# Patient Record
Sex: Female | Born: 1945 | Race: White | Hispanic: No
Health system: Southern US, Community
[De-identification: ages and names within clinical notes are randomized; demographics above are authoritative.]

## PROBLEM LIST (undated history)

## (undated) DIAGNOSIS — I2510 Atherosclerotic heart disease of native coronary artery without angina pectoris: Secondary | ICD-10-CM

## (undated) DIAGNOSIS — K219 Gastro-esophageal reflux disease without esophagitis: Secondary | ICD-10-CM

## (undated) DIAGNOSIS — D649 Anemia, unspecified: Secondary | ICD-10-CM

## (undated) DIAGNOSIS — C569 Malignant neoplasm of unspecified ovary: Secondary | ICD-10-CM

## (undated) DIAGNOSIS — I10 Essential (primary) hypertension: Secondary | ICD-10-CM

## (undated) DIAGNOSIS — E119 Type 2 diabetes mellitus without complications: Secondary | ICD-10-CM

## (undated) HISTORY — DX: Type 2 diabetes mellitus without complications: E11.9

## (undated) HISTORY — DX: Anemia, unspecified: D64.9

## (undated) HISTORY — DX: Atherosclerotic heart disease of native coronary artery without angina pectoris: I25.10

## (undated) HISTORY — DX: Essential (primary) hypertension: I10

## (undated) HISTORY — DX: Gastro-esophageal reflux disease without esophagitis: K21.9

## (undated) HISTORY — DX: Malignant neoplasm of unspecified ovary: C56.9

---

## 2015-04-24 ENCOUNTER — Ambulatory Visit: Payer: Self-pay | Admitting: Allergy and Immunology

## 2015-05-22 ENCOUNTER — Ambulatory Visit: Payer: Medicare Other | Admitting: Allergy and Immunology

## 2020-12-17 DIAGNOSIS — R1900 Intra-abdominal and pelvic swelling, mass and lump, unspecified site: Secondary | ICD-10-CM | POA: Insufficient documentation

## 2020-12-17 DIAGNOSIS — R971 Elevated cancer antigen 125 [CA 125]: Secondary | ICD-10-CM | POA: Insufficient documentation

## 2020-12-17 DIAGNOSIS — I10 Essential (primary) hypertension: Secondary | ICD-10-CM | POA: Insufficient documentation

## 2020-12-17 DIAGNOSIS — K219 Gastro-esophageal reflux disease without esophagitis: Secondary | ICD-10-CM | POA: Insufficient documentation

## 2020-12-17 DIAGNOSIS — E119 Type 2 diabetes mellitus without complications: Secondary | ICD-10-CM | POA: Insufficient documentation

## 2020-12-17 DIAGNOSIS — I779 Disorder of arteries and arterioles, unspecified: Secondary | ICD-10-CM | POA: Insufficient documentation

## 2020-12-20 DIAGNOSIS — D62 Acute posthemorrhagic anemia: Secondary | ICD-10-CM | POA: Insufficient documentation

## 2020-12-21 DIAGNOSIS — R531 Weakness: Secondary | ICD-10-CM | POA: Insufficient documentation

## 2020-12-21 DIAGNOSIS — R6889 Other general symptoms and signs: Secondary | ICD-10-CM | POA: Insufficient documentation

## 2021-01-07 DIAGNOSIS — C562 Malignant neoplasm of left ovary: Secondary | ICD-10-CM | POA: Insufficient documentation

## 2021-01-29 DIAGNOSIS — Z1379 Encounter for other screening for genetic and chromosomal anomalies: Secondary | ICD-10-CM | POA: Insufficient documentation

## 2021-01-29 NOTE — Progress Notes (Signed)
[address], [place] [address]   CLINIC:  Medical Oncology/Hematology  CONSULT NOTE  Patient Care Team: Derek Jack, MD as Medical Oncologist (Medical Oncology) Brien Mates, RN as Oncology Nurse Navigator (Oncology) [place], Tawni Pummel, NP as Nurse Practitioner (Cardiothoracic Surgery)  CHIEF COMPLAINTS/PURPOSE OF CONSULTATION:  Evaluation of ovarian cancer  HISTORY OF PRESENTING ILLNESS:  [name] 75 y.o. female is here because of evaluation of ovarian cancer, at the request of Dr. Denman George.  Today she reports feeling well, and she is accompanied by her daughter-in-law. She reports back pain over the past 3-4 days which is worsened with movement, but it is unchanged upon inspiration. She underwent exploratory laparotomy, BSO, and infra-gastric omentectomy on 12/19/2020 with Dr. Denman George. She currently undergoes physical therapy twice weekly, and she is expected to complete physical therapy in 2 weeks. She is able to walk with the assistance of a cane. She reports abdominal pain and swelling at the site of her abdominal surgical scar. She denies previous personal history of cancer. She reports occasional numbness in her hands. She denies history of MI and CVA. She reports swellings in her ankles.   She currently lives at home alone, and she is able to do all of her typical home activities unassisted. Prior to retirement she worked at [employer] in [place]. She denies extensive smoking history. Her brother passed from prostate cancer, another brother has throat cancer, her grandson had bladder cancer at 58 years old, her paternal uncle was a smoker and had neck cancer.   MEDICAL HISTORY:  Past Medical History:  Diagnosis Date   Anemia    CAD (coronary artery disease)    DM2 (diabetes mellitus, type 2) (HCC)    GERD (gastroesophageal reflux disease)    HTN (hypertension)    Ovarian ca ([place])     SURGICAL HISTORY: History  reviewed. No pertinent surgical history.  SOCIAL HISTORY: Social History   Socioeconomic History   Marital status: Single    Spouse name: Not on file   Number of children: Not on file   Years of education: Not on file   Highest education level: Not on file  Occupational History   Not on file  Tobacco Use   Smoking status: Not on file   Smokeless tobacco: Not on file  Substance and Sexual Activity   Alcohol use: Not on file   Drug use: Not on file   Sexual activity: Not on file  Other Topics Concern   Not on file  Social History Narrative   Not on file   Social Determinants of Health   Financial Resource Strain: Not on file  Food Insecurity: Not on file  Transportation Needs: Not on file  Physical Activity: Not on file  Stress: Not on file  Social Connections: Not on file  Intimate Partner Violence: Not on file    FAMILY HISTORY: History reviewed. No pertinent family history.  ALLERGIES:  is allergic to aspirin, iodinated diagnostic agents, naproxen sodium, penicillin g, codeine, and ibuprofen.  MEDICATIONS:  Current Outpatient Medications  Medication Sig Dispense Refill   acetaminophen (TYLENOL) 325 MG tablet Take 650 mg by mouth every 6 (six) hours as needed.     atorvastatin (LIPITOR) 40 MG tablet Take 40 mg by mouth daily.     azelastine (ASTELIN) 0.1 % nasal spray Place 1 spray into the nose in the morning and at bedtime.     baclofen (LIORESAL) 10 MG tablet Take 5 mg  by mouth in the morning, at noon, and at bedtime.     clopidogrel (PLAVIX) 75 MG tablet Take 75 mg by mouth daily.     furosemide (LASIX) 20 MG tablet Take 20 mg by mouth daily as needed.     insulin degludec (TRESIBA FLEXTOUCH) 100 UNIT/ML FlexTouch Pen Inject 19 Units into the skin at bedtime.     predniSONE (DELTASONE) 50 MG tablet Take 13hrs, 7hrs and 1hr before port placement 3 tablet 0   BANOPHEN 25 MG capsule Take by mouth.     FARXIGA 10 MG TABS tablet Take 10 mg by mouth daily.      ferrous sulfate 325 (65 FE) MG tablet Take 325 mg by mouth daily.     JANUVIA 100 MG tablet Take 100 mg by mouth daily.     losartan (COZAAR) 50 MG tablet Take 50 mg by mouth daily.     metFORMIN (GLUCOPHAGE) 1000 MG tablet Take 1,000 mg by mouth daily.     mometasone (ELOCON) 0.1 % cream SMARTSIG:Sparingly Topical Daily     MYRBETRIQ 50 MG TB24 tablet Take 50 mg by mouth daily.     oxyCODONE (OXY IR/ROXICODONE) 5 MG immediate release tablet Take 5 mg by mouth 4 (four) times daily as needed.     pantoprazole (PROTONIX) 20 MG tablet Take 20 mg by mouth daily.     traMADol (ULTRAM) 50 MG tablet Take by mouth.     triamcinolone cream (KENALOG) 0.1 % APPLY TO AFFECTED AREA TWICE A DAY FOR 7 DAYS     No current facility-administered medications for this visit.    REVIEW OF SYSTEMS:   Review of Systems  Cardiovascular:  Positive for leg swelling.  Gastrointestinal:  Positive for abdominal pain.  Musculoskeletal:  Positive for back pain.  Neurological:  Positive for numbness (occasional in hands).  All other systems reviewed and are negative.   PHYSICAL EXAMINATION: ECOG PERFORMANCE STATUS: 1 - Symptomatic but completely ambulatory  Vitals:   01/30/21 1410  BP: (!) 157/75  Pulse: 83  Resp: 17  Temp: (!) 96.8 F (36 C)  SpO2: 100%   Filed Weights   01/30/21 1410  Weight: 180 lb 12.8 oz (82 kg)   Physical Exam Vitals reviewed.  Constitutional:      Appearance: Normal appearance. She is obese.  Cardiovascular:     Rate and Rhythm: Normal rate and regular rhythm.     Pulses: Normal pulses.     Heart sounds: Normal heart sounds.  Pulmonary:     Effort: Pulmonary effort is normal.     Breath sounds: Normal breath sounds.  Abdominal:     General: A surgical scar is present.     Palpations: There is no mass.     Comments: Surgical scar extending below umbilicus to suprapubic region  Infraumbilical hematoma extending 6 cm in midline and 3 cm width   Musculoskeletal:     Right  lower leg: Edema (trace) present.     Left lower leg: Edema (trace) present.     Comments: Site of pain: upper left posterior ribs medial to left scapula  Neurological:     General: No focal deficit present.     Mental Status: She is alert and oriented to person, place, and time.  Psychiatric:        Mood and Affect: Mood normal.        Behavior: Behavior normal.     LABORATORY DATA:  I have reviewed the data as listed No  flowsheet data found. No flowsheet data found.  RADIOGRAPHIC STUDIES: I have personally reviewed the radiological images as listed and agreed with the findings in the report. No results found.  ASSESSMENT:  Stage IIIb high-grade serous ovarian carcinoma: - Presentation with abdominal distention and bloating sensation. - CT abdomen at outside hospital on 12/05/2020 with findings concerning for ovarian neoplasm. - CT chest without contrast on 12/18/2020 with scattered solid pulmonary nodules measuring up to 3 mm, indeterminate. - CA125 on 12/05/2020-10000 - 12/18/2020 exploratory laparotomy, bilateral salpingo-oophorectomy, infra gastric omentectomy, R0 primary tumor debulking by Dr. Denman George at [facility]. - Pathology-left ovary high-grade serous carcinoma, greatest dimension 26.5 cm, left ovarian capsule intact.  Fallopian tube surface involvement present, omentum involvement present, largest extrapelvic peritoneal focus-macroscopic, peritoneal/ascitic fluid involvement negative.  PT3BPNX.  FIGO stage IIIb. - She was evaluated for FLORA 5 clinical trial, found not to be a candidate. - Germline mutation testing was negative.   Social/family history: - She is seen with her daughter-in-law today.  She lives by herself.  She ambulates with the help of cane.  She retired after working at [employer].  She is non-smoker. - Brother had prostate cancer.  Another brother had throat cancer.  Sister had kidney cancer.  Grandson had bladder cancer at age 65.  Paternal uncle had  head and neck cancer.   PLAN:  Stage IIIb high-grade serous ovarian carcinoma: - She is recovering well after debulking surgery. - She reported some soreness at the incision site, mostly towards the right side and felt that hematoma has gotten slightly bigger. - She is having a CT scan done in [place] tomorrow to further evaluate this. - She also reported upper back pain for the last 1 week.  She has a tenderness medial to the left scapula which hurts on certain movements.  No pain on deep inspiration.  This is likely musculoskeletal pain. - She reports doing physical therapy twice weekly and has 2 more weeks to complete. - We discussed adjuvant chemotherapy to decrease chance of recurrence given high-grade serous histology.  Unfortunately she was not eligible for clinical trial.  Hence we will proceed with carboplatin and paclitaxel every 3 weeks for 6 cycles with or without maintenance therapy. - She was already tested negative for germline mutation testing.  We will consider testing for somatic mutations. - We will recommend port placement as soon as possible and initiate chemotherapy immediately after port placement. - She does not report any baseline neuropathy.  She has diabetes for 7 years.  2.  Carotid artery disease: - He underwent carotid endarterectomy in 2021.  She is on Plavix 75 mg daily.  3.  Type 2 diabetes: - Currently on metformin, Januvia and Tresiba.   All questions were answered. The patient knows to call the clinic with any problems, questions or concerns.   Derek Jack, MD, 01/30/21 4:59 PM  [place] [phone]   I, Thana Ates, am acting as a scribe for Dr. Derek Jack.  I, Derek Jack MD, have reviewed the above documentation for accuracy and completeness, and I agree with the above.

## 2021-01-30 ENCOUNTER — Encounter (HOSPITAL_COMMUNITY): Payer: Self-pay | Admitting: Hematology

## 2021-01-30 ENCOUNTER — Inpatient Hospital Stay (HOSPITAL_COMMUNITY): Payer: Medicare Other | Attending: Hematology | Admitting: Hematology

## 2021-01-30 ENCOUNTER — Other Ambulatory Visit: Payer: Self-pay

## 2021-01-30 VITALS — BP 157/75 | HR 83 | Temp 96.8°F | Resp 17 | Ht 61.0 in | Wt 180.8 lb

## 2021-01-30 DIAGNOSIS — Z79899 Other long term (current) drug therapy: Secondary | ICD-10-CM | POA: Diagnosis not present

## 2021-01-30 DIAGNOSIS — I2510 Atherosclerotic heart disease of native coronary artery without angina pectoris: Secondary | ICD-10-CM | POA: Diagnosis not present

## 2021-01-30 DIAGNOSIS — Z7902 Long term (current) use of antithrombotics/antiplatelets: Secondary | ICD-10-CM | POA: Diagnosis not present

## 2021-01-30 DIAGNOSIS — E119 Type 2 diabetes mellitus without complications: Secondary | ICD-10-CM | POA: Insufficient documentation

## 2021-01-30 DIAGNOSIS — I779 Disorder of arteries and arterioles, unspecified: Secondary | ICD-10-CM | POA: Diagnosis not present

## 2021-01-30 DIAGNOSIS — C562 Malignant neoplasm of left ovary: Secondary | ICD-10-CM

## 2021-01-30 MED ORDER — PREDNISONE 50 MG PO TABS: ORAL_TABLET | ORAL | 0 refills | Status: DC

## 2021-01-30 NOTE — Patient Instructions (Signed)
[place] at [facility] Discharge Instructions  You were seen and examined today by Dr. Delton Coombes. Dr. Delton Coombes is a medical oncologist, meaning that he specializes in the management of cancer diagnoses with medications. Dr. Delton Coombes discussed your past medical history, family history of cancer, and the events that led to you being here today.  You were referred to Dr. Delton Coombes by Dr. Denman George due to your new diagnosis of Ovarian Cancer. It is considered a Stage Cancer, and when examined under a microscope it was identified as a high-grade serous carcinoma. It is recommended that you have 6 cycles of chemotherapy.  Chemotherapy would be given here in the clinic once every 3 weeks.   Thank you for choosing [place] at [facility] to provide your oncology and hematology care.  To afford each patient quality time with our provider, please arrive at least 15 minutes before your scheduled appointment time.   If you have a lab appointment with the [facility] please come in thru the Main Entrance and check in at the main information desk.  You need to re-schedule your appointment should you arrive 10 or more minutes late.  We strive to give you quality time with our providers, and arriving late affects you and other patients whose appointments are after yours.  Also, if you no show three or more times for appointments you may be dismissed from the clinic at the providers discretion.     Again, thank you for choosing [facility].  Our hope is that these requests will decrease the amount of time that you wait before being seen by our physicians.       _____________________________________________________________  Should you have questions after your visit to [facility], please contact our office at [phone] and follow the prompts.  Our office hours are 8:00 a.m. and 4:30 p.m. Monday - Friday.  Please note that  voicemails left after 4:00 p.m. may not be returned until the following business day.  We are closed weekends and major holidays.  You do have access to a nurse 24-7, just call the main number to the clinic [phone] and do not press any options, hold on the line and a nurse will answer the phone.    For prescription refill requests, have your pharmacy contact our office and allow 72 hours.    Due to Covid, you will need to wear a mask upon entering the hospital. If you do not have a mask, a mask will be given to you at the Main Entrance upon arrival. For doctor visits, patients may have 1 support person age 22 or older with them. For treatment visits, patients can not have anyone with them due to social distancing guidelines and our immunocompromised population.

## 2021-01-30 NOTE — Progress Notes (Signed)
START ON PATHWAY REGIMEN - Ovarian     A cycle is every 21 days:     Paclitaxel      Carboplatin   **Always confirm dose/schedule in your pharmacy ordering system**  Patient Characteristics: Postoperative without Neoadjuvant Therapy (Pathologic Staging), Newly Diagnosed, Adjuvant Therapy, Stage IIB and Stage IIIA/B/C Optimal Cytoreduction BRCA Mutation Status: Awaiting Test Results Therapeutic Status: Postoperative without Neoadjuvant Therapy (Pathologic Staging) AJCC 8 Stage Grouping: IIIB AJCC M Category: cM0 AJCC T Category: pT3b AJCC N Category: pNX Intent of Therapy: Curative Intent, Discussed with Patient

## 2021-02-02 ENCOUNTER — Encounter (HOSPITAL_COMMUNITY): Payer: Self-pay

## 2021-02-02 ENCOUNTER — Encounter (HOSPITAL_COMMUNITY): Payer: Self-pay | Admitting: Hematology

## 2021-02-02 DIAGNOSIS — Z95828 Presence of other vascular implants and grafts: Secondary | ICD-10-CM

## 2021-02-02 HISTORY — DX: Presence of other vascular implants and grafts: Z95.828

## 2021-02-02 MED ORDER — LIDOCAINE-PRILOCAINE 2.5-2.5 % EX CREA: TOPICAL_CREAM | CUTANEOUS | 3 refills | Status: DC

## 2021-02-02 MED ORDER — PROCHLORPERAZINE MALEATE 10 MG PO TABS: 10.0000 mg | ORAL_TABLET | Freq: Four times a day (QID) | ORAL | 1 refills | Status: DC | PRN

## 2021-02-02 NOTE — Patient Instructions (Signed)
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  You will be treated in the clinic every 3 weeks with a combination of chemotherapy drugs.  Those drugs are paclitaxel (Taxol) and carboplatin.  The plan is for you to complete 6 cycles (treatment every 3 weeks x 6).  The intent of treatment is cure.  You will see the doctor regularly throughout treatment.  We will obtain blood work from you prior to every treatment and monitor your results to make sure it is safe to give your treatment. The doctor monitors your response to treatment by the way you are feeling, your blood work, and by obtaining scans periodically.  There will be wait times while you are here for treatment.  It will take about 30 minutes to 1 hour for your lab work to result.  Then there will be wait times while pharmacy mixes your medications.    Medications you will receive in the clinic prior to your chemotherapy medications:   Aloxi:  ALOXI is used in adults to help prevent the nausea and vomiting that happens with certain chemotherapy drugs.  Aloxi is a long acting medication, and will remain in your system for about 2 days.    Dexamethasone:  This is a steroid given prior to chemotherapy to help prevent allergic reactions; it may also help prevent and control nausea and diarrhea.    Pepcid:  This medication is a histamine blocker that helps prevent and allergic reaction to your chemotherapy.    Benadryl:  This is a histamine blocker (different from the Pepcid) that helps prevent allergic/infusion reactions to your chemotherapy. This medication may cause dizziness/drowsiness.      Paclitaxel (Taxol)  About This Drug Paclitaxel is a drug used to treat cancer. It is given in the vein (IV).  This will take 3 hours to infuse.  This first infusion will take longer to infuse because it is increased slowly to monitor for reactions.  The nurse will be in  the room with you for the first 15 minutes of the first infusion.  Possible Side Effects   Hair loss. Hair loss is often temporary, although with certain medicine, hair loss can sometimes be permanent. Hair loss may happen suddenly or gradually. If you lose hair, you may lose it from your head, face, armpits, pubic area, chest, and/or legs. You may also notice your hair getting thin.   Swelling of your legs, ankles and/or feet (edema)   Flushing   Nausea and throwing up (vomiting)   Loose bowel movements (diarrhea)   Bone marrow depression. This is a decrease in the number of white blood cells, red blood cells, and platelets. This may raise your risk of infection, make you tired and weak (fatigue), and raise your risk of bleeding.   Effects on the nerves are called peripheral neuropathy. You may feel numbness, tingling, or pain in your hands and feet. It may be hard for you to button your clothes, open jars, or walk as usual. The effect on the nerves may get worse with more doses of the drug. These effects get better in some people after the drug is stopped but it does not get better in all people.   Changes in your liver function   Bone, joint and muscle pain   Abnormal EKG   Allergic reaction: Allergic reactions, including anaphylaxis are rare but may happen in some patients. Signs of allergic reaction to this drug  may be swelling of the face, feeling like your tongue or throat are swelling, trouble breathing, rash, itching, fever, chills, feeling dizzy, and/or feeling that your heart is beating in a fast or not normal way. If this happens, do not take another dose of this drug. You should get urgent medical treatment.   Infection   Changes in your kidney function.  Note: Each of the side effects above was reported in 20% or greater of patients treated with paclitaxel. Not all possible side effects are included above.  Warnings and Precautions   Severe allergic reactions    Severe bone marrow depression  Treating Side Effects   To help with hair loss, wash with a mild shampoo and avoid washing your hair every day.   Avoid rubbing your scalp, instead, pat your hair or scalp dry   Avoid coloring your hair   Limit your use of hair spray, electric curlers, blow dryers, and curling irons.   If you are interested in getting a wig, talk to your nurse. You can also call the [facility] at 800-ACS-2345 to find out information about the "Look Good, Feel Better" program close to where you live. It is a free program where women getting chemotherapy can learn about wigs, turbans and scarves as well as makeup techniques and skin and nail care.   Ask your doctor or nurse about medicines that are available to help stop or lessen diarrhea and/or nausea.   To help with nausea and vomiting, eat small, frequent meals instead of three large meals a day. Choose foods and drinks that are at room temperature. Ask your nurse or doctor about other helpful tips and medicine that is available to help or stop lessen these symptoms.   If you get diarrhea, eat low-fiber foods that are high in protein and calories and avoid foods that can irritate your digestive tracts or lead to cramping. Ask your nurse or doctor about medicine that can lessen or stop your diarrhea.   Mouth care is very important. Your mouth care should consist of routine, gentle cleaning of your teeth or dentures and rinsing your mouth with a mixture of 1/2 teaspoon of salt in 8 ounces of water or  teaspoon of baking soda in 8 ounces of water. This should be done at least after each meal and at bedtime.   If you have mouth sores, avoid mouthwash that has alcohol. Also avoid alcohol and smoking because they can bother your mouth and throat.   Drink plenty of fluids (a minimum of eight glasses per day is recommended).   Take your temperature as your doctor or nurse tells you, and whenever you feel like you may  have a fever.   Talk to your doctor or nurse about precautions you can take to avoid infections and bleeding.   Be careful when cooking, walking, and handling sharp objects and hot liquids.  Food and Drug Interactions   There are no known interactions of paclitaxel with food.   This drug may interact with other medicines. Tell your doctor and pharmacist about all the medicines and dietary supplements (vitamins, minerals, herbs and others) that you are taking at this time.   The safety and use of dietary supplements and alternative diets are often not known. Using these might affect your cancer or interfere with your treatment. Until more is known, you should not use dietary supplements or alternative diets without your cancer doctor's help.  When to Call the Doctor  Call your  doctor or nurse if you have any of the following symptoms and/or any new or unusual symptoms:   Fever of 100.4 F (38 C) or above   Chills   Redness, pain, warmth, or swelling at the IV site during the infusion   Signs of allergic reaction: swelling of the face, feeling like your tongue or throat are swelling, trouble breathing, rash, itching, fever, chills, feeling dizzy, and/or feeling that your heart is beating in a fast or not normal way   Feeling that your heart is beating in a fast or not normal way (palpitations)   Weight gain of 5 pounds in one week (fluid retention)   Decreased urine or very dark urine   Signs of liver problems: dark urine, pale bowel movements, bad stomach pain, feeling very tired and weak, unusual  itching, or yellowing of the eyes or skin   Heavy menstrual period that lasts longer than normal   Easy bruising or bleeding   Nausea that stops you from eating or drinking, and/or that is not relieved by prescribed medicines.   Loose bowel movements (diarrhea) more than 4 times a day or diarrhea with weakness or lightheadedness   Pain in your mouth or throat that makes it hard to  eat or drink   Lasting loss of appetite or rapid weight loss of five pounds in a week   Signs of peripheral neuropathy: numbness, tingling, or decreased feeling in fingers or toes; trouble walking or changes in the way you walk; or feeling clumsy when buttoning clothes, opening jars, or other routine activities   Joint and muscle pain that is not relieved by prescribed medicines   Extreme fatigue that interferes with normal activities   While you are getting this drug, please tell your nurse right away if you have any pain, redness, or swelling at the site of the IV infusion.   If you think you are pregnant.  Reproduction Warnings   Pregnancy warning: This drug may have harmful effects on the unborn child, it is recommended that effective methods of birth control should be used during your cancer treatment. Let your doctor know right away if you think you may be pregnant.   Breast feeding warning: Women should not breast feed during treatment because this drug could enter the breastmilk and cause harm to a breast feeding baby.   Carboplatin (Paraplatin, CBDCA)  About This Drug  Carboplatin is used to treat cancer. It is given in the vein (IV).  It will take 30 minutes to infuse.   Possible Side Effects   Bone marrow suppression. This is a decrease in the number of white blood cells, red blood cells, and platelets. This may raise your risk of infection, make you tired and weak (fatigue), and raise your risk of bleeding.   Nausea and vomiting (throwing up)   Weakness   Changes in your liver function   Changes in your kidney function   Electrolyte changes   Pain  Note: Each of the side effects above was reported in 20% or greater of patients treated with carboplatin. Not all possible side effects are included above.   Warnings and Precautions   Severe bone marrow suppression   Allergic reactions, including anaphylaxis are rare but may happen in some patients. Signs of  allergic reaction to this drug may be swelling of the face, feeling like your tongue or throat are swelling, trouble breathing, rash, itching, fever, chills, feeling dizzy, and/or feeling that your heart is beating in a  fast or not normal way. If this happens, do not take another dose of this drug. You should get urgent medical treatment.   Severe nausea and vomiting   Effects on the nerves are called peripheral neuropathy. This risk is increased if you are over the age of 54 or if you have received other medicine with risk of peripheral neuropathy. You may feel numbness, tingling, or pain in your hands and feet. It may be hard for you to button your clothes, open jars, or walk as usual. The effect on the nerves may get worse with more doses of the drug. These effects get better in some people after the drug is stopped but it does not get better in all people.   Blurred vision, loss of vision or other changes in eyesight   Decreased hearing   - Skin and tissue irritation including redness, pain, warmth, or swelling at the IV site if the drug leaks out of the vein and into nearby tissue.   Severe changes in your kidney function, which can cause kidney failure   Severe changes in your liver function, which can cause liver failure  Note: Some of the side effects above are very rare. If you have concerns and/or questions, please discuss them with your medical team.   Important Information   This drug may be present in the saliva, tears, sweat, urine, stool, vomit, semen, and vaginal secretions. Talk to your doctor and/or your nurse about the necessary precautions to take during this time.   Treating Side Effects   Manage tiredness by pacing your activities for the day.   Be sure to include periods of rest between energy-draining activities.   To decrease the risk of infection, wash your hands regularly.   Avoid close contact with people who have a cold, the flu, or other infections.    Take your temperature as your doctor or nurse tells you, and whenever you feel like you may have a fever.   To help decrease the risk of bleeding, use a soft toothbrush. Check with your nurse before using dental floss.   Be very careful when using knives or tools.   Use an electric shaver instead of a razor.   Drink plenty of fluids (a minimum of eight glasses per day is recommended).   If you throw up or have loose bowel movements, you should drink more fluids so that you do not become dehydrated (lack of water in the body from losing too much fluid).   To help with nausea and vomiting, eat small, frequent meals instead of three large meals a day. Choose foods and drinks that are at room temperature. Ask your nurse or doctor about other helpful tips and medicine that is available to help stop or lessen these symptoms.   If you have numbness and tingling in your hands and feet, be careful when cooking, walking, and handling sharp objects and hot liquids.   Keeping your pain under control is important to your well-being. Please tell your doctor or nurse if you are experiencing pain.   Food and Drug Interactions   There are no known interactions of carboplatin with food.   This drug may interact with other medicines. Tell your doctor and pharmacist about all the prescription and over-the-counter medicines and dietary supplements (vitamins, minerals, herbs and others) that you are taking at this time. Also, check with your doctor or pharmacist before starting any new prescription or over-the-counter medicines, or dietary supplements to make sure that  there are no interactions.   When to Call the Doctor  Call your doctor or nurse if you have any of these symptoms and/or any new or unusual symptoms:   Fever of 100.4 F (38 C) or higher   Chills   Tiredness that interferes with your daily activities   Feeling dizzy or lightheaded   Easy bleeding or bruising   Nausea that stops you  from eating or drinking and/or is not relieved by prescribed medicines   Throwing up   Blurred vision or other changes in eyesight   Decrease in hearing or ringing in the ear   Signs of allergic reaction: swelling of the face, feeling like your tongue or throat are swelling, trouble breathing, rash, itching, fever, chills, feeling dizzy, and/or feeling that your heart is beating in a fast or not normal way. If this happens, call 911 for emergency care.   Signs of possible liver problems: dark urine, pale bowel movements, bad stomach pain, feeling very tired and weak, unusual itching, or yellowing of the eyes or skin   Decreased urine, or very dark urine   Numbness, tingling, or pain in your hands and feet   Pain that does not go away or is not relieved by prescribed medicine   While you are getting this drug, please tell your nurse right away if you have any pain, redness, or swelling at the site of the IV infusion, or if you have any new onset of symptoms, or if you just feel "different" from before when the infusion was started.   Reproduction Warnings   Pregnancy warning: This drug may have harmful effects on the unborn baby. Women of child bearing potential should use effective methods of birth control during your cancer treatment. Let your doctor know right away if you think you may be pregnant.   Breastfeeding warning: It is not known if this drug passes into breast milk. For this reason, women should not breastfeed during treatment because this drug could enter the breast milk and cause harm to a breastfeeding baby.   Fertility warning: Human fertility studies have not been done with this drug. Talk with your doctor or nurse if you plan to have children. Ask for information on sperm or egg banking.   SELF CARE ACTIVITIES WHILE RECEIVING CHEMOTHERAPY:  Hydration Increase your fluid intake 48 hours prior to treatment and drink at least 8 to 12 cups (64 ounces) of  water/decaffeinated beverages per day after treatment. You can still have your cup of coffee or soda but these beverages do not count as part of your 8 to 12 cups that you need to drink daily. No alcohol intake.  Medications Continue taking your normal prescription medication as prescribed.  If you start any new herbal or new supplements please let us know first to make sure it is safe.  Mouth Care Have teeth cleaned professionally before starting treatment. Keep dentures and partial plates clean. Use soft toothbrush and do not use mouthwashes that contain alcohol. Biotene is a good mouthwash that is available at most pharmacies or may be ordered by calling [phone]. Use warm salt water gargles (1 teaspoon salt per 1 quart warm water) before and after meals and at bedtime. If you need dental work, please let the doctor know before you go for your appointment so that we can coordinate the best possible time for you in regards to your chemo regimen. You need to also let your dentist know that you are actively taking  chemo. We may need to do labs prior to your dental appointment.  Skin Care Always use sunscreen that has not expired and with SPF (Sun Protection Factor) of 50 or higher. Wear hats to protect your head from the sun. Remember to use sunscreen on your hands, ears, face, & feet.  Use good moisturizing lotions such as udder cream, eucerin, or even Vaseline. Some chemotherapies can cause dry skin, color changes in your skin and nails.    Avoid long, hot showers or baths. Use gentle, fragrance-free soaps and laundry detergent. Use moisturizers, preferably creams or ointments rather than lotions because the thicker consistency is better at preventing skin dehydration. Apply the cream or ointment within 15 minutes of showering. Reapply moisturizer at night, and moisturize your hands every time after you wash them.  Hair Loss (if your doctor says your hair will fall out)  If your doctor says  that your hair is likely to fall out, decide before you begin chemo whether you want to wear a wig. You may want to shop before treatment to match your hair color. Hats, turbans, and scarves can also camouflage hair loss, although some people prefer to leave their heads uncovered. If you go bare-headed outdoors, be sure to use sunscreen on your scalp. Cut your hair short. It eases the inconvenience of shedding lots of hair, but it also can reduce the emotional impact of watching your hair fall out. Don't perm or color your hair during chemotherapy. Those chemical treatments are already damaging to hair and can enhance hair loss. Once your chemo treatments are done and your hair has grown back, it's OK to resume dyeing or perming hair.  With chemotherapy, hair loss is almost always temporary. But when it grows back, it may be a different color or texture. In older adults who still had hair color before chemotherapy, the new growth may be completely gray.  Often, new hair is very fine and soft.  Infection Prevention Please wash your hands for at least 30 seconds using warm soapy water. Handwashing is the #1 way to prevent the spread of germs. Stay away from sick people or people who are getting over a cold. If you develop respiratory systems such as green/yellow mucus production or productive cough or persistent cough let us know and we will see if you need an antibiotic. It is a good idea to keep a pair of gloves on when going into grocery stores/[employer] to decrease your risk of coming into contact with germs on the carts, etc. Carry alcohol hand gel with you at all times and use it frequently if out in public. If your temperature reaches 100.4 or higher please call the clinic and let us know.  If it is after hours or on the weekend please go to the ER if your temperature is over 100.4.  Please have your own personal thermometer at home to use.    Sex and bodily fluids If you are going to have sex, a  condom must be used to protect the person that isn't taking chemotherapy. Chemo can decrease your libido (sex drive). For a few days after chemotherapy, chemotherapy can be excreted through your bodily fluids.  When using the toilet please close the lid and flush the toilet twice.  Do this for a few day after you have had chemotherapy.   Effects of chemotherapy on your sex life Some changes are simple and won't last long. They won't affect your sex life permanently.  Sometimes you may  feel: too tired not strong enough to be very active sick or sore  not in the mood anxious or low  Your anxiety might not seem related to sex. For example, you may be worried about the cancer and how your treatment is going. Or you may be worried about money, or about how you family are coping with your illness.  These things can cause stress, which can affect your interest in sex. It's important to talk to your partner about how you feel.  Remember - the changes to your sex life don't usually last long. There's usually no medical reason to stop having sex during chemo. The drugs won't have any long term physical effects on your performance or enjoyment of sex. Cancer can't be passed on to your partner during sex  Contraception It's important to use reliable contraception during treatment. Avoid getting pregnant while you or your partner are having chemotherapy. This is because the drugs may harm the baby. Sometimes chemotherapy drugs can leave a man or woman infertile.  This means you would not be able to have children in the future. You might want to talk to someone about permanent infertility. It can be very difficult to learn that you may no longer be able to have children. Some people find counselling helpful. There might be ways to preserve your fertility, although this is easier for men than for women. You may want to speak to a fertility expert. You can talk about sperm banking or harvesting your eggs. You can  also ask about other fertility options, such as donor eggs. If you have or have had breast cancer, your doctor might advise you not to take the contraceptive pill. This is because the hormones in it might affect the cancer. It is not known for sure whether or not chemotherapy drugs can be passed on through semen or secretions from the vagina. Because of this some doctors advise people to use a barrier method if you have sex during treatment. This applies to vaginal, anal or oral sex. Generally, doctors advise a barrier method only for the time you are actually having the treatment and for about a week after your treatment. Advice like this can be worrying, but this does not mean that you have to avoid being intimate with your partner. You can still have close contact with your partner and continue to enjoy sex.  Animals If you have cats or birds we just ask that you not change the litter or change the cage.  Please have someone else do this for you while you are on chemotherapy.   Food Safety During and After Cancer Treatment Food safety is important for people both during and after cancer treatment. Cancer and cancer treatments, such as chemotherapy, radiation therapy, and stem cell/bone marrow transplantation, often weaken the immune system. This makes it harder for your body to protect itself from foodborne illness, also called food poisoning. Foodborne illness is caused by eating food that contains harmful bacteria, parasites, or viruses.  Foods to avoid Some foods have a higher risk of becoming tainted with bacteria. These include: Unwashed fresh fruit and vegetables, especially leafy vegetables that can hide dirt and other contaminants Raw sprouts, such as alfalfa sprouts Raw or undercooked beef, especially ground beef, or other raw or undercooked meat and poultry Fatty, fried, or spicy foods immediately before or after treatment.  These can sit heavy on your stomach and make you feel  nauseous. Raw or undercooked shellfish, such as oysters. Sushi and  sashimi, which often contain raw fish.  Unpasteurized beverages, such as unpasteurized fruit juices, raw milk, raw yogurt, or cider Undercooked eggs, such as soft boiled, over easy, and poached; raw, unpasteurized eggs; or foods made with raw egg, such as homemade raw cookie dough and homemade mayonnaise  Simple steps for food safety  Shop smart. Do not buy food stored or displayed in an unclean area. Do not buy bruised or damaged fruits or vegetables. Do not buy cans that have cracks, dents, or bulges. Pick up foods that can spoil at the end of your shopping trip and store them in a cooler on the way home.  Prepare and clean up foods carefully. Rinse all fresh fruits and vegetables under running water, and dry them with a clean towel or paper towel. Clean the top of cans before opening them. After preparing food, wash your hands for 20 seconds with hot water and soap. Pay special attention to areas between fingers and under nails. Clean your utensils and dishes with hot water and soap. Disinfect your kitchen and cutting boards using 1 teaspoon of liquid, unscented bleach mixed into 1 quart of water.    Dispose of old food. Eat canned and packaged food before its expiration date (the "use by" or "best before" date). Consume refrigerated leftovers within 3 to 4 days. After that time, throw out the food. Even if the food does not smell or look spoiled, it still may be unsafe. Some bacteria, such as Listeria, can grow even on foods stored in the refrigerator if they are kept for too long.  Take precautions when eating out. At restaurants, avoid buffets and salad bars where food sits out for a long time and comes in contact with many people. Food can become contaminated when someone with a virus, often a norovirus, or another "bug" handles it. Put any leftover food in a "to-go" container yourself, rather than having the server  do it. And, refrigerate leftovers as soon as you get home. Choose restaurants that are clean and that are willing to prepare your food as you order it cooked.   AT HOME MEDICATIONS:                                                                                                                                                                Compazine/Prochlorperazine 10mg  tablet. Take 1 tablet every 6 hours as needed for nausea/vomiting. (This can make you sleepy)   EMLA cream. Apply a quarter size amount to port site 1 hour prior to chemo. Do not rub in. Cover with plastic wrap.    Diarrhea Sheet   If you are having loose stools/diarrhea, please purchase Imodium and begin taking as outlined:  At the first sign of poorly formed or loose stools you  should begin taking Imodium (loperamide) 2 mg capsules.  Take two tablets (4mg ) followed by one tablet (2mg ) every 2 hours - DO NOT EXCEED 8 tablets in 24 hours.  If it is bedtime and you are having loose stools, take 2 tablets at bedtime, then 2 tablets every 4 hours until morning.   Always call the [facility] if you are having loose stools/diarrhea that you can't get under control.  Loose stools/diarrhea leads to dehydration (loss of water) in your body.  We have other options of trying to get the loose stools/diarrhea to stop but you must let us know!   Constipation Sheet  Colace - 100 mg capsules - take 2 capsules daily.  If this doesn't help then you can increase to 2 capsules twice daily.  Please call if the above does not work for you. Do not go more than 2 days without a bowel movement.  It is very important that you do not become constipated.  It will make you feel sick to your stomach (nausea) and can cause abdominal pain and vomiting.  Nausea Sheet   Compazine/Prochlorperazine 10mg  tablet. Take 1 tablet every 6 hours as needed for nausea/vomiting (This can make you drowsy).  If you are having persistent nausea (nausea that does not  stop) please call the [facility] and let us know the amount of nausea that you are experiencing.  If you begin to vomit, you need to call the [facility] and if it is the weekend and you have vomited more than one time and can't get it to stop-go to the Emergency Room.  Persistent nausea/vomiting can lead to dehydration (loss of fluid in your body) and will make you feel very weak and unwell. Ice chips, sips of clear liquids, foods that are at room temperature, crackers, and toast tend to be better tolerated.   SYMPTOMS TO REPORT AS SOON AS POSSIBLE AFTER TREATMENT:  FEVER GREATER THAN 100.4 F  CHILLS WITH OR WITHOUT FEVER  NAUSEA AND VOMITING THAT IS NOT CONTROLLED WITH YOUR NAUSEA MEDICATION  UNUSUAL SHORTNESS OF BREATH  UNUSUAL BRUISING OR BLEEDING  TENDERNESS IN MOUTH AND THROAT WITH OR WITHOUT PRESENCE OF ULCERS  URINARY PROBLEMS  BOWEL PROBLEMS  UNUSUAL RASH      Wear comfortable clothing and clothing appropriate for easy access to any Portacath or PICC line. Let us know if there is anything that we can do to make your therapy better!    What to do if you need assistance after hours or on the weekends: CALL [phone].  HOLD on the line, do not hang up.  You will hear multiple messages but at the end you will be connected with a nurse triage line.  They will contact the doctor if necessary.  Most of the time they will be able to assist you.  Do not call the hospital operator.      I have been informed and understand all of the instructions given to me and have received a copy. I have been instructed to call the clinic [phone] or my family physician as soon as possible for continued medical care, if indicated. I do not have any more questions at this time but understand that I may call the [facility] or the Patient Navigator at [phone] during office hours should I have questions or need assistance in obtaining follow-up care.

## 2021-02-04 ENCOUNTER — Telehealth (HOSPITAL_COMMUNITY): Payer: Self-pay | Admitting: *Deleted

## 2021-02-04 NOTE — Telephone Encounter (Signed)
Daughter in law - Jeral Fruit, who is listed on her emergency contacts called to advise that any information given to patient needs to be forwarded to either she or son Elenore Rota.  Advised that we will do our best to make this happen, however encouraged them to contact us with any clarification needed at any time.

## 2021-02-05 ENCOUNTER — Inpatient Hospital Stay (HOSPITAL_COMMUNITY): Payer: Medicare Other | Attending: Hematology | Admitting: General Practice

## 2021-02-05 DIAGNOSIS — I779 Disorder of arteries and arterioles, unspecified: Secondary | ICD-10-CM | POA: Insufficient documentation

## 2021-02-05 DIAGNOSIS — C562 Malignant neoplasm of left ovary: Secondary | ICD-10-CM | POA: Insufficient documentation

## 2021-02-05 DIAGNOSIS — Z7902 Long term (current) use of antithrombotics/antiplatelets: Secondary | ICD-10-CM | POA: Insufficient documentation

## 2021-02-05 DIAGNOSIS — E1142 Type 2 diabetes mellitus with diabetic polyneuropathy: Secondary | ICD-10-CM | POA: Insufficient documentation

## 2021-02-05 DIAGNOSIS — Z5111 Encounter for antineoplastic chemotherapy: Secondary | ICD-10-CM | POA: Insufficient documentation

## 2021-02-05 DIAGNOSIS — Z79899 Other long term (current) drug therapy: Secondary | ICD-10-CM | POA: Insufficient documentation

## 2021-02-05 DIAGNOSIS — Z7984 Long term (current) use of oral hypoglycemic drugs: Secondary | ICD-10-CM | POA: Insufficient documentation

## 2021-02-05 DIAGNOSIS — D649 Anemia, unspecified: Secondary | ICD-10-CM | POA: Insufficient documentation

## 2021-02-05 NOTE — Progress Notes (Signed)
[place] Work  Initial Assessment   Rebecca [name] is a 75 y.o. year old female contacted by phone. Unable to reach patient at either number and could not leave message - no VM, spoke with daughter in law.  Per daughter in law, home phone does not work.  Clinical Social Work was referred by medical oncology for assessment of psychosocial needs.   SDOH (Social Determinants of Health) assessments performed: Yes SDOH Interventions    Flowsheet Row Most Recent Value  SDOH Interventions   Food Insecurity Interventions Intervention Not Indicated  Financial Strain Interventions Intervention Not Indicated  Housing Interventions Intervention Not Indicated  Stress Interventions Intervention Not Indicated  Social Connections Interventions Intervention Not Indicated       Distress Screen completed: Yes No flowsheet data found.    Family/Social Information:  Housing Arrangement: patient lives alone son and daughter in law live in [place] Family members/support persons in your life? Son and daughter in law are engaged but patient is fully independent and makes her own decisions.  Transportation concerns: yes, per daughter in law, patient appears to need help with transportation post surgery.  Family is transporting.    Employment: Retired. Has not worked in 39 years or so.  Income source: Paediatric nurse concerns: No Type of concern: None Food access concerns: no, has Physicist, medical Religious or spiritual practice: used to attend [employer] which has now closed Medication Concerns: no, keeps up with her own medications  Services Currently in place:  has Medicare and Medicaid.    Coping/ Adjustment to diagnosis: Patient understands treatment plan and what happens next? yes, diagnosed with ovarian cancer (" 10 pound tumor"), has had surgery and is currently in chemotherapy now.   Concerns about diagnosis and/or treatment:  has difficulty with social isolation due to  illness  Patient reported stressors:  death of significant friend approx 2 years ago - may struggle with some mild depression,friend used to live in her house, now she lives alone.   Hopes and priorities: unable to assess Patient enjoys  watch TV, puzzles, used to like to host dinners for family and friends.   Used to like to garden.    Current coping skills/ strengths: Supportive family/friends     SUMMARY: Current SDOH Barriers:  Limited social support  Interventions: Discussed common feeling and emotions when being diagnosed with cancer, and the importance of support during treatment Informed patient of the support team roles and support services at [facility] Provided CSW contact information and encouraged patient to call with any questions or concerns Referred patient to [facility]   Follow Up Plan: Patient will contact CSW with any support or resource needs Patient verbalizes understanding of plan: Yes    [facility] [place] Work, [place], [place] Social Worker Phone:  [phone]

## 2021-02-06 NOTE — Progress Notes (Signed)
Pharmacist Chemotherapy Monitoring - Initial Assessment    Anticipated start date: 02/11/21   The following has been reviewed per standard work regarding the patient's treatment regimen: The patient's diagnosis, treatment plan and drug doses, and organ/hematologic function Lab orders and baseline tests specific to treatment regimen  The treatment plan start date, drug sequencing, and pre-medications Prior authorization status  Patient's documented medication list, including drug-drug interaction screen and prescriptions for anti-emetics and supportive care specific to the treatment regimen The drug concentrations, fluid compatibility, administration routes, and timing of the medications to be used The patient's access for treatment and lifetime cumulative dose history, if applicable  The patient's medication allergies and previous infusion related reactions, if applicable   Changes made to treatment plan:  N/A  Follow up needed:  N/A   Judge Stall, RPH, 02/06/2021  1:49 PM

## 2021-02-07 ENCOUNTER — Inpatient Hospital Stay (HOSPITAL_COMMUNITY): Payer: Medicare Other

## 2021-02-07 ENCOUNTER — Other Ambulatory Visit: Payer: Self-pay | Admitting: Radiology

## 2021-02-07 ENCOUNTER — Other Ambulatory Visit: Payer: Self-pay

## 2021-02-07 DIAGNOSIS — C562 Malignant neoplasm of left ovary: Secondary | ICD-10-CM

## 2021-02-07 DIAGNOSIS — Z95828 Presence of other vascular implants and grafts: Secondary | ICD-10-CM

## 2021-02-07 NOTE — Progress Notes (Signed)

## 2021-02-08 ENCOUNTER — Encounter (HOSPITAL_COMMUNITY): Payer: Self-pay | Admitting: *Deleted

## 2021-02-08 ENCOUNTER — Ambulatory Visit (HOSPITAL_COMMUNITY)
Admission: RE | Admit: 2021-02-08 | Discharge: 2021-02-08 | Disposition: A | Discharge status: Home / Self Care | Payer: Medicare Other | Source: Ambulatory Visit | Attending: Hematology | Admitting: Hematology

## 2021-02-08 DIAGNOSIS — D649 Anemia, unspecified: Secondary | ICD-10-CM | POA: Diagnosis not present

## 2021-02-08 DIAGNOSIS — C569 Malignant neoplasm of unspecified ovary: Secondary | ICD-10-CM | POA: Insufficient documentation

## 2021-02-08 DIAGNOSIS — I10 Essential (primary) hypertension: Secondary | ICD-10-CM | POA: Insufficient documentation

## 2021-02-08 DIAGNOSIS — C562 Malignant neoplasm of left ovary: Secondary | ICD-10-CM

## 2021-02-08 DIAGNOSIS — E119 Type 2 diabetes mellitus without complications: Secondary | ICD-10-CM | POA: Insufficient documentation

## 2021-02-08 DIAGNOSIS — Z7902 Long term (current) use of antithrombotics/antiplatelets: Secondary | ICD-10-CM | POA: Insufficient documentation

## 2021-02-08 DIAGNOSIS — I2510 Atherosclerotic heart disease of native coronary artery without angina pectoris: Secondary | ICD-10-CM | POA: Diagnosis not present

## 2021-02-08 HISTORY — PX: IR IMAGING GUIDED PORT INSERTION: IMG5740

## 2021-02-08 LAB — GLUCOSE, CAPILLARY: Glucose-Capillary: 138 mg/dL — ABNORMAL HIGH (ref 70–99)

## 2021-02-08 MED ORDER — MIDAZOLAM HCL 2 MG/2ML IJ SOLN
INTRAMUSCULAR | Status: AC
  Filled 2021-02-08: qty 2

## 2021-02-08 MED ORDER — MIDAZOLAM HCL 2 MG/2ML IJ SOLN
INTRAMUSCULAR | Status: AC | PRN
  Administered 2021-02-08 (×2): 1 mg via INTRAVENOUS

## 2021-02-08 MED ORDER — LIDOCAINE-EPINEPHRINE 2 %-1:100000 IJ SOLN
INTRAMUSCULAR | Status: AC | PRN
  Administered 2021-02-08: 410 mL

## 2021-02-08 MED ORDER — SODIUM CHLORIDE 0.9 % IV SOLN: INTRAVENOUS | Status: DC

## 2021-02-08 MED ORDER — FENTANYL CITRATE (PF) 100 MCG/2ML IJ SOLN
INTRAMUSCULAR | Status: AC
  Filled 2021-02-08: qty 2

## 2021-02-08 MED ORDER — HEPARIN SOD (PORK) LOCK FLUSH 100 UNIT/ML IV SOLN
INTRAVENOUS | Status: AC
  Filled 2021-02-08: qty 5

## 2021-02-08 MED ORDER — LIDOCAINE-EPINEPHRINE 1 %-1:100000 IJ SOLN
INTRAMUSCULAR | Status: AC
  Filled 2021-02-08: qty 1

## 2021-02-08 MED ORDER — FENTANYL CITRATE (PF) 100 MCG/2ML IJ SOLN
INTRAMUSCULAR | Status: AC | PRN
  Administered 2021-02-08 (×2): 50 ug via INTRAVENOUS

## 2021-02-08 NOTE — H&P (Signed)
Chief Complaint: Patient was seen in consultation today for port placement  Referring Physician(s): [name],[name]  Supervising Physician: Mir, Biochemist, clinical  Patient Status: [name] - Out-pt  History of Present Illness: Rebecca [name] is a 75 y.o. female with a past medical history significant for anemia, CAD s/p bilateral carotid endarterectomies (06/27/19 and 09/12/19) currently on Plavix, HTN, DM and recently noted ovarian cancer s/p ex lap, BSO, infra-gastric omentectomy and primary tumor debulking 12/19/20 who presents today for port placement to begin chemotherapy.  Rebecca [name] denies complaints today, she is anxious about pain during the procedure and when the port can be removed. She tells me she has had several health scares recently but has a lot of faith in God that he will get her through them all. She understands the procedure today and is agreeable to proceed as planned.  Past Medical History:  Diagnosis Date   Anemia    CAD (coronary artery disease)    DM2 (diabetes mellitus, type 2) (HCC)    GERD (gastroesophageal reflux disease)    HTN (hypertension)    Ovarian ca ([facility])    Port-A-Cath in place 02/02/2021    No past surgical history on file.  Allergies: Aspirin, Iodinated diagnostic agents, Mometasone, Naproxen sodium, Penicillin g, Triamcinolone, Codeine, and Ibuprofen  Medications: Prior to Admission medications   Medication Sig Start Date End Date Taking? Authorizing Provider  acetaminophen (TYLENOL) 325 MG tablet Take 975 mg by mouth every 6 (six) hours as needed for moderate pain.   Yes [provider]  atorvastatin (LIPITOR) 40 MG tablet Take 40 mg by mouth at bedtime. 10/30/20  Yes [provider]  azelastine (ASTELIN) 0.1 % nasal spray Place 1 spray into both nostrils 2 (two) times daily as needed for allergies. 09/17/20  Yes [provider]  baclofen (LIORESAL) 10 MG tablet Take 10 mg by mouth 3 (three) times daily as needed for  muscle spasms. 01/28/21  Yes [provider]  clopidogrel (PLAVIX) 75 MG tablet Take 75 mg by mouth daily. 11/05/20  Yes [provider]  diphenhydrAMINE (BENADRYL) 25 MG tablet Take 50 mg by mouth daily as needed for allergies or itching.   Yes [provider]  diphenhydrAMINE-zinc acetate (BENADRYL) cream Apply 1 application topically 3 (three) times daily as needed for itching.   Yes [provider]  ferrous sulfate 325 (65 FE) MG tablet Take 325 mg by mouth every evening. 11/26/20  Yes [provider]  furosemide (LASIX) 20 MG tablet Take 20 mg by mouth daily as needed for edema. 06/07/19  Yes [provider]  insulin degludec (TRESIBA FLEXTOUCH) 100 UNIT/ML FlexTouch Pen Inject 19 Units into the skin at bedtime. 12/15/20  Yes [provider]  JANUVIA 100 MG tablet Take 100 mg by mouth daily. 01/21/21  Yes [provider]  losartan (COZAAR) 50 MG tablet Take 50 mg by mouth at bedtime. 11/06/20  Yes [provider]  metFORMIN (GLUCOPHAGE) 1000 MG tablet Take 1,000 mg by mouth daily. 11/06/20  Yes [provider]  pantoprazole (PROTONIX) 20 MG tablet Take 20 mg by mouth daily. 11/10/20  Yes [provider]  Tetrahydrozoline HCl (REDNESS RELIEVER EYE DROPS OP) Place 1 drop into both eyes daily as needed (redness).   Yes [provider]  traMADol (ULTRAM) 50 MG tablet Take 50 mg by mouth every 6 (six) hours as needed for moderate pain. 01/28/21  Yes [provider]  CARBOPLATIN IV Inject into the vein every 21 ( twenty-one) days.  02/11/21   [provider]  lidocaine-prilocaine (EMLA) cream Apply a small amount to port a cath site and cover with plastic wrap 1 hour prior to chemotherapy appointments 02/02/21   Derek Jack, MD  PACLITAXEL IV Inject into the vein every 21 ( twenty-one) days. 02/11/21   [provider]  predniSONE (DELTASONE) 50 MG tablet Take 13hrs, 7hrs  and 1hr before port placement 01/30/21   Derek Jack, MD  prochlorperazine (COMPAZINE) 10 MG tablet Take 1 tablet (10 mg total) by mouth every 6 (six) hours as needed (Nausea or vomiting). 02/02/21   Derek Jack, MD     No family history on file.  Social History   Socioeconomic History   Marital status: Single    Spouse name: Not on file   Number of children: Not on file   Years of education: Not on file   Highest education level: Not on file  Occupational History   Not on file  Tobacco Use   Smoking status: Not on file   Smokeless tobacco: Not on file  Substance and Sexual Activity   Alcohol use: Not on file   Drug use: Not on file   Sexual activity: Not on file  Other Topics Concern   Not on file  Social History Narrative   Not on file   Social Determinants of Health   Financial Resource Strain: Low Risk    Difficulty of Paying Living Expenses: Not hard at all  Food Insecurity: No Food Insecurity   Worried About Running Out of Food in the Last Year: Never true   [place] in the Last Year: Never true  Transportation Needs: Unmet Transportation Needs   Lack of Transportation (Medical): Yes   Lack of Transportation (Non-Medical): No  Physical Activity: Not on file  Stress: Stress Concern Present   Feeling of Stress : To some extent  Social Connections: Moderately Isolated   Frequency of Communication with Friends and Family: More than three times a week   Frequency of Social Gatherings with Friends and Family: More than three times a week   Attends Religious Services: More than 4 times per year   Active Member of [employer] or Organizations: No   Attends Archivist Meetings: Never   Marital Status: Widowed     Review of Systems: A 12 point ROS discussed and pertinent positives are indicated in the HPI above.  All other systems are negative.  Review of Systems  Constitutional:  Negative for chills and fever.  Respiratory:  Negative for  cough and shortness of breath.   Cardiovascular:  Negative for chest pain.  Gastrointestinal:  Negative for abdominal pain, diarrhea, nausea and vomiting.  Musculoskeletal:  Negative for back pain.  Neurological:  Negative for headaches.   Vital Signs: BP (!) 155/62   Pulse 76   Temp 97.9 F (36.6 C) (Oral)   Resp 15   Ht 5\' 2"  (1.575 m)   Wt 164 lb (74.4 kg)   SpO2 100%   BMI 30.00 kg/m   Physical Exam Vitals reviewed.  Constitutional:      General: She is not in acute distress. HENT:     Head: Normocephalic.     Mouth/Throat:     Mouth: Mucous membranes are moist.     Pharynx: Oropharynx is clear. No oropharyngeal exudate or posterior oropharyngeal erythema.  Cardiovascular:     Rate and Rhythm: Normal rate and regular rhythm.  Pulmonary:     Effort: Pulmonary effort is  normal.     Breath sounds: Normal breath sounds.  Abdominal:     Palpations: Abdomen is soft.  Skin:    General: Skin is warm and dry.  Neurological:     Mental Status: She is alert and oriented to person, place, and time.  Psychiatric:        Mood and Affect: Mood normal.        Behavior: Behavior normal.        Thought Content: Thought content normal.        Judgment: Judgment normal.     MD Evaluation Airway: WNL Heart: WNL Abdomen: WNL Chest/ Lungs: WNL ASA  Classification: 3 Mallampati/Airway Score: Two   Imaging: No results found.  Labs:  CBC: No results for input(s): WBC, HGB, HCT, PLT in the last 8760 hours.  COAGS: No results for input(s): INR, APTT in the last 8760 hours.  BMP: No results for input(s): NA, K, CL, CO2, GLUCOSE, BUN, CALCIUM, CREATININE, GFRNONAA, GFRAA in the last 8760 hours.  Invalid input(s): CMP  LIVER FUNCTION TESTS: No results for input(s): BILITOT, AST, ALT, ALKPHOS, PROT, ALBUMIN in the last 8760 hours.  TUMOR MARKERS: No results for input(s): AFPTM, CEA, CA199, CHROMGRNA in the last 8760 hours.  Assessment and Plan:  75 y/o M with  history of ovarian cancer s/p ex lap, BSO, infra-gastric omentectomy and primary tumor debulking 12/19/20 who presents today for port placement prior to beginning chemotherapy.   Risks and benefits of image-guided Port-a-catheter placement were discussed with the patient including, but not limited to bleeding, infection, pneumothorax, or fibrin sheath development and need for additional procedures.  All of the patient's questions were answered, patient is agreeable to proceed.  Consent signed and in chart.  Thank you for this interesting consult.  I greatly enjoyed meeting Rebecca [name] and look forward to participating in their care.  A copy of this report was sent to the requesting provider on this date.  Electronically Signed: Joaquim Nam, PA-C 02/08/2021, 10:17 AM   I spent a total of 30 Minutes  in face to face in clinical consultation, greater than 50% of which was counseling/coordinating care for port placement.

## 2021-02-08 NOTE — Procedures (Signed)
Interventional Radiology Procedure Note  Procedure: Port placement.  Indication: Ovarian Ca  Findings: Please refer to procedural dictation for full description.  Complications: None  EBL: < 10 mL  Donathan Buller, MD [phone]   

## 2021-02-11 ENCOUNTER — Other Ambulatory Visit: Payer: Self-pay

## 2021-02-11 ENCOUNTER — Encounter (HOSPITAL_COMMUNITY): Payer: Self-pay

## 2021-02-11 ENCOUNTER — Inpatient Hospital Stay (HOSPITAL_COMMUNITY): Payer: Medicare Other

## 2021-02-11 VITALS — BP 161/74 | HR 100 | Temp 98.7°F | Resp 20

## 2021-02-11 DIAGNOSIS — Z7902 Long term (current) use of antithrombotics/antiplatelets: Secondary | ICD-10-CM | POA: Diagnosis not present

## 2021-02-11 DIAGNOSIS — Z79899 Other long term (current) drug therapy: Secondary | ICD-10-CM | POA: Diagnosis not present

## 2021-02-11 DIAGNOSIS — D649 Anemia, unspecified: Secondary | ICD-10-CM | POA: Diagnosis not present

## 2021-02-11 DIAGNOSIS — C562 Malignant neoplasm of left ovary: Secondary | ICD-10-CM

## 2021-02-11 DIAGNOSIS — Z7984 Long term (current) use of oral hypoglycemic drugs: Secondary | ICD-10-CM | POA: Diagnosis not present

## 2021-02-11 DIAGNOSIS — I779 Disorder of arteries and arterioles, unspecified: Secondary | ICD-10-CM | POA: Diagnosis not present

## 2021-02-11 DIAGNOSIS — E1142 Type 2 diabetes mellitus with diabetic polyneuropathy: Secondary | ICD-10-CM | POA: Diagnosis not present

## 2021-02-11 DIAGNOSIS — Z5111 Encounter for antineoplastic chemotherapy: Secondary | ICD-10-CM | POA: Diagnosis present

## 2021-02-11 DIAGNOSIS — Z95828 Presence of other vascular implants and grafts: Secondary | ICD-10-CM

## 2021-02-11 LAB — COMPREHENSIVE METABOLIC PANEL
ALT: 13 U/L (ref 0–44)
AST: 11 U/L — ABNORMAL LOW (ref 15–41)
Albumin: 3.7 g/dL (ref 3.5–5.0)
Alkaline Phosphatase: 80 U/L (ref 38–126)
Anion gap: 3 — ABNORMAL LOW (ref 5–15)
BUN: 14 mg/dL (ref 8–23)
CO2: 26 mmol/L (ref 22–32)
Calcium: 8.9 mg/dL (ref 8.9–10.3)
Chloride: 108 mmol/L (ref 98–111)
Creatinine, Ser: 0.82 mg/dL (ref 0.44–1.00)
GFR, Estimated: >60 mL/min (ref >60)
Glucose, Bld: 168 mg/dL — ABNORMAL HIGH (ref 70–99)
Potassium: 3.7 mmol/L (ref 3.5–5.1)
Sodium: 137 mmol/L (ref 135–145)
Total Bilirubin: 0.4 mg/dL (ref 0.3–1.2)
Total Protein: 6.7 g/dL (ref 6.5–8.1)

## 2021-02-11 LAB — CBC WITH DIFFERENTIAL/PLATELET
Abs Immature Granulocytes: 0.04 10*3/uL (ref 0.00–0.07)
Basophils Absolute: 0 10*3/uL (ref 0.0–0.1)
Basophils Relative: 0 %
Eosinophils Absolute: 0.1 10*3/uL (ref 0.0–0.5)
Eosinophils Relative: 2 %
HCT: 32 % — ABNORMAL LOW (ref 36.0–46.0)
Hemoglobin: 10.6 g/dL — ABNORMAL LOW (ref 12.0–15.0)
Immature Granulocytes: 1 %
Lymphocytes Relative: 21 %
Lymphs Abs: 1.7 10*3/uL (ref 0.7–4.0)
MCH: 29.8 pg (ref 26.0–34.0)
MCHC: 33.1 g/dL (ref 30.0–36.0)
MCV: 89.9 fL (ref 80.0–100.0)
Monocytes Absolute: 0.7 10*3/uL (ref 0.1–1.0)
Monocytes Relative: 8 %
Neutro Abs: 5.8 10*3/uL (ref 1.7–7.7)
Neutrophils Relative %: 68 %
Platelets: 258 10*3/uL (ref 150–400)
RBC: 3.56 MIL/uL — ABNORMAL LOW (ref 3.87–5.11)
RDW: 15.3 % (ref 11.5–15.5)
WBC: 8.4 10*3/uL (ref 4.0–10.5)
nRBC: 0 % (ref 0.0–0.2)

## 2021-02-11 LAB — LACTATE DEHYDROGENASE: LDH: 119 U/L (ref 98–192)

## 2021-02-11 MED ORDER — DIPHENHYDRAMINE HCL 50 MG/ML IJ SOLN
50.0000 mg | Freq: Once | INTRAMUSCULAR | Status: AC
  Administered 2021-02-11: 50 mg via INTRAVENOUS
  Filled 2021-02-11: qty 1

## 2021-02-11 MED ORDER — PALONOSETRON HCL INJECTION 0.25 MG/5ML
0.2500 mg | Freq: Once | INTRAVENOUS | Status: AC
  Administered 2021-02-11: 0.25 mg via INTRAVENOUS
  Filled 2021-02-11: qty 5

## 2021-02-11 MED ORDER — HEPARIN SOD (PORK) LOCK FLUSH 100 UNIT/ML IV SOLN
500.0000 [IU] | Freq: Once | INTRAVENOUS | Status: AC | PRN
  Administered 2021-02-11: 500 [IU]

## 2021-02-11 MED ORDER — SODIUM CHLORIDE 0.9% FLUSH
10.0000 mL | INTRAVENOUS | Status: DC | PRN
  Administered 2021-02-11: 10 mL

## 2021-02-11 MED ORDER — SODIUM CHLORIDE 0.9 % IV SOLN: Freq: Once | INTRAVENOUS | Status: AC

## 2021-02-11 MED ORDER — SODIUM CHLORIDE 0.9 % IV SOLN
500.0000 mg | Freq: Once | INTRAVENOUS | Status: AC
  Administered 2021-02-11: 500 mg via INTRAVENOUS
  Filled 2021-02-11: qty 50

## 2021-02-11 MED ORDER — SODIUM CHLORIDE 0.9 % IV SOLN
175.0000 mg/m2 | Freq: Once | INTRAVENOUS | Status: AC
  Administered 2021-02-11: 330 mg via INTRAVENOUS
  Filled 2021-02-11: qty 55

## 2021-02-11 MED ORDER — SODIUM CHLORIDE 0.9 % IV SOLN
10.0000 mg | Freq: Once | INTRAVENOUS | Status: AC
  Administered 2021-02-11: 10 mg via INTRAVENOUS
  Filled 2021-02-11: qty 10

## 2021-02-11 MED ORDER — FAMOTIDINE 20 MG IN NS 100 ML IVPB
20.0000 mg | Freq: Once | INTRAVENOUS | Status: DC
  Filled 2021-02-11: qty 100

## 2021-02-11 MED ORDER — SODIUM CHLORIDE 0.9 % IV SOLN
150.0000 mg | Freq: Once | INTRAVENOUS | Status: AC
  Administered 2021-02-11: 150 mg via INTRAVENOUS
  Filled 2021-02-11: qty 150

## 2021-02-11 MED ORDER — FAMOTIDINE IN NACL 20-0.9 MG/50ML-% IV SOLN
20.0000 mg | Freq: Two times a day (BID) | INTRAVENOUS | Status: DC
  Administered 2021-02-11: 20 mg via INTRAVENOUS
  Filled 2021-02-11 (×3): qty 50

## 2021-02-11 NOTE — Progress Notes (Signed)
Patient presents today for D1C1 Taxol/Carbo. Consent signed and plan signed. Patient tolerated chemotherapy with no complaints voiced. Side effects with management reviewed understanding verbalized. Port site clean and dry with no bruising or swelling noted at site. Good blood return noted before and after administration of chemotherapy. Band aid applied. Patient left in satisfactory condition with VSS and no s/s of distress noted.

## 2021-02-11 NOTE — Patient Instructions (Signed)
[facility]  Discharge Instructions: Thank you for choosing [facility] to provide your oncology and hematology care.  If you have a lab appointment with the [facility], please come in thru the Main Entrance and check in at the main information desk.  Wear comfortable clothing and clothing appropriate for easy access to any Portacath or PICC line.   We strive to give you quality time with your provider. You may need to reschedule your appointment if you arrive late (15 or more minutes).  Arriving late affects you and other patients whose appointments are after yours.  Also, if you miss three or more appointments without notifying the office, you may be dismissed from the clinic at the provider's discretion.      For prescription refill requests, have your pharmacy contact our office and allow 72 hours for refills to be completed.    Today you received the following chemotherapy and/or immunotherapy agents Carbo/Taxol, return as scheduled.   To help prevent nausea and vomiting after your treatment, we encourage you to take your nausea medication as directed.  BELOW ARE SYMPTOMS THAT SHOULD BE REPORTED IMMEDIATELY: *FEVER GREATER THAN 100.4 F (38 C) OR HIGHER *CHILLS OR SWEATING *NAUSEA AND VOMITING THAT IS NOT CONTROLLED WITH YOUR NAUSEA MEDICATION *UNUSUAL SHORTNESS OF BREATH *UNUSUAL BRUISING OR BLEEDING *URINARY PROBLEMS (pain or burning when urinating, or frequent urination) *BOWEL PROBLEMS (unusual diarrhea, constipation, pain near the anus) TENDERNESS IN MOUTH AND THROAT WITH OR WITHOUT PRESENCE OF ULCERS (sore throat, sores in mouth, or a toothache) UNUSUAL RASH, SWELLING OR PAIN  UNUSUAL VAGINAL DISCHARGE OR ITCHING   Items with * indicate a potential emergency and should be followed up as soon as possible or go to the Emergency Department if any problems should occur.  Please show the CHEMOTHERAPY ALERT CARD or IMMUNOTHERAPY ALERT CARD at check-in to  the Emergency Department and triage nurse.  Should you have questions after your visit or need to cancel or reschedule your appointment, please contact [facility] [phone]  and follow the prompts.  Office hours are 8:00 a.m. to 4:30 p.m. Monday - Friday. Please note that voicemails left after 4:00 p.m. may not be returned until the following business day.  We are closed weekends and major holidays. You have access to a nurse at all times for urgent questions. Please call the main number to the clinic [phone] and follow the prompts.  For any non-urgent questions, you may also contact your provider using MyChart. We now offer e-Visits for anyone 45 and older to request care online for non-urgent symptoms. For details visit [email or web address].   Also download the MyChart app! Go to the app store, search "MyChart", open the app, select [facility], and log in with your MyChart username and password.  Due to Covid, a mask is required upon entering the hospital/clinic. If you do not have a mask, one will be given to you upon arrival. For doctor visits, patients may have 1 support person aged 83 or older with them. For treatment visits, patients cannot have anyone with them due to current Covid guidelines and our immunocompromised population.

## 2021-02-11 NOTE — Progress Notes (Signed)
Verified steriod allergy was with topical triamcinolone. Pt states to RN she had oral decadron with scan.  Informed RN to watch patient with infusion.

## 2021-02-11 NOTE — Progress Notes (Signed)
Patients port flushed without difficulty.  Good blood return noted with no bruising or swelling noted at site.  Patient remains accessed for chemotherapy treatment. Patient is in stable condition.  °

## 2021-02-12 ENCOUNTER — Inpatient Hospital Stay (HOSPITAL_COMMUNITY): Payer: Medicare Other | Admitting: General Practice

## 2021-02-12 DIAGNOSIS — C562 Malignant neoplasm of left ovary: Secondary | ICD-10-CM

## 2021-02-12 LAB — CA 125: Cancer Antigen (CA) 125: 18 U/mL (ref 0.0–38.1)

## 2021-02-12 NOTE — Progress Notes (Signed)
Rebecca [name]  Initial Assessment   Rebecca [name] is a 75 y.o. year old female contacted by phone. Clinical Social [name] was referred by medical oncology for assessment of psychosocial needs.   SDOH (Social Determinants of Health) assessments performed: Yes   Distress Screen completed: No No flowsheet data found.    Family/Social Information:  Housing Arrangement: patient lives alone; daughter stays with her when she is feeling sick, checks on her often Family members/support persons in your life? Family, Friends/Colleagues, and Church; gets good support from family and friends, "if I need them, they will stay with me."  "I don't worry about no one staying with me." Transportation concerns: no family will transport as needed.  Employment: Retired. Income source: Conservation officer, historic buildings, used to [name] for [employer] concerns: No Type of concern: Social worker access concerns: no, is able to get food, but often feels nauseous post chemo treatments.  "I am trying to eat a little bit." Family and friends bring food as needed.   Religious or spiritual practice: yes, "God has brought me through this surgery, He will take care of me." Medication Concerns: no, has Medicaid  Services Currently in place:  none  Coping/ Adjustment to diagnosis: Patient understands treatment plan and what happens next? yes, diagnosed with Stage IV ovarian cancer.  Had surgery and "removed a 10 pound tumor."  Now in chemotherapy which is making her nauseous.  She uses her faith to [place] her resources to "get through this."   Concerns about diagnosis and/or treatment: Pain or discomfort during procedures and side effects of chemotherapy Patient reported stressors: Adjusting to my illness and Physical issues Hopes and priorities: use her faith to get through treatment Patient enjoys time with family/ friends and puzzles and working around the house, "I love to [name] puzzles", reads a  lot Current coping skills/ strengths: Capable of independent living , Religious Affiliation , and Supportive family/friends     SUMMARY:   Interventions: Discussed common feeling and emotions when being diagnosed with cancer, and the importance of support during treatment Informed patient of the support team roles and support services at [facility] Provided [place] contact information and encouraged patient to call with any questions or concerns Provided patient with information about [place] and [place]   Follow Up Plan: Patient will contact CSW with any support or resource needs Patient verbalizes understanding of plan: Yes    Climax [name], [place], [place] Social Worker Phone:  [phone]

## 2021-02-13 ENCOUNTER — Telehealth (HOSPITAL_COMMUNITY): Payer: Self-pay | Admitting: Hematology

## 2021-02-13 ENCOUNTER — Other Ambulatory Visit (HOSPITAL_COMMUNITY): Payer: Self-pay | Admitting: *Deleted

## 2021-02-13 ENCOUNTER — Other Ambulatory Visit (HOSPITAL_COMMUNITY): Payer: Self-pay

## 2021-02-13 ENCOUNTER — Telehealth (HOSPITAL_COMMUNITY): Payer: Self-pay

## 2021-02-13 MED ORDER — DOCUSATE SODIUM 100 MG PO CAPS: 100.0000 mg | ORAL_CAPSULE | Freq: Every day | ORAL | 1 refills | Status: DC | PRN

## 2021-02-13 NOTE — Telephone Encounter (Signed)
Spoke with pt regarding the [employer]. She does qualify. Advised her of the processes and guidelines. She will bring in fin docs on 12/22.

## 2021-02-13 NOTE — Telephone Encounter (Signed)
Patient called for 24-hour call back. Patient states she is doing okay, complains of occasional headache, fatigue, and some nausea without vomiting. Patient informed to call cancer center if symptoms worsen.

## 2021-02-13 NOTE — Telephone Encounter (Signed)
Pt called into clinic requesting a refill on Colace 100 mg as needed for mild constipation. Medication sent to [facility] in [place] per pt request. Called pt back to let her know medication was sent in pt verbalized understanding.

## 2021-02-14 ENCOUNTER — Other Ambulatory Visit (HOSPITAL_COMMUNITY): Payer: Self-pay | Admitting: *Deleted

## 2021-02-14 MED ORDER — TRAMADOL HCL 50 MG PO TABS: 100.0000 mg | ORAL_TABLET | Freq: Four times a day (QID) | ORAL | 0 refills | Status: DC | PRN

## 2021-02-14 MED ORDER — TRAMADOL HCL 50 MG PO TABS
100.0000 mg | ORAL_TABLET | Freq: Four times a day (QID) | ORAL | 0 refills | Status: DC | PRN
Start: 2021-02-14 — End: 2022-11-12

## 2021-02-14 NOTE — Telephone Encounter (Signed)
Daughter called and stated that Ms. Haskins has developed severe pain all over her body since starting chemo on Monday.  Spoke with patient, who confirms that she is having no other symptoms aside from the pain.  Advised that this is likely from the Taxol she has been given.  Spoke to Dr Delton Coombes and script called in for Tramadol 100 mg every 6 hours.  Explained to her she would need to take 2 tablets rather than the one at a time.  Advised her that I will call her tomorrow morning to assess the effectiveness of the medication and if she was not able to get relief we would send her in something stronger.  She and her daughter verbalized understanding.

## 2021-02-15 ENCOUNTER — Other Ambulatory Visit (HOSPITAL_COMMUNITY): Payer: Self-pay | Admitting: *Deleted

## 2021-02-15 MED ORDER — HYDROCODONE-ACETAMINOPHEN 5-325 MG PO TABS: 1.0000 | ORAL_TABLET | Freq: Four times a day (QID) | ORAL | 0 refills | Status: DC | PRN

## 2021-02-15 NOTE — Telephone Encounter (Signed)
Patient called and is still in intractable pain all over her body.  Tried 100 mg Tramadol with no relief.  Script sent for hydrocodone 5/325 Q 6 prn.  Patient aware.

## 2021-02-20 NOTE — Progress Notes (Signed)
Rebecca [name], [place] [address]   CLINIC:  Medical Oncology/Hematology  PCP:  Everitt Amber, MD [id number] / [place] [id number] [phone]   REASON FOR VISIT:  Follow-up for ovarian cancer  PRIOR THERAPY: Exploratory laparotomy, BSO, and infra-gastric omentectomy on 12/19/2020 with Dr. Denman George  NGS Results: not done  CURRENT THERAPY: under work-up  BRIEF ONCOLOGIC HISTORY:  Oncology History  Ovarian cancer on left [facility])  01/07/2021 Initial Diagnosis   Ovarian cancer on left [facility])   01/30/2021 Cancer Staging   Staging form: Ovary, Fallopian Tube, and Primary Peritoneal Carcinoma, AJCC 8th Edition - Clinical stage from 01/30/2021: FIGO Stage IIIB (cT3b, cNX, cM0) - Signed by Derek Jack, MD on 01/30/2021 Histologic grade (G): G3 Histologic grading system: 4 grade system    02/11/2021 -  Chemotherapy   Patient is on Treatment Plan : OVARIAN Carboplatin (AUC 6) / Paclitaxel (175) q21d x 6 cycles       CANCER STAGING:  Cancer Staging  Ovarian cancer on left [facility]) Staging form: Ovary, Fallopian Tube, and Primary Peritoneal Carcinoma, AJCC 8th Edition - Clinical stage from 01/30/2021: FIGO Stage IIIB (cT3b, cNX, cM0) - Signed by Derek Jack, MD on 01/30/2021   INTERVAL HISTORY:  [name], a 75 y.o. female, returns for routine follow-up of her ovarian cancer. Rebecca [name] was last seen on 01/30/2021.   Today she reports feeling fair, and she is accompanied by her daughter in law. She reports full body bone pains. She is taking 2 hydrocodone tablets every 6 hours along with 2 tylenol tablets between doses of hydrocodone. The pain started on 12/15, and her daughter-in-law reports his pain resolved by 12/19-12/20. She reports occasional constipation, nausea, and 1 episode of vomiting. She reports occasional tingling and pin-and-needles sensation in her toes throughout the day; she reports previously she had numbness  in her toes. She denies hair loss. Her appetite is good.   REVIEW OF SYSTEMS:  Review of Systems  Gastrointestinal:  Positive for constipation, nausea and vomiting (x1).  Musculoskeletal:  Positive for arthralgias (all over).  Neurological:  Positive for numbness (toes).  All other systems reviewed and are negative.  PAST MEDICAL/SURGICAL HISTORY:  Past Medical History:  Diagnosis Date   Anemia    CAD (coronary artery disease)    DM2 (diabetes mellitus, type 2) (HCC)    GERD (gastroesophageal reflux disease)    HTN (hypertension)    Ovarian ca ([place])    Port-A-Cath in place 02/02/2021   Past Surgical History:  Procedure Laterality Date   IR IMAGING GUIDED PORT INSERTION  02/08/2021    SOCIAL HISTORY:  Social History   Socioeconomic History   Marital status: Single    Spouse name: Not on file   Number of children: Not on file   Years of education: Not on file   Highest education level: Not on file  Occupational History   Not on file  Tobacco Use   Smoking status: Not on file   Smokeless tobacco: Not on file  Substance and Sexual Activity   Alcohol use: Not on file   Drug use: Not on file   Sexual activity: Not on file  Other Topics Concern   Not on file  Social History Narrative   Not on file   Social Determinants of Health   Financial Resource Strain: Low Risk    Difficulty of Paying Living Expenses: Not hard at all  Food Insecurity: No Food Insecurity  Worried About Charity fundraiser in the Last Year: Never true   [place] in the Last Year: Never true  Transportation Needs: Unmet Transportation Needs   Lack of Transportation (Medical): Yes   Lack of Transportation (Non-Medical): No  Physical Activity: Not on file  Stress: Stress Concern Present   Feeling of Stress : To some extent  Social Connections: Moderately Isolated   Frequency of Communication with Friends and Family: More than three times a week   Frequency of Social Gatherings with  Friends and Family: More than three times a week   Attends Religious Services: More than 4 times per year   Active Member of [employer] or Organizations: No   Attends Archivist Meetings: Never   Marital Status: Widowed  Intimate Partner Violence: Not on file    FAMILY HISTORY:  No family history on file.  CURRENT MEDICATIONS:  Current Outpatient Medications  Medication Sig Dispense Refill   atorvastatin (LIPITOR) 40 MG tablet Take 40 mg by mouth at bedtime.     CARBOPLATIN IV Inject into the vein every 21 ( twenty-one) days.     clopidogrel (PLAVIX) 75 MG tablet Take 75 mg by mouth daily.     diphenhydrAMINE (BENADRYL) 25 MG tablet Take 50 mg by mouth daily as needed for allergies or itching.     ferrous sulfate 325 (65 FE) MG tablet Take 325 mg by mouth every evening.     furosemide (LASIX) 20 MG tablet Take 20 mg by mouth daily as needed for edema.     gabapentin (NEURONTIN) 300 MG capsule Take 1 capsule (300 mg total) by mouth 2 (two) times daily. 60 capsule 2   insulin degludec (TRESIBA FLEXTOUCH) 100 UNIT/ML FlexTouch Pen Inject 19 Units into the skin at bedtime.     JANUVIA 100 MG tablet Take 100 mg by mouth daily.     lidocaine-prilocaine (EMLA) cream Apply a small amount to port a cath site and cover with plastic wrap 1 hour prior to chemotherapy appointments 30 g 3   losartan (COZAAR) 50 MG tablet Take 50 mg by mouth at bedtime.     metFORMIN (GLUCOPHAGE) 1000 MG tablet Take 1,000 mg by mouth daily.     PACLITAXEL IV Inject into the vein every 21 ( twenty-one) days.     pantoprazole (PROTONIX) 20 MG tablet Take 20 mg by mouth daily.     predniSONE (DELTASONE) 50 MG tablet Take 13hrs, 7hrs and 1hr before port placement 3 tablet 0   acetaminophen (TYLENOL) 325 MG tablet Take 975 mg by mouth every 6 (six) hours as needed for moderate pain. (Patient not taking: Reported on 02/21/2021)     azelastine (ASTELIN) 0.1 % nasal spray Place 1 spray into both nostrils 2 (two) times  daily as needed for allergies. (Patient not taking: Reported on 02/21/2021)     baclofen (LIORESAL) 10 MG tablet Take 10 mg by mouth 3 (three) times daily as needed for muscle spasms. (Patient not taking: Reported on 02/21/2021)     diphenhydrAMINE-zinc acetate (BENADRYL) cream Apply 1 application topically 3 (three) times daily as needed for itching. (Patient not taking: Reported on 02/21/2021)     docusate sodium (COLACE) 100 MG capsule Take 1 capsule (100 mg total) by mouth daily as needed for mild constipation. (Patient not taking: Reported on 02/21/2021) 30 capsule 1   HYDROcodone-acetaminophen (NORCO/VICODIN) 5-325 MG tablet Take 1 tablet by mouth every 6 (six) hours as needed for moderate pain. (Patient not  taking: Reported on 02/21/2021) 60 tablet 0   prochlorperazine (COMPAZINE) 10 MG tablet Take 1 tablet (10 mg total) by mouth every 6 (six) hours as needed (Nausea or vomiting). (Patient not taking: Reported on 02/21/2021) 30 tablet 1   Tetrahydrozoline HCl (REDNESS RELIEVER EYE DROPS OP) Place 1 drop into both eyes daily as needed (redness). (Patient not taking: Reported on 02/21/2021)     traMADol (ULTRAM) 50 MG tablet Take 2 tablets (100 mg total) by mouth every 6 (six) hours as needed. (Patient not taking: Reported on 02/21/2021) 240 tablet 0   No current facility-administered medications for this visit.    ALLERGIES:  Allergies  Allergen Reactions   Aspirin Hives   Iodinated Diagnostic Agents Itching    Rash    Mometasone Itching   Naproxen Sodium Itching   Penicillin G Swelling   Triamcinolone Itching   Codeine Palpitations   Ibuprofen Rash    PHYSICAL EXAM:  Performance status (ECOG): 1 - Symptomatic but completely ambulatory  Vitals:   02/21/21 1436  BP: 135/68  Pulse: 99  Resp: 18  Temp: 98.1 F (36.7 C)  SpO2: 98%   Wt Readings from Last 3 Encounters:  02/21/21 178 lb 2.1 oz (80.8 kg)  02/11/21 178 lb 12.7 oz (81.1 kg)  02/08/21 164 lb (74.4 kg)    Physical Exam Vitals reviewed.  Constitutional:      Appearance: Normal appearance.  Cardiovascular:     Rate and Rhythm: Normal rate and regular rhythm.     Pulses: Normal pulses.     Heart sounds: Normal heart sounds.  Pulmonary:     Effort: Pulmonary effort is normal.     Breath sounds: Normal breath sounds.  Abdominal:     Palpations: Abdomen is soft. There is no hepatomegaly, splenomegaly or mass.     Tenderness: There is no abdominal tenderness.  Neurological:     General: No focal deficit present.     Mental Status: She is alert and oriented to person, place, and time.  Psychiatric:        Mood and Affect: Mood normal.        Behavior: Behavior normal.     LABORATORY DATA:  I have reviewed the labs as listed.  CBC Latest Ref Rng & Units 02/21/2021 02/11/2021  WBC 4.0 - 10.5 K/uL 4.1 8.4  Hemoglobin 12.0 - 15.0 g/dL 10.6(L) 10.6(L)  Hematocrit 36.0 - 46.0 % 32.1(L) 32.0(L)  Platelets 150 - 400 K/uL 229 258   CMP Latest Ref Rng & Units 02/21/2021 02/11/2021  Glucose 70 - 99 mg/dL 155(H) 168(H)  BUN 8 - 23 mg/dL 14 14  Creatinine 0.44 - 1.00 mg/dL 0.83 0.82  Sodium 135 - 145 mmol/L 133(L) 137  Potassium 3.5 - 5.1 mmol/L 4.6 3.7  Chloride 98 - 111 mmol/L 101 108  CO2 22 - 32 mmol/L 24 26  Calcium 8.9 - 10.3 mg/dL 9.4 8.9  Total Protein 6.5 - 8.1 g/dL 7.8 6.7  Total Bilirubin 0.3 - 1.2 mg/dL 0.3 0.4  Alkaline Phos 38 - 126 U/L 93 80  AST 15 - 41 U/L 16 11(L)  ALT 0 - 44 U/L 21 13    DIAGNOSTIC IMAGING:  I have independently reviewed the scans and discussed with the patient. IR IMAGING GUIDED PORT INSERTION  Result Date: 02/08/2021 INDICATION: 75 year old woman with ovarian malignancy presents to IR for chest port placement EXAM: IMPLANTED PORT A CATH PLACEMENT WITH ULTRASOUND AND FLUOROSCOPIC GUIDANCE MEDICATIONS: None ANESTHESIA/SEDATION: Moderate (conscious) sedation was employed  during this procedure. A total of Versed 2 mg and Fentanyl 100 mcg was  administered intravenously. Moderate Sedation Time: 16 minutes. The patient's level of consciousness and vital signs were monitored continuously by radiology nursing throughout the procedure under my direct supervision. FLUOROSCOPY TIME:  0 minutes, 30 seconds (1.9 mGy) COMPLICATIONS: None immediate. PROCEDURE: The procedure, risks, benefits, and alternatives were explained to the patient. Questions regarding the procedure were encouraged and answered. The patient understands and consents to the procedure. A timeout was performed prior to the initiation of the procedure. Patient positioned supine on the angiography table. Right neck and anterior upper chest prepped and draped in the usual sterile fashion. All elements of maximal sterile barrier were utilized including, cap, mask, sterile gown, sterile gloves, large sterile drape, hand scrubbing and 2% Chlorhexidine for skin cleaning. The right internal jugular vein was evaluated with ultrasound and shown to be patent. A permanent ultrasound image was obtained and placed in the patient's medical record. Local anesthesia was provided with 1% lidocaine with epinephrine. Using sterile gel and a sterile probe cover, the right internal jugular vein was entered with a 21 ga needle during real time ultrasound guidance. 0.018 inch guidewire placed and 21 ga needle exchanged for transitional dilator set. Utilizing fluoroscopy, 0.035 inch guidewire advanced through the needle without difficulty. Attention then turned to the right anterior upper chest. Following local lidocaine administration, a port pocket was created. The catheter was connected to the port and brought from the pocket to the venotomy site through a subcutaneous tunnel. The catheter was cut to size and inserted through the peel-away sheath. The catheter tip was positioned at the cavoatrial junction using fluoroscopic guidance. The port aspirated and flushed well. The port pocket was closed with deep and  superficial absorbable suture. The port pocket incision and venotomy sites were also sealed with Dermabond. IMPRESSION: Successful placement of a right internal jugular approach power injectable Port-A-Cath. The catheter is ready for immediate use. Electronically Signed   By: Miachel Roux M.D.   On: 02/08/2021 12:38     ASSESSMENT:  Stage IIIb high-grade serous ovarian carcinoma: - Presentation with abdominal distention and bloating sensation. - CT abdomen at outside hospital on 12/05/2020 with findings concerning for ovarian neoplasm. - CT chest without contrast on 12/18/2020 with scattered solid pulmonary nodules measuring up to 3 mm, indeterminate. - CA125 on 12/05/2020-10000 - 12/18/2020 exploratory laparotomy, bilateral salpingo-oophorectomy, infra gastric omentectomy, R0 primary tumor debulking by Dr. Denman [facility] at [facility]. - Pathology-left ovary high-grade serous carcinoma, greatest dimension 26.5 cm, left ovarian capsule intact.  Fallopian tube surface involvement present, omentum involvement present, largest extrapelvic peritoneal focus-macroscopic, peritoneal/ascitic fluid involvement negative.  PT3BPNX.  FIGO stage IIIb. - She was evaluated for FLORA 5 clinical trial, found not to be a candidate. - Germline mutation testing was negative.     Social/family history: - She is seen with her daughter-in-law today.  She lives by herself.  She ambulates with the help of cane.  She retired after working at [employer].  She is non-smoker. - Brother had prostate cancer.  Another brother had throat cancer.  Sister had kidney cancer.  Grandson had bladder cancer at age 27.  Paternal uncle had head and neck cancer.   PLAN:  Stage IIIb high-grade serous ovarian carcinoma: - She received first cycle of carboplatin and paclitaxel on 02/11/2021. - She reported pain in her bones starting 02/13/2021, lasted until 02/18/2021. - We have tried tramadol which did not help.  She took  hydrocodone 5/325 2  tablets every 6 hours which has helped.  Pain went away after 2 days of hydrocodone. - She had some nausea which lasted 2 to 3 days.  She vomited once 2 days ago. - We reviewed her labs today which showed normal white count and platelet count.  Mild anemia with hemoglobin 10.6 is stable.  Creatinine and LFTs are normal. - We have discussed CT scan of the chest abdomen and pelvis dated 01/31/2021 done at [facility] imaging center which showed small fluid within the lower pelvis, likely postsurgical seroma.  Stable indeterminate hypodensity within the posterior right hepatic lobe.  Stable tiny 2 to 3 mm posterior lateral right lower lobe nodule. - She will come back in 1 week for her cycle 2. - We will consider dose reduction of paclitaxel by 20%.  2.  Carotid artery disease: - She underwent carotid endarterectomy in 2021.  She is on Plavix 75 mg daily.  3.  Type 2 diabetes: - Currently on metformin, Januvia and Tresiba.  4.  Peripheral neuropathy: - She reported pins and needle sensation in her toes since the start of her cycle of chemotherapy. - We will start her on gabapentin 300 mg twice daily.   Orders placed this encounter:  Orders Placed This Encounter  Procedures   Magnesium     Derek Jack, MD [place] [phone]   I, Thana Ates, am acting as a scribe for Dr. Derek Jack.  I, Derek Jack MD, have reviewed the above documentation for accuracy and completeness, and I agree with the above.

## 2021-02-21 ENCOUNTER — Other Ambulatory Visit: Payer: Self-pay

## 2021-02-21 ENCOUNTER — Other Ambulatory Visit (HOSPITAL_COMMUNITY): Payer: Self-pay | Admitting: *Deleted

## 2021-02-21 ENCOUNTER — Inpatient Hospital Stay (HOSPITAL_COMMUNITY): Payer: Medicare Other

## 2021-02-21 ENCOUNTER — Inpatient Hospital Stay (HOSPITAL_BASED_OUTPATIENT_CLINIC_OR_DEPARTMENT_OTHER): Payer: Medicare Other | Admitting: Hematology

## 2021-02-21 VITALS — BP 135/68 | HR 99 | Temp 98.1°F | Resp 18 | Ht 62.0 in | Wt 178.1 lb

## 2021-02-21 DIAGNOSIS — C562 Malignant neoplasm of left ovary: Secondary | ICD-10-CM

## 2021-02-21 DIAGNOSIS — Z5111 Encounter for antineoplastic chemotherapy: Secondary | ICD-10-CM | POA: Diagnosis not present

## 2021-02-21 LAB — COMPREHENSIVE METABOLIC PANEL
ALT: 21 U/L (ref 0–44)
AST: 16 U/L (ref 15–41)
Albumin: 4.2 g/dL (ref 3.5–5.0)
Alkaline Phosphatase: 93 U/L (ref 38–126)
Anion gap: 8 (ref 5–15)
BUN: 14 mg/dL (ref 8–23)
CO2: 24 mmol/L (ref 22–32)
Calcium: 9.4 mg/dL (ref 8.9–10.3)
Chloride: 101 mmol/L (ref 98–111)
Creatinine, Ser: 0.83 mg/dL (ref 0.44–1.00)
GFR, Estimated: >60 mL/min (ref >60)
Glucose, Bld: 155 mg/dL — ABNORMAL HIGH (ref 70–99)
Potassium: 4.6 mmol/L (ref 3.5–5.1)
Sodium: 133 mmol/L — ABNORMAL LOW (ref 135–145)
Total Bilirubin: 0.3 mg/dL (ref 0.3–1.2)
Total Protein: 7.8 g/dL (ref 6.5–8.1)

## 2021-02-21 LAB — CBC WITH DIFFERENTIAL/PLATELET
Abs Immature Granulocytes: 0.01 10*3/uL (ref 0.00–0.07)
Basophils Absolute: 0 10*3/uL (ref 0.0–0.1)
Basophils Relative: 0 %
Eosinophils Absolute: 0.1 10*3/uL (ref 0.0–0.5)
Eosinophils Relative: 2 %
HCT: 32.1 % — ABNORMAL LOW (ref 36.0–46.0)
Hemoglobin: 10.6 g/dL — ABNORMAL LOW (ref 12.0–15.0)
Immature Granulocytes: 0 %
Lymphocytes Relative: 47 %
Lymphs Abs: 2 10*3/uL (ref 0.7–4.0)
MCH: 29.4 pg (ref 26.0–34.0)
MCHC: 33 g/dL (ref 30.0–36.0)
MCV: 88.9 fL (ref 80.0–100.0)
Monocytes Absolute: 0.4 10*3/uL (ref 0.1–1.0)
Monocytes Relative: 10 %
Neutro Abs: 1.7 10*3/uL (ref 1.7–7.7)
Neutrophils Relative %: 41 %
Platelets: 229 10*3/uL (ref 150–400)
RBC: 3.61 MIL/uL — ABNORMAL LOW (ref 3.87–5.11)
RDW: 14.2 % (ref 11.5–15.5)
WBC: 4.1 10*3/uL (ref 4.0–10.5)
nRBC: 0 % (ref 0.0–0.2)

## 2021-02-21 MED ORDER — GABAPENTIN 300 MG PO CAPS: 300.0000 mg | ORAL_CAPSULE | Freq: Two times a day (BID) | ORAL | 2 refills | Status: DC

## 2021-02-21 NOTE — Patient Instructions (Addendum)
[address] at [facility] Discharge Instructions   You were seen and examined today by Dr. Delton Coombes.  He reviewed your lab work which is normal/stable.  We sent a prescription to your pharmacy called gabapentin - this is to help with the pins and needles feeling in your toes.   For the next treatment, we will cut back on the dose of the chemotherapy a little bit to help alleviate the body aches and nerve pain you have been having.   We will see you back with your next cycle of treatment - return as scheduled.    Thank you for choosing [place] at [facility] to provide your oncology and hematology care.  To afford each patient quality time with our provider, please arrive at least 15 minutes before your scheduled appointment time.   If you have a lab appointment with the [facility] please come in thru the Main Entrance and check in at the main information desk.  You need to re-schedule your appointment should you arrive 10 or more minutes late.  We strive to give you quality time with our providers, and arriving late affects you and other patients whose appointments are after yours.  Also, if you no show three or more times for appointments you may be dismissed from the clinic at the providers discretion.     Again, thank you for choosing [facility].  Our hope is that these requests will decrease the amount of time that you wait before being seen by our physicians.       _____________________________________________________________  Should you have questions after your visit to [facility], please contact our office at [phone] and follow the prompts.  Our office hours are 8:00 a.m. and 4:30 p.m. Monday - Friday.  Please note that voicemails left after 4:00 p.m. may not be returned until the following business day.  We are closed weekends and major holidays.  You do have access to a nurse 24-7, just call the  main number to the clinic [phone] and do not press any options, hold on the line and a nurse will answer the phone.    For prescription refill requests, have your pharmacy contact our office and allow 72 hours.    Due to Covid, you will need to wear a mask upon entering the hospital. If you do not have a mask, a mask will be given to you at the Main Entrance upon arrival. For doctor visits, patients may have 1 support person age 84 or older with them. For treatment visits, patients can not have anyone with them due to social distancing guidelines and our immunocompromised population.

## 2021-02-26 ENCOUNTER — Encounter (HOSPITAL_COMMUNITY): Payer: Self-pay | Admitting: Hematology

## 2021-03-04 NOTE — Progress Notes (Signed)
Rebecca [name], [place] [address]   CLINIC:  Medical Oncology/Hematology  PCP:  Everitt Amber, MD [address] / [place] [address] [phone]   REASON FOR VISIT:  Follow-up for ovarian cancer  PRIOR THERAPY: Exploratory laparotomy, BSO, and infra-gastric omentectomy on 12/19/2020 with Dr. Denman George  NGS Results: not done  CURRENT THERAPY: Carboplatin (AUC 6) / Paclitaxel (175) q21d x 6 cycles  BRIEF ONCOLOGIC HISTORY:  Oncology History  Ovarian cancer on left [facility])  01/07/2021 Initial Diagnosis   Ovarian cancer on left [facility])   01/30/2021 Cancer Staging   Staging form: Ovary, Fallopian Tube, and Primary Peritoneal Carcinoma, AJCC 8th Edition - Clinical stage from 01/30/2021: FIGO Stage IIIB (cT3b, cNX, cM0) - Signed by Derek Jack, MD on 01/30/2021 Histologic grade (G): G3 Histologic grading system: 4 grade system    02/11/2021 -  Chemotherapy   Patient is on Treatment Plan : OVARIAN Carboplatin (AUC 6) / Paclitaxel (175) q21d x 6 cycles       CANCER STAGING:  Cancer Staging  Ovarian cancer on left [facility]) Staging form: Ovary, Fallopian Tube, and Primary Peritoneal Carcinoma, AJCC 8th Edition - Clinical stage from 01/30/2021: FIGO Stage IIIB (cT3b, cNX, cM0) - Signed by Derek Jack, MD on 01/30/2021   INTERVAL HISTORY:  [name], a 76 y.o. female, returns for routine follow-up and consideration for next cycle of chemotherapy. Alexismarie was last seen on 02/21/2021.  Due for cycle #2 of Carboplatin and Taxol today.   Overall, she tells me she has been feeling pretty well. She reports constant numbness in her fingers and toes, but this not current affecting her finer motor skills; the pins-and-needles sensation in her finger and toes has resolved with Gabapentin. She reports improving pain at her port site. She reports rectal bleeding when wiping with toilet paper. She reports cramping in her legs. She reports  hair thinning. She reports abdominal swelling. She reports occasional leg swelling.   Overall, she feels ready for next cycle of chemo today.    REVIEW OF SYSTEMS:  Review of Systems  Constitutional:  Positive for fatigue (40%). Negative for appetite change (60%).  Respiratory:  Positive for shortness of breath.   Cardiovascular:  Positive for chest pain (8/10 @ port site).  Gastrointestinal:  Positive for abdominal distention, blood in stool and nausea.  Neurological:  Positive for dizziness, headaches and numbness.  Psychiatric/Behavioral:  The patient is nervous/anxious.   All other systems reviewed and are negative.  PAST MEDICAL/SURGICAL HISTORY:  Past Medical History:  Diagnosis Date   Anemia    CAD (coronary artery disease)    DM2 (diabetes mellitus, type 2) (HCC)    GERD (gastroesophageal reflux disease)    HTN (hypertension)    Ovarian ca ([place])    Port-A-Cath in place 02/02/2021   Past Surgical History:  Procedure Laterality Date   IR IMAGING GUIDED PORT INSERTION  02/08/2021    SOCIAL HISTORY:  Social History   Socioeconomic History   Marital status: Single    Spouse name: Not on file   Number of children: Not on file   Years of education: Not on file   Highest education level: Not on file  Occupational History   Not on file  Tobacco Use   Smoking status: Not on file   Smokeless tobacco: Not on file  Substance and Sexual Activity   Alcohol use: Not on file   Drug use: Not on file   Sexual activity:  Not on file  Other Topics Concern   Not on file  Social History Narrative   Not on file   Social Determinants of Health   Financial Resource Strain: Low Risk    Difficulty of Paying Living Expenses: Not hard at all  Food Insecurity: No Food Insecurity   Worried About Running Out of Food in the Last Year: Never true   Ran Out of Food in the Last Year: Never true  Transportation Needs: Unmet Transportation Needs   Lack of Transportation (Medical): Yes    Lack of Transportation (Non-Medical): No  Physical Activity: Not on file  Stress: Stress Concern Present   Feeling of Stress : To some extent  Social Connections: Moderately Isolated   Frequency of Communication with Friends and Family: More than three times a week   Frequency of Social Gatherings with Friends and Family: More than three times a week   Attends Religious Services: More than 4 times per year   Active Member of [employer] or Organizations: No   Attends Archivist Meetings: Never   Marital Status: Widowed  Intimate Partner Violence: Not on file    FAMILY HISTORY:  No family history on file.  CURRENT MEDICATIONS:  Current Outpatient Medications  Medication Sig Dispense Refill   acetaminophen (TYLENOL) 325 MG tablet Take 975 mg by mouth every 6 (six) hours as needed for moderate pain.     atorvastatin (LIPITOR) 40 MG tablet Take 40 mg by mouth at bedtime.     azelastine (ASTELIN) 0.1 % nasal spray Place 1 spray into both nostrils 2 (two) times daily as needed for allergies.     baclofen (LIORESAL) 10 MG tablet Take 10 mg by mouth 3 (three) times daily as needed for muscle spasms.     CARBOPLATIN IV Inject into the vein every 21 ( twenty-one) days.     clopidogrel (PLAVIX) 75 MG tablet Take 75 mg by mouth daily.     diphenhydrAMINE (BENADRYL) 25 MG tablet Take 50 mg by mouth daily as needed for allergies or itching.     diphenhydrAMINE-zinc acetate (BENADRYL) cream Apply 1 application topically 3 (three) times daily as needed for itching.     docusate sodium (COLACE) 100 MG capsule Take 1 capsule (100 mg total) by mouth daily as needed for mild constipation. 30 capsule 1   ferrous sulfate 325 (65 FE) MG tablet Take 325 mg by mouth every evening.     furosemide (LASIX) 20 MG tablet Take 20 mg by mouth daily as needed for edema.     gabapentin (NEURONTIN) 300 MG capsule Take 1 capsule (300 mg total) by mouth 2 (two) times daily. 60 capsule 2   HYDROcodone-acetaminophen  (NORCO/VICODIN) 5-325 MG tablet Take 1 tablet by mouth every 6 (six) hours as needed for moderate pain. 60 tablet 0   insulin degludec (TRESIBA FLEXTOUCH) 100 UNIT/ML FlexTouch Pen Inject 19 Units into the skin at bedtime.     JANUVIA 100 MG tablet Take 100 mg by mouth daily.     losartan (COZAAR) 50 MG tablet Take 50 mg by mouth at bedtime.     magnesium oxide (MAG-OX) 400 (240 Mg) MG tablet Take 1 tablet (400 mg total) by mouth 2 (two) times daily. 60 tablet 3   metFORMIN (GLUCOPHAGE) 1000 MG tablet Take 1,000 mg by mouth daily.     MYRBETRIQ 50 MG TB24 tablet Take 50 mg by mouth daily.     PACLITAXEL IV Inject into the vein every 21 (  twenty-one) days.     pantoprazole (PROTONIX) 20 MG tablet Take 20 mg by mouth daily.     predniSONE (DELTASONE) 50 MG tablet Take 13hrs, 7hrs and 1hr before port placement 3 tablet 0   Tetrahydrozoline HCl (REDNESS RELIEVER EYE DROPS OP) Place 1 drop into both eyes daily as needed (redness).     traMADol (ULTRAM) 50 MG tablet Take 2 tablets (100 mg total) by mouth every 6 (six) hours as needed. 240 tablet 0   lidocaine-prilocaine (EMLA) cream Apply a small amount to port a cath site and cover with plastic wrap 1 hour prior to chemotherapy appointments (Patient not taking: Reported on 03/05/2021) 30 g 3   prochlorperazine (COMPAZINE) 10 MG tablet Take 1 tablet (10 mg total) by mouth every 6 (six) hours as needed (Nausea or vomiting). (Patient not taking: Reported on 03/05/2021) 30 tablet 1   No current facility-administered medications for this visit.    ALLERGIES:  Allergies  Allergen Reactions   Aspirin Hives   Iodinated Contrast Media Itching    Rash    Mometasone Itching   Naproxen Sodium Itching   Penicillin G Swelling   Triamcinolone Itching   Codeine Palpitations   Ibuprofen Rash    PHYSICAL EXAM:  Performance status (ECOG): 1 - Symptomatic but completely ambulatory  Vitals:   03/05/21 0804  BP: (!) 116/57  Pulse: 82  Resp: 18  Temp: 98.9  F (37.2 C)  SpO2: 98%   Wt Readings from Last 3 Encounters:  03/05/21 183 lb 12.8 oz (83.4 kg)  02/21/21 178 lb 2.1 oz (80.8 kg)  02/11/21 178 lb 12.7 oz (81.1 kg)   Physical Exam Vitals reviewed.  Constitutional:      Appearance: Normal appearance.  Cardiovascular:     Rate and Rhythm: Normal rate and regular rhythm.     Pulses: Normal pulses.     Heart sounds: Normal heart sounds.  Pulmonary:     Effort: Pulmonary effort is normal.     Breath sounds: Normal breath sounds.  Abdominal:     Palpations: Abdomen is soft. There is no hepatomegaly, splenomegaly or mass.     Tenderness: There is no abdominal tenderness.  Musculoskeletal:     Right lower leg: No edema.     Left lower leg: No edema.  Neurological:     General: No focal deficit present.     Mental Status: She is alert and oriented to person, place, and time.  Psychiatric:        Mood and Affect: Mood normal.        Behavior: Behavior normal.    LABORATORY DATA:  I have reviewed the labs as listed.  CBC Latest Ref Rng & Units 03/05/2021 02/21/2021 02/11/2021  WBC 4.0 - 10.5 K/uL 7.4 4.1 8.4  Hemoglobin 12.0 - 15.0 g/dL 10.2(L) 10.6(L) 10.6(L)  Hematocrit 36.0 - 46.0 % 29.7(L) 32.1(L) 32.0(L)  Platelets 150 - 400 K/uL 220 229 258   CMP Latest Ref Rng & Units 03/05/2021 02/21/2021 02/11/2021  Glucose 70 - 99 mg/dL 157(H) 155(H) 168(H)  BUN 8 - 23 mg/dL 30(H) 14 14  Creatinine 0.44 - 1.00 mg/dL 0.87 0.83 0.82  Sodium 135 - 145 mmol/L 138 133(L) 137  Potassium 3.5 - 5.1 mmol/L 4.8 4.6 3.7  Chloride 98 - 111 mmol/L 104 101 108  CO2 22 - 32 mmol/L 25 24 26   Calcium 8.9 - 10.3 mg/dL 9.7 9.4 8.9  Total Protein 6.5 - 8.1 g/dL 7.4 7.8 6.7  Total Bilirubin  0.3 - 1.2 mg/dL 0.2(L) 0.3 0.4  Alkaline Phos 38 - 126 U/L 83 93 80  AST 15 - 41 U/L 14(L) 16 11(L)  ALT 0 - 44 U/L 16 21 13     DIAGNOSTIC IMAGING:  I have independently reviewed the scans and discussed with the patient. IR IMAGING GUIDED PORT  INSERTION  Result Date: 02/08/2021 INDICATION: 76 year old woman with ovarian malignancy presents to IR for chest port placement EXAM: IMPLANTED PORT A CATH PLACEMENT WITH ULTRASOUND AND FLUOROSCOPIC GUIDANCE MEDICATIONS: None ANESTHESIA/SEDATION: Moderate (conscious) sedation was employed during this procedure. A total of Versed 2 mg and Fentanyl 100 mcg was administered intravenously. Moderate Sedation Time: 16 minutes. The patient's level of consciousness and vital signs were monitored continuously by radiology nursing throughout the procedure under my direct supervision. FLUOROSCOPY TIME:  0 minutes, 30 seconds (1.9 mGy) COMPLICATIONS: None immediate. PROCEDURE: The procedure, risks, benefits, and alternatives were explained to the patient. Questions regarding the procedure were encouraged and answered. The patient understands and consents to the procedure. A timeout was performed prior to the initiation of the procedure. Patient positioned supine on the angiography table. Right neck and anterior upper chest prepped and draped in the usual sterile fashion. All elements of maximal sterile barrier were utilized including, cap, mask, sterile gown, sterile gloves, large sterile drape, hand scrubbing and 2% Chlorhexidine for skin cleaning. The right internal jugular vein was evaluated with ultrasound and shown to be patent. A permanent ultrasound image was obtained and placed in the patient's medical record. Local anesthesia was provided with 1% lidocaine with epinephrine. Using sterile gel and a sterile probe cover, the right internal jugular vein was entered with a 21 ga needle during real time ultrasound guidance. 0.018 inch guidewire placed and 21 ga needle exchanged for transitional dilator set. Utilizing fluoroscopy, 0.035 inch guidewire advanced through the needle without difficulty. Attention then turned to the right anterior upper chest. Following local lidocaine administration, a port pocket was created.  The catheter was connected to the port and brought from the pocket to the venotomy site through a subcutaneous tunnel. The catheter was cut to size and inserted through the peel-away sheath. The catheter tip was positioned at the cavoatrial junction using fluoroscopic guidance. The port aspirated and flushed well. The port pocket was closed with deep and superficial absorbable suture. The port pocket incision and venotomy sites were also sealed with Dermabond. IMPRESSION: Successful placement of a right internal jugular approach power injectable Port-A-Cath. The catheter is ready for immediate use. Electronically Signed   By: Miachel Roux M.D.   On: 02/08/2021 12:38     ASSESSMENT:  Stage IIIb high-grade serous ovarian carcinoma: - Presentation with abdominal distention and bloating sensation. - CT abdomen at outside hospital on 12/05/2020 with findings concerning for ovarian neoplasm. - CT chest without contrast on 12/18/2020 with scattered solid pulmonary nodules measuring up to 3 mm, indeterminate. - CA125 on 12/05/2020-10000 - 12/18/2020 exploratory laparotomy, bilateral salpingo-oophorectomy, infra gastric omentectomy, R0 primary tumor debulking by Dr. Denman George at [facility]. - Pathology-left ovary high-grade serous carcinoma, greatest dimension 26.5 cm, left ovarian capsule intact.  Fallopian tube surface involvement present, omentum involvement present, largest extrapelvic peritoneal focus-macroscopic, peritoneal/ascitic fluid involvement negative.  PT3BPNX.  FIGO stage IIIb. - She was evaluated for FLORA 5 clinical trial, found not to be a candidate. - Germline mutation testing was negative.     Social/family history: - She is seen with her daughter-in-law today.  She lives by herself.  She ambulates with the  help of cane.  She retired after working at [employer].  She is non-smoker. - Brother had prostate cancer.  Another brother had throat cancer.  Sister had kidney cancer.  Grandson had  bladder cancer at age 21.  Paternal uncle had head and neck cancer.   PLAN:  Stage IIIb high-grade serous ovarian carcinoma: - She has tolerated first cycle reasonably well except some pains in the extremities. - We reviewed labs today which showed normal LFTs and creatinine.  CBC was grossly normal. - Last CA125 was 18. - She will proceed with cycle 2 today with dose reduction of paclitaxel. - RTC 3 weeks for follow-up.  2.  Hypomagnesemia: - Magnesium is 1.3 today.  She will receive magnesium 4 g IV. - We will start magnesium 400 twice daily.  3.  Type 2 diabetes: - Continue metformin, Januvia and Tresiba.  4.  Peripheral neuropathy: - Continue gabapentin 300 mg twice daily.  Pins and needle sensation has improved.  She has some numbness in the feet and fingertips not affecting activities.  5.  Taxol induced leg pains: - She will use hydrocodone 5/325 1 to 2 tablets every 6 hours as needed. - We will cut back on paclitaxel by 20%.   Orders placed this encounter:  No orders of the defined types were placed in this encounter.    Derek Jack, MD [place] [phone]   I, Thana Ates, am acting as a scribe for Dr. Derek Jack.  I, Derek Jack MD, have reviewed the above documentation for accuracy and completeness, and I agree with the above.

## 2021-03-05 ENCOUNTER — Inpatient Hospital Stay (HOSPITAL_BASED_OUTPATIENT_CLINIC_OR_DEPARTMENT_OTHER): Payer: Medicare Other | Admitting: Hematology

## 2021-03-05 ENCOUNTER — Other Ambulatory Visit: Payer: Self-pay

## 2021-03-05 ENCOUNTER — Inpatient Hospital Stay (HOSPITAL_COMMUNITY): Payer: Medicare Other | Attending: Hematology

## 2021-03-05 ENCOUNTER — Inpatient Hospital Stay (HOSPITAL_COMMUNITY): Payer: Medicare Other

## 2021-03-05 VITALS — BP 141/69 | HR 100 | Temp 98.0°F | Resp 18

## 2021-03-05 VITALS — BP 116/57 | HR 82 | Temp 98.9°F | Resp 18 | Ht 62.0 in | Wt 183.8 lb

## 2021-03-05 DIAGNOSIS — C562 Malignant neoplasm of left ovary: Secondary | ICD-10-CM

## 2021-03-05 DIAGNOSIS — Z79899 Other long term (current) drug therapy: Secondary | ICD-10-CM | POA: Insufficient documentation

## 2021-03-05 DIAGNOSIS — Z5111 Encounter for antineoplastic chemotherapy: Secondary | ICD-10-CM | POA: Diagnosis present

## 2021-03-05 DIAGNOSIS — Z7984 Long term (current) use of oral hypoglycemic drugs: Secondary | ICD-10-CM | POA: Insufficient documentation

## 2021-03-05 DIAGNOSIS — E1142 Type 2 diabetes mellitus with diabetic polyneuropathy: Secondary | ICD-10-CM | POA: Diagnosis not present

## 2021-03-05 DIAGNOSIS — Z794 Long term (current) use of insulin: Secondary | ICD-10-CM | POA: Diagnosis not present

## 2021-03-05 DIAGNOSIS — Z95828 Presence of other vascular implants and grafts: Secondary | ICD-10-CM

## 2021-03-05 LAB — CBC WITH DIFFERENTIAL/PLATELET
Abs Immature Granulocytes: 0.08 10*3/uL — ABNORMAL HIGH (ref 0.00–0.07)
Basophils Absolute: 0 10*3/uL (ref 0.0–0.1)
Basophils Relative: 1 %
Eosinophils Absolute: 0.1 10*3/uL (ref 0.0–0.5)
Eosinophils Relative: 1 %
HCT: 29.7 % — ABNORMAL LOW (ref 36.0–46.0)
Hemoglobin: 10.2 g/dL — ABNORMAL LOW (ref 12.0–15.0)
Immature Granulocytes: 1 %
Lymphocytes Relative: 26 %
Lymphs Abs: 2 10*3/uL (ref 0.7–4.0)
MCH: 31.4 pg (ref 26.0–34.0)
MCHC: 34.3 g/dL (ref 30.0–36.0)
MCV: 91.4 fL (ref 80.0–100.0)
Monocytes Absolute: 0.8 10*3/uL (ref 0.1–1.0)
Monocytes Relative: 11 %
Neutro Abs: 4.4 10*3/uL (ref 1.7–7.7)
Neutrophils Relative %: 60 %
Platelets: 220 10*3/uL (ref 150–400)
RBC: 3.25 MIL/uL — ABNORMAL LOW (ref 3.87–5.11)
RDW: 15.7 % — ABNORMAL HIGH (ref 11.5–15.5)
WBC: 7.4 10*3/uL (ref 4.0–10.5)
nRBC: 0 % (ref 0.0–0.2)

## 2021-03-05 LAB — COMPREHENSIVE METABOLIC PANEL
ALT: 16 U/L (ref 0–44)
AST: 14 U/L — ABNORMAL LOW (ref 15–41)
Albumin: 4 g/dL (ref 3.5–5.0)
Alkaline Phosphatase: 83 U/L (ref 38–126)
Anion gap: 9 (ref 5–15)
BUN: 30 mg/dL — ABNORMAL HIGH (ref 8–23)
CO2: 25 mmol/L (ref 22–32)
Calcium: 9.7 mg/dL (ref 8.9–10.3)
Chloride: 104 mmol/L (ref 98–111)
Creatinine, Ser: 0.87 mg/dL (ref 0.44–1.00)
GFR, Estimated: >60 mL/min (ref >60)
Glucose, Bld: 157 mg/dL — ABNORMAL HIGH (ref 70–99)
Potassium: 4.8 mmol/L (ref 3.5–5.1)
Sodium: 138 mmol/L (ref 135–145)
Total Bilirubin: 0.2 mg/dL — ABNORMAL LOW (ref 0.3–1.2)
Total Protein: 7.4 g/dL (ref 6.5–8.1)

## 2021-03-05 LAB — MAGNESIUM: Magnesium: 1.3 mg/dL — ABNORMAL LOW (ref 1.7–2.4)

## 2021-03-05 MED ORDER — HEPARIN SOD (PORK) LOCK FLUSH 100 UNIT/ML IV SOLN
500.0000 [IU] | Freq: Once | INTRAVENOUS | Status: AC | PRN
  Administered 2021-03-05: 500 [IU]

## 2021-03-05 MED ORDER — ACETAMINOPHEN 325 MG PO TABS: 975.0000 mg | ORAL_TABLET | Freq: Four times a day (QID) | ORAL | 2 refills | Status: AC | PRN

## 2021-03-05 MED ORDER — SODIUM CHLORIDE 0.9 % IV SOLN
449.5000 mg | Freq: Once | INTRAVENOUS | Status: AC
  Administered 2021-03-05: 450 mg via INTRAVENOUS
  Filled 2021-03-05: qty 45

## 2021-03-05 MED ORDER — SODIUM CHLORIDE 0.9 % IV SOLN: Freq: Once | INTRAVENOUS | Status: AC

## 2021-03-05 MED ORDER — SODIUM CHLORIDE 0.9 % IV SOLN
140.0000 mg/m2 | Freq: Once | INTRAVENOUS | Status: AC
  Administered 2021-03-05: 264 mg via INTRAVENOUS
  Filled 2021-03-05: qty 44

## 2021-03-05 MED ORDER — DIPHENHYDRAMINE HCL 50 MG/ML IJ SOLN
50.0000 mg | Freq: Once | INTRAMUSCULAR | Status: AC
  Administered 2021-03-05: 50 mg via INTRAVENOUS
  Filled 2021-03-05: qty 1

## 2021-03-05 MED ORDER — PALONOSETRON HCL INJECTION 0.25 MG/5ML
0.2500 mg | Freq: Once | INTRAVENOUS | Status: AC
  Administered 2021-03-05: 0.25 mg via INTRAVENOUS
  Filled 2021-03-05: qty 5

## 2021-03-05 MED ORDER — SODIUM CHLORIDE 0.9 % IV SOLN
10.0000 mg | Freq: Once | INTRAVENOUS | Status: AC
  Administered 2021-03-05: 10 mg via INTRAVENOUS
  Filled 2021-03-05: qty 10

## 2021-03-05 MED ORDER — FAMOTIDINE 20 MG IN NS 100 ML IVPB: 20.0000 mg | Freq: Once | INTRAVENOUS | Status: DC

## 2021-03-05 MED ORDER — MAGNESIUM SULFATE 2 GM/50ML IV SOLN
2.0000 g | INTRAVENOUS | Status: AC
  Administered 2021-03-05 (×2): 2 g via INTRAVENOUS
  Filled 2021-03-05: qty 50

## 2021-03-05 MED ORDER — MAGNESIUM OXIDE -MG SUPPLEMENT 400 (240 MG) MG PO TABS: 400.0000 mg | ORAL_TABLET | Freq: Two times a day (BID) | ORAL | 3 refills | Status: DC

## 2021-03-05 MED ORDER — DOCUSATE SODIUM 100 MG PO CAPS: 100.0000 mg | ORAL_CAPSULE | Freq: Every day | ORAL | 1 refills | Status: DC | PRN

## 2021-03-05 MED ORDER — FAMOTIDINE IN NACL 20-0.9 MG/50ML-% IV SOLN
20.0000 mg | Freq: Once | INTRAVENOUS | Status: AC
  Administered 2021-03-05: 20 mg via INTRAVENOUS
  Filled 2021-03-05: qty 50

## 2021-03-05 MED ORDER — SODIUM CHLORIDE 0.9 % IV SOLN
150.0000 mg | Freq: Once | INTRAVENOUS | Status: AC
  Administered 2021-03-05: 150 mg via INTRAVENOUS
  Filled 2021-03-05: qty 150

## 2021-03-05 MED ORDER — SODIUM CHLORIDE 0.9% FLUSH
10.0000 mL | INTRAVENOUS | Status: DC | PRN
  Administered 2021-03-05: 10 mL

## 2021-03-05 NOTE — Progress Notes (Signed)
Mag level today 1.3  Orders received for patient to receive 4 gm Magnesium sulfate IVPB today.  T.O Dr Rhys Martini, PharmD

## 2021-03-05 NOTE — Progress Notes (Signed)
Chaplain engaged in an initial visit with Lucita Ferrara.  Katrena shared about losing her best friend and husband, both to cancer.  She also talked about her own healthcare journey and how hard chemo has been on her body since her first treatment.  Etha voiced that losing her long blonde hair has been hard.  She received some cute hats for Christmas and has been wearing them lately.  Chaplain also spoke to her about wigs and if she had any interest in those.  Chaplain let her know where to find some affordable wigs.    Chaplain offered listening, support and presence ans Nakenya shared about her life, grief, and health.     03/05/21 1100  Clinical Encounter Type  Visited With Patient  Visit Type Initial

## 2021-03-05 NOTE — Progress Notes (Signed)
Patient has been examined by Dr. [name], and vital signs and labs have been reviewed. ANC, Creatinine, LFTs, hemoglobin, and platelets are within treatment parameters per M.D. - pt may proceed with treatment.    °

## 2021-03-05 NOTE — Progress Notes (Signed)
Pt presents today for chemotherapy per provider's order. Vital signs and other labs WNL for treatment. Taxol dose reduced by 20%. Pt's MG 1.3 per Dr.K standing order patient will receive 4g IV magnesium sulfate. Okay to proceed with treatment per Dr.K.  Taxol, Carbo, and 4 g IV x 1 dose given today per MD orders. Tolerated infusion without adverse affects. Vital signs stable. No complaints at this time. Discharged from clinic in wheelchair stable condition. Alert and oriented x 3. F/U with [facility] as scheduled.

## 2021-03-05 NOTE — Patient Instructions (Signed)
[place] at [facility] Discharge Instructions   You were seen and examined today by Dr. Delton Coombes. He reviewed your lab work - you magnesium was low.  We have sent a prescription to your pharmacy for magnesium.  Take twice a day as prescribed. Continue gabapentin as prescribed to help with pins and needles. We will proceed with your treatment today - we will dose reduce your Taxol chemotherapy to help prevent worsening of pins and needles.  Return as scheduled.     Thank you for choosing [place] at [facility] to provide your oncology and hematology care.  To afford each patient quality time with our provider, please arrive at least 15 minutes before your scheduled appointment time.   If you have a lab appointment with the [facility] please come in thru the Main Entrance and check in at the main information desk.  You need to re-schedule your appointment should you arrive 10 or more minutes late.  We strive to give you quality time with our providers, and arriving late affects you and other patients whose appointments are after yours.  Also, if you no show three or more times for appointments you may be dismissed from the clinic at the providers discretion.     Again, thank you for choosing [facility].  Our hope is that these requests will decrease the amount of time that you wait before being seen by our physicians.       _____________________________________________________________  Should you have questions after your visit to [facility], please contact our office at [phone] and follow the prompts.  Our office hours are 8:00 a.m. and 4:30 p.m. Monday - Friday.  Please note that voicemails left after 4:00 p.m. may not be returned until the following business day.  We are closed weekends and major holidays.  You do have access to a nurse 24-7, just call the main number to the clinic [phone] and do not  press any options, hold on the line and a nurse will answer the phone.    For prescription refill requests, have your pharmacy contact our office and allow 72 hours.    Due to Covid, you will need to wear a mask upon entering the hospital. If you do not have a mask, a mask will be given to you at the Main Entrance upon arrival. For doctor visits, patients may have 1 support person age 76 or older with them. For treatment visits, patients can not have anyone with them due to social distancing guidelines and our immunocompromised population.

## 2021-03-06 LAB — CA 125: Cancer Antigen (CA) 125: 16.4 U/mL (ref 0.0–38.1)

## 2021-03-26 ENCOUNTER — Inpatient Hospital Stay (HOSPITAL_BASED_OUTPATIENT_CLINIC_OR_DEPARTMENT_OTHER): Payer: Medicare Other | Admitting: Hematology

## 2021-03-26 ENCOUNTER — Inpatient Hospital Stay (HOSPITAL_COMMUNITY): Payer: Medicare Other

## 2021-03-26 ENCOUNTER — Other Ambulatory Visit: Payer: Self-pay

## 2021-03-26 VITALS — BP 154/62 | HR 93 | Temp 98.3°F | Resp 20 | Ht 62.99 in | Wt 194.2 lb

## 2021-03-26 VITALS — BP 160/74 | HR 86 | Temp 96.9°F | Resp 18

## 2021-03-26 DIAGNOSIS — C562 Malignant neoplasm of left ovary: Secondary | ICD-10-CM

## 2021-03-26 DIAGNOSIS — R300 Dysuria: Secondary | ICD-10-CM

## 2021-03-26 DIAGNOSIS — Z95828 Presence of other vascular implants and grafts: Secondary | ICD-10-CM

## 2021-03-26 DIAGNOSIS — Z5111 Encounter for antineoplastic chemotherapy: Secondary | ICD-10-CM | POA: Diagnosis not present

## 2021-03-26 DIAGNOSIS — R8290 Unspecified abnormal findings in urine: Secondary | ICD-10-CM

## 2021-03-26 LAB — URINALYSIS, COMPLETE (UACMP) WITH MICROSCOPIC
Bilirubin Urine: NEGATIVE
Glucose, UA: NEGATIVE mg/dL
Ketones, ur: NEGATIVE mg/dL
Nitrite: NEGATIVE
Protein, ur: 30 mg/dL — AB
Specific Gravity, Urine: 1.016 (ref 1.005–1.030)
WBC, UA: >50 WBC/hpf — ABNORMAL HIGH (ref 0–5)
pH: 6 (ref 5.0–8.0)

## 2021-03-26 LAB — CBC WITH DIFFERENTIAL/PLATELET
Abs Immature Granulocytes: 0.15 10*3/uL — ABNORMAL HIGH (ref 0.00–0.07)
Basophils Absolute: 0 10*3/uL (ref 0.0–0.1)
Basophils Relative: 1 %
Eosinophils Absolute: 0.1 10*3/uL (ref 0.0–0.5)
Eosinophils Relative: 1 %
HCT: 28.7 % — ABNORMAL LOW (ref 36.0–46.0)
Hemoglobin: 9.6 g/dL — ABNORMAL LOW (ref 12.0–15.0)
Immature Granulocytes: 2 %
Lymphocytes Relative: 22 %
Lymphs Abs: 1.8 10*3/uL (ref 0.7–4.0)
MCH: 32 pg (ref 26.0–34.0)
MCHC: 33.4 g/dL (ref 30.0–36.0)
MCV: 95.7 fL (ref 80.0–100.0)
Monocytes Absolute: 0.9 10*3/uL (ref 0.1–1.0)
Monocytes Relative: 11 %
Neutro Abs: 5.1 10*3/uL (ref 1.7–7.7)
Neutrophils Relative %: 63 %
Platelets: 189 10*3/uL (ref 150–400)
RBC: 3 MIL/uL — ABNORMAL LOW (ref 3.87–5.11)
RDW: 16.1 % — ABNORMAL HIGH (ref 11.5–15.5)
WBC: 7.9 10*3/uL (ref 4.0–10.5)
nRBC: 0.3 % — ABNORMAL HIGH (ref 0.0–0.2)

## 2021-03-26 LAB — COMPREHENSIVE METABOLIC PANEL
ALT: 14 U/L (ref 0–44)
AST: 14 U/L — ABNORMAL LOW (ref 15–41)
Albumin: 3.6 g/dL (ref 3.5–5.0)
Alkaline Phosphatase: 93 U/L (ref 38–126)
Anion gap: 4 — ABNORMAL LOW (ref 5–15)
BUN: 18 mg/dL (ref 8–23)
CO2: 26 mmol/L (ref 22–32)
Calcium: 8.7 mg/dL — ABNORMAL LOW (ref 8.9–10.3)
Chloride: 105 mmol/L (ref 98–111)
Creatinine, Ser: 0.89 mg/dL (ref 0.44–1.00)
GFR, Estimated: >60 mL/min (ref >60)
Glucose, Bld: 165 mg/dL — ABNORMAL HIGH (ref 70–99)
Potassium: 4.4 mmol/L (ref 3.5–5.1)
Sodium: 135 mmol/L (ref 135–145)
Total Bilirubin: 0.1 mg/dL — ABNORMAL LOW (ref 0.3–1.2)
Total Protein: 6.9 g/dL (ref 6.5–8.1)

## 2021-03-26 LAB — MAGNESIUM: Magnesium: 1.7 mg/dL (ref 1.7–2.4)

## 2021-03-26 MED ORDER — SODIUM CHLORIDE 0.9 % IV SOLN
449.5000 mg | Freq: Once | INTRAVENOUS | Status: AC
  Administered 2021-03-26: 15:00:00 450 mg via INTRAVENOUS
  Filled 2021-03-26: qty 45

## 2021-03-26 MED ORDER — HEPARIN SOD (PORK) LOCK FLUSH 100 UNIT/ML IV SOLN
500.0000 [IU] | Freq: Once | INTRAVENOUS | Status: AC | PRN
  Administered 2021-03-26: 16:00:00 500 [IU]

## 2021-03-26 MED ORDER — SODIUM CHLORIDE 0.9 % IV SOLN
140.0000 mg/m2 | Freq: Once | INTRAVENOUS | Status: AC
  Administered 2021-03-26: 11:00:00 264 mg via INTRAVENOUS
  Filled 2021-03-26: qty 44

## 2021-03-26 MED ORDER — FAMOTIDINE IN NACL 20-0.9 MG/50ML-% IV SOLN
20.0000 mg | Freq: Once | INTRAVENOUS | Status: AC
  Administered 2021-03-26: 10:00:00 20 mg via INTRAVENOUS
  Filled 2021-03-26: qty 50

## 2021-03-26 MED ORDER — PALONOSETRON HCL INJECTION 0.25 MG/5ML
0.2500 mg | Freq: Once | INTRAVENOUS | Status: AC
  Administered 2021-03-26: 10:00:00 0.25 mg via INTRAVENOUS
  Filled 2021-03-26: qty 5

## 2021-03-26 MED ORDER — FUROSEMIDE 20 MG PO TABS: 20.0000 mg | ORAL_TABLET | Freq: Every morning | ORAL | 2 refills | Status: AC

## 2021-03-26 MED ORDER — DIPHENHYDRAMINE HCL 50 MG/ML IJ SOLN
50.0000 mg | Freq: Once | INTRAMUSCULAR | Status: AC
  Administered 2021-03-26: 10:00:00 50 mg via INTRAVENOUS
  Filled 2021-03-26: qty 1

## 2021-03-26 MED ORDER — SODIUM CHLORIDE 0.9 % IV SOLN
10.0000 mg | Freq: Once | INTRAVENOUS | Status: AC
  Administered 2021-03-26: 10:00:00 10 mg via INTRAVENOUS
  Filled 2021-03-26: qty 10

## 2021-03-26 MED ORDER — SODIUM CHLORIDE 0.9 % IV SOLN: Freq: Once | INTRAVENOUS | Status: AC

## 2021-03-26 MED ORDER — CIPROFLOXACIN HCL 500 MG PO TABS: 500.0000 mg | ORAL_TABLET | Freq: Two times a day (BID) | ORAL | 0 refills | Status: DC

## 2021-03-26 MED ORDER — SODIUM CHLORIDE 0.9 % IV SOLN
150.0000 mg | Freq: Once | INTRAVENOUS | Status: AC
  Administered 2021-03-26: 10:00:00 150 mg via INTRAVENOUS
  Filled 2021-03-26: qty 150

## 2021-03-26 MED ORDER — SODIUM CHLORIDE 0.9% FLUSH
10.0000 mL | INTRAVENOUS | Status: DC | PRN
  Administered 2021-03-26: 16:00:00 10 mL

## 2021-03-26 NOTE — Patient Instructions (Signed)
[place] at [facility] Discharge Instructions   You were seen and examined today by Dr. Delton Coombes.  He reviewed your lab work with you which is normal/stable.  Your urinalysis is suspicious for infection.  We sent a prescription for Cipro to your pharmacy.  You take 1 pill twice a day until all pills are gone.  We will proceed with your treatment today.  Return as scheduled for lab work and treatment.    Thank you for choosing [place] at [facility] to provide your oncology and hematology care.  To afford each patient quality time with our provider, please arrive at least 15 minutes before your scheduled appointment time.   If you have a lab appointment with the [place] please come in thru the Main Entrance and check in at the main information desk.  You need to re-schedule your appointment should you arrive 10 or more minutes late.  We strive to give you quality time with our providers, and arriving late affects you and other patients whose appointments are after yours.  Also, if you no show three or more times for appointments you may be dismissed from the clinic at the providers discretion.     Again, thank you for choosing [facility].  Our hope is that these requests will decrease the amount of time that you wait before being seen by our physicians.       _____________________________________________________________  Should you have questions after your visit to [facility], please contact our office at [phone] and follow the prompts.  Our office hours are 8:00 a.m. and 4:30 p.m. Monday - Friday.  Please note that voicemails left after 4:00 p.m. may not be returned until the following business day.  We are closed weekends and major holidays.  You do have access to a nurse 24-7, just call the main number to the clinic [phone] and do not press any options, hold on the line and a nurse will  answer the phone.    For prescription refill requests, have your pharmacy contact our office and allow 72 hours.    Due to Covid, you will need to wear a mask upon entering the hospital. If you do not have a mask, a mask will be given to you at the Main Entrance upon arrival. For doctor visits, patients may have 1 support person age 7 or older with them. For treatment visits, patients can not have anyone with them due to social distancing guidelines and our immunocompromised population.

## 2021-03-26 NOTE — Progress Notes (Signed)
Chaplain engaged in a follow-up visit with Rebecca [name].  She shared again about how hard Chemo has been on her body.  She is read to get her treatments over with and experience some relief.  She noted that she always feels nauseous and that a couple of days after chemo her whole body hurts.  Rebecca [name] also shared about losing her hair and feeling very lonely.  She has lost some of her closest family and friends to cancer, and though she still has a lot of family left she doesn't get to see them often.  Chaplain provided some suggestions for Rebecca [name] to build some community.  Rebecca [name] also spent some time talking about the ways losing her hair has impacted her.  Her hair meant a lot to her.    Chaplain offered a blessing of peace and comfort over her.  Chaplain offered listening, presence and support.     03/26/21 1100  Clinical Encounter Type  Visited With Patient  Visit Type Follow-up

## 2021-03-26 NOTE — Progress Notes (Signed)
[place], [place] [address]   CLINIC:  Medical Oncology/Hematology  PCP:  Everitt Amber, MD [address] / [place] [address] [phone]   REASON FOR VISIT:  Follow-up for ovarian cancer  PRIOR THERAPY: Exploratory laparotomy, BSO, and infra-gastric omentectomy on 12/19/2020 with Dr. Denman George  NGS Results: not done  CURRENT THERAPY: Carboplatin (AUC 6) / Paclitaxel (175) q21d x 6 cycles  BRIEF ONCOLOGIC HISTORY:  Oncology History  Ovarian cancer on left [facility])  01/07/2021 Initial Diagnosis   Ovarian cancer on left [facility])   01/30/2021 Cancer Staging   Staging form: Ovary, Fallopian Tube, and Primary Peritoneal Carcinoma, AJCC 8th Edition - Clinical stage from 01/30/2021: FIGO Stage IIIB (cT3b, cNX, cM0) - Signed by Derek Jack, MD on 01/30/2021 Histologic grade (G): G3 Histologic grading system: 4 grade system    02/11/2021 -  Chemotherapy   Patient is on Treatment Plan : OVARIAN Carboplatin (AUC 6) / Paclitaxel (175) q21d x 6 cycles       CANCER STAGING:  Cancer Staging  Ovarian cancer on left [facility]) Staging form: Ovary, Fallopian Tube, and Primary Peritoneal Carcinoma, AJCC 8th Edition - Clinical stage from 01/30/2021: FIGO Stage IIIB (cT3b, cNX, cM0) - Signed by Derek Jack, MD on 01/30/2021   INTERVAL HISTORY:  [name], a 76 y.o. female, returns for routine follow-up and consideration for next cycle of chemotherapy. Elaysia was last seen on 03/26/2021.  Due for cycle #3 of Carboplatin and Taxol today.   Overall, she tells me she has been feeling pretty well, and she is accompanied by her son.  She reports abdominal swelling and intermittent tightness starting a week ago. She has gained 11 lbs since 01/03; she denies increasing her food intake. She reports feeling full easily while eating.   Overall, she feels ready for next cycle of chemo today.    REVIEW OF SYSTEMS:  Review of Systems   Constitutional:  Positive for unexpected weight change (+ 11 lbs). Negative for appetite change and fatigue.  Respiratory:  Positive for shortness of breath.   Gastrointestinal:  Positive for abdominal distention and nausea.  Genitourinary:  Positive for difficulty urinating.   All other systems reviewed and are negative.  PAST MEDICAL/SURGICAL HISTORY:  Past Medical History:  Diagnosis Date   Anemia    CAD (coronary artery disease)    DM2 (diabetes mellitus, type 2) (HCC)    GERD (gastroesophageal reflux disease)    HTN (hypertension)    Ovarian ca ([place])    Port-A-Cath in place 02/02/2021   Past Surgical History:  Procedure Laterality Date   IR IMAGING GUIDED PORT INSERTION  02/08/2021    SOCIAL HISTORY:  Social History   Socioeconomic History   Marital status: Single    Spouse name: Not on file   Number of children: Not on file   Years of education: Not on file   Highest education level: Not on file  Occupational History   Not on file  Tobacco Use   Smoking status: Not on file   Smokeless tobacco: Not on file  Substance and Sexual Activity   Alcohol use: Not on file   Drug use: Not on file   Sexual activity: Not on file  Other Topics Concern   Not on file  Social History Narrative   Not on file   Social Determinants of Health   Financial Resource Strain: Low Risk    Difficulty of Paying Living Expenses: Not hard at  all  Food Insecurity: No Food Insecurity   Worried About Charity fundraiser in the Last Year: Never true   Ran Out of Food in the Last Year: Never true  Transportation Needs: Unmet Transportation Needs   Lack of Transportation (Medical): Yes   Lack of Transportation (Non-Medical): No  Physical Activity: Not on file  Stress: Stress Concern Present   Feeling of Stress : To some extent  Social Connections: Moderately Isolated   Frequency of Communication with Friends and Family: More than three times a week   Frequency of Social Gatherings with  Friends and Family: More than three times a week   Attends Religious Services: More than 4 times per year   Active Member of [employer] or Organizations: No   Attends Archivist Meetings: Never   Marital Status: Widowed  Intimate Partner Violence: Not on file    FAMILY HISTORY:  No family history on file.  CURRENT MEDICATIONS:  Current Outpatient Medications  Medication Sig Dispense Refill   atorvastatin (LIPITOR) 40 MG tablet Take 40 mg by mouth at bedtime.     B-D ULTRAFINE III SHORT PEN 31G X 8 MM MISC SMARTSIG:1 Each SUB-Q Daily     CARBOPLATIN IV Inject into the vein every 21 ( twenty-one) days.     ciprofloxacin (CIPRO) 500 MG tablet Take 1 tablet (500 mg total) by mouth 2 (two) times daily. 10 tablet 0   clopidogrel (PLAVIX) 75 MG tablet Take 75 mg by mouth daily.     diphenhydrAMINE (BENADRYL) 25 MG tablet Take 50 mg by mouth daily as needed for allergies or itching.     FARXIGA 10 MG TABS tablet Take 10 mg by mouth daily.     ferrous sulfate 325 (65 FE) MG tablet Take 325 mg by mouth every evening.     furosemide (LASIX) 20 MG tablet Take 1 tablet (20 mg total) by mouth in the morning. 30 tablet 2   gabapentin (NEURONTIN) 300 MG capsule Take 1 capsule (300 mg total) by mouth 2 (two) times daily. 60 capsule 2   insulin degludec (TRESIBA FLEXTOUCH) 100 UNIT/ML FlexTouch Pen Inject 19 Units into the skin at bedtime.     JANUVIA 100 MG tablet Take 100 mg by mouth daily.     lidocaine-prilocaine (EMLA) cream Apply a small amount to port a cath site and cover with plastic wrap 1 hour prior to chemotherapy appointments 30 g 3   losartan (COZAAR) 50 MG tablet Take 50 mg by mouth at bedtime.     magnesium oxide (MAG-OX) 400 (240 Mg) MG tablet Take 1 tablet (400 mg total) by mouth 2 (two) times daily. 60 tablet 3   metFORMIN (GLUCOPHAGE) 1000 MG tablet Take 1,000 mg by mouth daily.     MYRBETRIQ 50 MG TB24 tablet Take 50 mg by mouth daily.     PACLITAXEL IV Inject into the vein  every 21 ( twenty-one) days.     pantoprazole (PROTONIX) 20 MG tablet Take 20 mg by mouth daily.     predniSONE (DELTASONE) 50 MG tablet Take 13hrs, 7hrs and 1hr before port placement 3 tablet 0   acetaminophen (TYLENOL) 325 MG tablet Take 3 tablets (975 mg total) by mouth every 6 (six) hours as needed for moderate pain. (Patient not taking: Reported on 03/26/2021) 360 tablet 2   azelastine (ASTELIN) 0.1 % nasal spray Place 1 spray into both nostrils 2 (two) times daily as needed for allergies. (Patient not taking: Reported on 03/26/2021)  baclofen (LIORESAL) 10 MG tablet Take 10 mg by mouth 3 (three) times daily as needed for muscle spasms. (Patient not taking: Reported on 03/26/2021)     diphenhydrAMINE-zinc acetate (BENADRYL) cream Apply 1 application topically 3 (three) times daily as needed for itching. (Patient not taking: Reported on 03/26/2021)     docusate sodium (COLACE) 100 MG capsule Take 1 capsule (100 mg total) by mouth daily as needed for mild constipation. (Patient not taking: Reported on 03/26/2021) 30 capsule 1   HYDROcodone-acetaminophen (NORCO/VICODIN) 5-325 MG tablet Take 1 tablet by mouth every 6 (six) hours as needed for moderate pain. (Patient not taking: Reported on 03/26/2021) 60 tablet 0   prochlorperazine (COMPAZINE) 10 MG tablet Take 1 tablet (10 mg total) by mouth every 6 (six) hours as needed (Nausea or vomiting). (Patient not taking: Reported on 03/26/2021) 30 tablet 1   Tetrahydrozoline HCl (REDNESS RELIEVER EYE DROPS OP) Place 1 drop into both eyes daily as needed (redness). (Patient not taking: Reported on 03/26/2021)     traMADol (ULTRAM) 50 MG tablet Take 2 tablets (100 mg total) by mouth every 6 (six) hours as needed. (Patient not taking: Reported on 03/26/2021) 240 tablet 0   No current facility-administered medications for this visit.   Facility-Administered Medications Ordered in Other Visits  Medication Dose Route Frequency Provider Last Rate Last Admin   0.9 %   sodium chloride infusion   Intravenous Once Derek Jack, MD       CARBOplatin (PARAPLATIN) 450 mg in sodium chloride 0.9 % 250 mL chemo infusion  450 mg Intravenous Once Derek Jack, MD       dexamethasone (DECADRON) 10 mg in sodium chloride 0.9 % 50 mL IVPB  10 mg Intravenous Once Derek Jack, MD       diphenhydrAMINE (BENADRYL) injection 50 mg  50 mg Intravenous Once Derek Jack, MD       famotidine (PEPCID) IVPB 20 mg premix  20 mg Intravenous Once Derek Jack, MD       fosaprepitant (EMEND) 150 mg in sodium chloride 0.9 % 145 mL IVPB  150 mg Intravenous Once Derek Jack, MD       heparin lock flush 100 unit/mL  500 Units Intracatheter Once PRN Derek Jack, MD       PACLitaxel (TAXOL) 264 mg in sodium chloride 0.9 % 250 mL chemo infusion (> 80mg /m2)  140 mg/m2 (Treatment Plan Recorded) Intravenous Once Derek Jack, MD       palonosetron (ALOXI) injection 0.25 mg  0.25 mg Intravenous Once Derek Jack, MD       sodium chloride flush (NS) 0.9 % injection 10 mL  10 mL Intracatheter PRN Derek Jack, MD        ALLERGIES:  Allergies  Allergen Reactions   Aspirin Hives   Iodinated Contrast Media Itching    Rash    Mometasone Itching   Naproxen Sodium Itching   Penicillin G Swelling   Triamcinolone Itching   Codeine Palpitations   Ibuprofen Rash    PHYSICAL EXAM:  Performance status (ECOG): 1 - Symptomatic but completely ambulatory  Vitals:   03/26/21 0757  BP: (!) 154/62  Pulse: 93  Resp: 20  Temp: 98.3 F (36.8 C)  SpO2: 100%   Wt Readings from Last 3 Encounters:  03/26/21 194 lb 3.2 oz (88.1 kg)  03/05/21 183 lb 12.8 oz (83.4 kg)  02/21/21 178 lb 2.1 oz (80.8 kg)   Physical Exam Vitals reviewed.  Constitutional:      Appearance: Normal  appearance. She is obese.  Cardiovascular:     Rate and Rhythm: Normal rate and regular rhythm.     Pulses: Normal pulses.     Heart sounds: Normal  heart sounds.  Pulmonary:     Effort: Pulmonary effort is normal.     Breath sounds: Normal breath sounds.  Abdominal:     General: A surgical scar is present.     Palpations: Abdomen is soft. There is no hepatomegaly, splenomegaly or mass.     Tenderness: There is no abdominal tenderness.  Musculoskeletal:     Right lower leg: No edema.     Left lower leg: No edema.  Neurological:     General: No focal deficit present.     Mental Status: She is alert and oriented to person, place, and time.  Psychiatric:        Mood and Affect: Mood normal.        Behavior: Behavior normal.    LABORATORY DATA:  I have reviewed the labs as listed.  CBC Latest Ref Rng & Units 03/26/2021 03/05/2021 02/21/2021  WBC 4.0 - 10.5 K/uL 7.9 7.4 4.1  Hemoglobin 12.0 - 15.0 g/dL 9.6(L) 10.2(L) 10.6(L)  Hematocrit 36.0 - 46.0 % 28.7(L) 29.7(L) 32.1(L)  Platelets 150 - 400 K/uL 189 220 229   CMP Latest Ref Rng & Units 03/26/2021 03/05/2021 02/21/2021  Glucose 70 - 99 mg/dL 165(H) 157(H) 155(H)  BUN 8 - 23 mg/dL 18 30(H) 14  Creatinine 0.44 - 1.00 mg/dL 0.89 0.87 0.83  Sodium 135 - 145 mmol/L 135 138 133(L)  Potassium 3.5 - 5.1 mmol/L 4.4 4.8 4.6  Chloride 98 - 111 mmol/L 105 104 101  CO2 22 - 32 mmol/L 26 25 24   Calcium 8.9 - 10.3 mg/dL 8.7(L) 9.7 9.4  Total Protein 6.5 - 8.1 g/dL 6.9 7.4 7.8  Total Bilirubin 0.3 - 1.2 mg/dL 0.1(L) 0.2(L) 0.3  Alkaline Phos 38 - 126 U/L 93 83 93  AST 15 - 41 U/L 14(L) 14(L) 16  ALT 0 - 44 U/L 14 16 21     DIAGNOSTIC IMAGING:  I have independently reviewed the scans and discussed with the patient. No results found.   ASSESSMENT:  Stage IIIb high-grade serous ovarian carcinoma: - Presentation with abdominal distention and bloating sensation. - CT abdomen at outside hospital on 12/05/2020 with findings concerning for ovarian neoplasm. - CT chest without contrast on 12/18/2020 with scattered solid pulmonary nodules measuring up to 3 mm, indeterminate. - CA125 on  12/05/2020-10000 - 12/18/2020 exploratory laparotomy, bilateral salpingo-oophorectomy, infra gastric omentectomy, R0 primary tumor debulking by Dr. Denman George at [facility]. - Pathology-left ovary high-grade serous carcinoma, greatest dimension 26.5 cm, left ovarian capsule intact.  Fallopian tube surface involvement present, omentum involvement present, largest extrapelvic peritoneal focus-macroscopic, peritoneal/ascitic fluid involvement negative.  PT3BPNX.  FIGO stage IIIb. - She was evaluated for FLORA 5 clinical trial, found not to be a candidate. - Germline mutation testing was negative.     Social/family history: - She is seen with her daughter-in-law today.  She lives by herself.  She ambulates with the help of cane.  She retired after working at [employer].  She is non-smoker. - Brother had prostate cancer.  Another brother had throat cancer.  Sister had kidney cancer.  Grandson had bladder cancer at age 74.  Paternal uncle had head and neck cancer.   PLAN:  Stage IIIb high-grade serous ovarian carcinoma: - She has tolerated cycle 2 very well.  She had pains in the  legs which lasted about 2 days.  She denies any GI symptoms. - However she gained about 11 pounds.  She complains of abdominal distention.  No evidence of ascites or ankle swellings on physical exam. - Reviewed labs today which showed normal LFTs.  CBC shows normal white count and platelet count.  CA125 was 16.4. - We will proceed with cycle 3 today with 20% dose reduction of paclitaxel. - UA was positive for leukocytes and bacteria.  We will give her Cipro 500 mg twice daily for 5 days. - Due to weight gain and abdominal distention, we will start her on Lasix 20 mg in the mornings as needed.  2.  Hypomagnesemia: - Continue magnesium 400 mg twice daily.  With magnesium today is normal.  3.  Type 2 diabetes: - Continue metformin, Januvia and Tresiba.  4.  Peripheral neuropathy: - Continue gabapentin 300 mg twice daily.  5.   Taxol induced leg pains: - At last cycle we have cut back paclitaxel by 20%. - She had pains which lasted about 2 days after last cycle. - Continue hydrocodone as needed for leg pains.   Orders placed this encounter:  No orders of the defined types were placed in this encounter.    Derek Jack, MD [place] [phone]   I, Thana Ates, am acting as a scribe for Dr. Derek Jack.  I, Derek Jack MD, have reviewed the above documentation for accuracy and completeness, and I agree with the above.

## 2021-03-26 NOTE — Patient Instructions (Signed)
[facility]  Discharge Instructions: Thank you for choosing [facility] to provide your oncology and hematology care.  If you have a lab appointment with the [facility], please come in thru the Main Entrance and check in at the main information desk.  Wear comfortable clothing and clothing appropriate for easy access to any Portacath or PICC line.   We strive to give you quality time with your provider. You may need to reschedule your appointment if you arrive late (15 or more minutes).  Arriving late affects you and other patients whose appointments are after yours.  Also, if you miss three or more appointments without notifying the office, you may be dismissed from the clinic at the providers discretion.      For prescription refill requests, have your pharmacy contact our office and allow 72 hours for refills to be completed.    Today you received the following chemotherapy and/or immunotherapy agents Taxol/Carboplatin. It is recommended that due to patient maintaining weight and d/t diabetes you should drink Glucerna or another low carb supplement, 1 per day. Return as scheduled.   To help prevent nausea and vomiting after your treatment, we encourage you to take your nausea medication as directed.  BELOW ARE SYMPTOMS THAT SHOULD BE REPORTED IMMEDIATELY: *FEVER GREATER THAN 100.4 F (38 C) OR HIGHER *CHILLS OR SWEATING *NAUSEA AND VOMITING THAT IS NOT CONTROLLED WITH YOUR NAUSEA MEDICATION *UNUSUAL SHORTNESS OF BREATH *UNUSUAL BRUISING OR BLEEDING *URINARY PROBLEMS (pain or burning when urinating, or frequent urination) *BOWEL PROBLEMS (unusual diarrhea, constipation, pain near the anus) TENDERNESS IN MOUTH AND THROAT WITH OR WITHOUT PRESENCE OF ULCERS (sore throat, sores in mouth, or a toothache) UNUSUAL RASH, SWELLING OR PAIN  UNUSUAL VAGINAL DISCHARGE OR ITCHING   Items with * indicate a potential emergency and should be followed up as soon as possible  or go to the Emergency Department if any problems should occur.  Please show the CHEMOTHERAPY ALERT CARD or IMMUNOTHERAPY ALERT CARD at check-in to the Emergency Department and triage nurse.  Should you have questions after your visit or need to cancel or reschedule your appointment, please contact [facility] [phone]  and follow the prompts.  Office hours are 8:00 a.m. to 4:30 p.m. Monday - Friday. Please note that voicemails left after 4:00 p.m. may not be returned until the following business day.  We are closed weekends and major holidays. You have access to a nurse at all times for urgent questions. Please call the main number to the clinic [phone] and follow the prompts.  For any non-urgent questions, you may also contact your provider using MyChart. We now offer e-Visits for anyone 32 and older to request care online for non-urgent symptoms. For details visit [email or web address].   Also download the MyChart app! Go to the app store, search "MyChart", open the app, select [facility], and log in with your MyChart username and password.  Due to Covid, a mask is required upon entering the hospital/clinic. If you do not have a mask, one will be given to you upon arrival. For doctor visits, patients may have 1 support person aged 73 or older with them. For treatment visits, patients cannot have anyone with them due to current Covid guidelines and our immunocompromised population.

## 2021-03-26 NOTE — Progress Notes (Signed)
Patient has been examined by Dr. [name], and vital signs and labs have been reviewed. ANC, Creatinine, LFTs, hemoglobin, and platelets are within treatment parameters per M.D. - pt may proceed with treatment.    °

## 2021-03-26 NOTE — Progress Notes (Signed)
Patient presents today for Taxol/Carbo, patietn okay for treatment today per Dr. Delton Coombes. Patient tolerated chemotherapy with no complaints voiced. Side effects with management reviewed understanding verbalized. Port site clean and dry with no bruising or swelling noted at site. Good blood return noted before and after administration of chemotherapy. Band aid applied. Patient left in satisfactory condition with VSS and no s/s of distress noted.

## 2021-03-27 LAB — CA 125: Cancer Antigen (CA) 125: 14 U/mL (ref 0.0–38.1)

## 2021-03-28 ENCOUNTER — Ambulatory Visit (HOSPITAL_COMMUNITY): Payer: Medicare Other

## 2021-03-28 ENCOUNTER — Other Ambulatory Visit (HOSPITAL_COMMUNITY): Payer: Self-pay

## 2021-03-28 MED ORDER — MISC. DEVICES MISC: 11 refills | Status: AC

## 2021-03-28 NOTE — Telephone Encounter (Signed)
Prescription for diabetic appropriate nutritional supplements and recent office visit note faxed to [facility] at [phone].

## 2021-04-16 ENCOUNTER — Inpatient Hospital Stay (HOSPITAL_BASED_OUTPATIENT_CLINIC_OR_DEPARTMENT_OTHER): Payer: Medicare Other | Admitting: Hematology

## 2021-04-16 ENCOUNTER — Inpatient Hospital Stay (HOSPITAL_COMMUNITY): Payer: Medicare Other | Attending: Hematology

## 2021-04-16 ENCOUNTER — Inpatient Hospital Stay (HOSPITAL_COMMUNITY): Payer: Medicare Other

## 2021-04-16 ENCOUNTER — Other Ambulatory Visit: Payer: Self-pay

## 2021-04-16 VITALS — BP 139/66 | HR 102 | Temp 98.7°F | Resp 18

## 2021-04-16 DIAGNOSIS — Z5111 Encounter for antineoplastic chemotherapy: Secondary | ICD-10-CM | POA: Diagnosis present

## 2021-04-16 DIAGNOSIS — C562 Malignant neoplasm of left ovary: Secondary | ICD-10-CM

## 2021-04-16 DIAGNOSIS — D649 Anemia, unspecified: Secondary | ICD-10-CM | POA: Insufficient documentation

## 2021-04-16 DIAGNOSIS — Z79899 Other long term (current) drug therapy: Secondary | ICD-10-CM | POA: Insufficient documentation

## 2021-04-16 DIAGNOSIS — Z95828 Presence of other vascular implants and grafts: Secondary | ICD-10-CM

## 2021-04-16 DIAGNOSIS — E1142 Type 2 diabetes mellitus with diabetic polyneuropathy: Secondary | ICD-10-CM | POA: Insufficient documentation

## 2021-04-16 LAB — CBC WITH DIFFERENTIAL/PLATELET
Abs Immature Granulocytes: 0.04 10*3/uL (ref 0.00–0.07)
Basophils Absolute: 0 10*3/uL (ref 0.0–0.1)
Basophils Relative: 1 %
Eosinophils Absolute: 0 10*3/uL (ref 0.0–0.5)
Eosinophils Relative: 1 %
HCT: 27.3 % — ABNORMAL LOW (ref 36.0–46.0)
Hemoglobin: 9.4 g/dL — ABNORMAL LOW (ref 12.0–15.0)
Immature Granulocytes: 1 %
Lymphocytes Relative: 30 %
Lymphs Abs: 1.5 10*3/uL (ref 0.7–4.0)
MCH: 33.3 pg (ref 26.0–34.0)
MCHC: 34.4 g/dL (ref 30.0–36.0)
MCV: 96.8 fL (ref 80.0–100.0)
Monocytes Absolute: 0.6 10*3/uL (ref 0.1–1.0)
Monocytes Relative: 11 %
Neutro Abs: 2.9 10*3/uL (ref 1.7–7.7)
Neutrophils Relative %: 56 %
Platelets: 233 10*3/uL (ref 150–400)
RBC: 2.82 MIL/uL — ABNORMAL LOW (ref 3.87–5.11)
RDW: 15.6 % — ABNORMAL HIGH (ref 11.5–15.5)
WBC: 5.1 10*3/uL (ref 4.0–10.5)
nRBC: 0 % (ref 0.0–0.2)

## 2021-04-16 LAB — COMPREHENSIVE METABOLIC PANEL
ALT: 13 U/L (ref 0–44)
AST: 11 U/L — ABNORMAL LOW (ref 15–41)
Albumin: 3.7 g/dL (ref 3.5–5.0)
Alkaline Phosphatase: 85 U/L (ref 38–126)
Anion gap: 13 (ref 5–15)
BUN: 25 mg/dL — ABNORMAL HIGH (ref 8–23)
CO2: 20 mmol/L — ABNORMAL LOW (ref 22–32)
Calcium: 9.8 mg/dL (ref 8.9–10.3)
Chloride: 105 mmol/L (ref 98–111)
Creatinine, Ser: 0.92 mg/dL (ref 0.44–1.00)
GFR, Estimated: >60 mL/min (ref >60)
Glucose, Bld: 155 mg/dL — ABNORMAL HIGH (ref 70–99)
Potassium: 4.1 mmol/L (ref 3.5–5.1)
Sodium: 138 mmol/L (ref 135–145)
Total Bilirubin: 0.4 mg/dL (ref 0.3–1.2)
Total Protein: 7.1 g/dL (ref 6.5–8.1)

## 2021-04-16 LAB — MAGNESIUM: Magnesium: 1.7 mg/dL (ref 1.7–2.4)

## 2021-04-16 MED ORDER — FAMOTIDINE IN NACL 20-0.9 MG/50ML-% IV SOLN
20.0000 mg | Freq: Once | INTRAVENOUS | Status: AC
  Administered 2021-04-16: 20 mg via INTRAVENOUS
  Filled 2021-04-16: qty 50

## 2021-04-16 MED ORDER — SODIUM CHLORIDE 0.9% FLUSH
10.0000 mL | INTRAVENOUS | Status: DC | PRN
  Administered 2021-04-16: 10 mL

## 2021-04-16 MED ORDER — SODIUM CHLORIDE 0.9 % IV SOLN
10.0000 mg | Freq: Once | INTRAVENOUS | Status: AC
  Administered 2021-04-16: 10 mg via INTRAVENOUS
  Filled 2021-04-16: qty 10

## 2021-04-16 MED ORDER — SODIUM CHLORIDE 0.9 % IV SOLN
150.0000 mg | Freq: Once | INTRAVENOUS | Status: AC
  Administered 2021-04-16: 150 mg via INTRAVENOUS
  Filled 2021-04-16: qty 150

## 2021-04-16 MED ORDER — SODIUM CHLORIDE 0.9 % IV SOLN
449.5000 mg | Freq: Once | INTRAVENOUS | Status: AC
  Administered 2021-04-16: 450 mg via INTRAVENOUS
  Filled 2021-04-16: qty 45

## 2021-04-16 MED ORDER — HEPARIN SOD (PORK) LOCK FLUSH 100 UNIT/ML IV SOLN
500.0000 [IU] | Freq: Once | INTRAVENOUS | Status: AC | PRN
  Administered 2021-04-16: 500 [IU]

## 2021-04-16 MED ORDER — SODIUM CHLORIDE 0.9 % IV SOLN: Freq: Once | INTRAVENOUS | Status: AC

## 2021-04-16 MED ORDER — SODIUM CHLORIDE 0.9 % IV SOLN
140.0000 mg/m2 | Freq: Once | INTRAVENOUS | Status: AC
  Administered 2021-04-16: 264 mg via INTRAVENOUS
  Filled 2021-04-16: qty 44

## 2021-04-16 MED ORDER — DIPHENHYDRAMINE HCL 50 MG/ML IJ SOLN
50.0000 mg | Freq: Once | INTRAMUSCULAR | Status: AC
  Administered 2021-04-16: 50 mg via INTRAVENOUS
  Filled 2021-04-16: qty 1

## 2021-04-16 MED ORDER — PALONOSETRON HCL INJECTION 0.25 MG/5ML
0.2500 mg | Freq: Once | INTRAVENOUS | Status: AC
  Administered 2021-04-16: 0.25 mg via INTRAVENOUS
  Filled 2021-04-16: qty 5

## 2021-04-16 NOTE — Progress Notes (Signed)
Patient has been examined by Dr. [name], and vital signs and labs have been reviewed. ANC, Creatinine, LFTs, hemoglobin, and platelets are within treatment parameters per M.D. - pt may proceed with treatment.    °

## 2021-04-16 NOTE — Progress Notes (Signed)
[place], [place] [address]   CLINIC:  Medical Oncology/Hematology  PCP:  Everitt Amber, MD [address] / [place] [address] [phone]   REASON FOR VISIT:  Follow-up for ovarian cancer  PRIOR THERAPY: Exploratory laparotomy, BSO, and infra-gastric omentectomy on 12/19/2020 with Dr. Denman George  NGS Results: not done  CURRENT THERAPY: Carboplatin (AUC 6) / Paclitaxel (175) q21d x 6 cycles  BRIEF ONCOLOGIC HISTORY:  Oncology History  Ovarian cancer on left [facility])  01/07/2021 Initial Diagnosis   Ovarian cancer on left [facility])   01/30/2021 Cancer Staging   Staging form: Ovary, Fallopian Tube, and Primary Peritoneal Carcinoma, AJCC 8th Edition - Clinical stage from 01/30/2021: FIGO Stage IIIB (cT3b, cNX, cM0) - Signed by Derek Jack, MD on 01/30/2021 Histologic grade (G): G3 Histologic grading system: 4 grade system    02/11/2021 -  Chemotherapy   Patient is on Treatment Plan : OVARIAN Carboplatin (AUC 6) / Paclitaxel (175) q21d x 6 cycles       CANCER STAGING:  Cancer Staging  Ovarian cancer on left [facility]) Staging form: Ovary, Fallopian Tube, and Primary Peritoneal Carcinoma, AJCC 8th Edition - Clinical stage from 01/30/2021: FIGO Stage IIIB (cT3b, cNX, cM0) - Signed by Derek Jack, MD on 01/30/2021   INTERVAL HISTORY:  [name], a 76 y.o. female, returns for routine follow-up and consideration for next cycle of chemotherapy. Marketa was last seen on 03/26/2021.  Due for cycle #4 of Carboplatin and Taxol today.   Overall, she tells me she has been feeling pretty well. She has last 3 lbs since her last visit. She reports Lasix has improved her abdominal bloating. She reports nausea which is helped by Compazine, and she denies vomiting. She is taking Gabapentin and reports improved neuropathy in her fingers and toes. She reports one episode of sharp pain in her left flank following her last treatment which was  resolved with tylenol. She has leg pains for 2 days following treatment which she reports is helped better by tylenol than hydrocodone.    Overall, she feels ready for next cycle of chemo today.   REVIEW OF SYSTEMS:  Review of Systems  Constitutional:  Negative for appetite change and fatigue.  Respiratory:  Positive for cough and shortness of breath.   Cardiovascular:  Positive for chest pain.  Gastrointestinal:  Positive for abdominal distention and nausea.  Musculoskeletal:  Positive for arthralgias (L arm; legs) and flank pain (L x1).  Skin:  Positive for itching.  Neurological:  Positive for dizziness, headaches and numbness.  Psychiatric/Behavioral:  Positive for sleep disturbance.   All other systems reviewed and are negative.  PAST MEDICAL/SURGICAL HISTORY:  Past Medical History:  Diagnosis Date   Anemia    CAD (coronary artery disease)    DM2 (diabetes mellitus, type 2) (HCC)    GERD (gastroesophageal reflux disease)    HTN (hypertension)    Ovarian ca ([place])    Port-A-Cath in place 02/02/2021   Past Surgical History:  Procedure Laterality Date   IR IMAGING GUIDED PORT INSERTION  02/08/2021    SOCIAL HISTORY:  Social History   Socioeconomic History   Marital status: Single    Spouse name: Not on file   Number of children: Not on file   Years of education: Not on file   Highest education level: Not on file  Occupational History   Not on file  Tobacco Use   Smoking status: Not on file   Smokeless  tobacco: Not on file  Substance and Sexual Activity   Alcohol use: Not on file   Drug use: Not on file   Sexual activity: Not on file  Other Topics Concern   Not on file  Social History Narrative   Not on file   Social Determinants of Health   Financial Resource Strain: Low Risk    Difficulty of Paying Living Expenses: Not hard at all  Food Insecurity: No Food Insecurity   Worried About Running Out of Food in the Last Year: Never true   Ran Out of Food in the  Last Year: Never true  Transportation Needs: Unmet Transportation Needs   Lack of Transportation (Medical): Yes   Lack of Transportation (Non-Medical): No  Physical Activity: Not on file  Stress: Stress Concern Present   Feeling of Stress : To some extent  Social Connections: Moderately Isolated   Frequency of Communication with Friends and Family: More than three times a week   Frequency of Social Gatherings with Friends and Family: More than three times a week   Attends Religious Services: More than 4 times per year   Active Member of [employer] or Organizations: No   Attends Archivist Meetings: Never   Marital Status: Widowed  Intimate Partner Violence: Not on file    FAMILY HISTORY:  No family history on file.  CURRENT MEDICATIONS:  Current Outpatient Medications  Medication Sig Dispense Refill   acetaminophen (TYLENOL) 325 MG tablet Take 3 tablets (975 mg total) by mouth every 6 (six) hours as needed for moderate pain. 360 tablet 2   atorvastatin (LIPITOR) 40 MG tablet Take 40 mg by mouth at bedtime.     azelastine (ASTELIN) 0.1 % nasal spray Place 1 spray into both nostrils 2 (two) times daily as needed for allergies.     B-D ULTRAFINE III SHORT PEN 31G X 8 MM MISC SMARTSIG:1 Each SUB-Q Daily     baclofen (LIORESAL) 10 MG tablet Take 10 mg by mouth 3 (three) times daily as needed for muscle spasms.     CARBOPLATIN IV Inject into the vein every 21 ( twenty-one) days.     ciprofloxacin (CIPRO) 500 MG tablet Take 1 tablet (500 mg total) by mouth 2 (two) times daily. 10 tablet 0   clopidogrel (PLAVIX) 75 MG tablet Take 75 mg by mouth daily.     diphenhydrAMINE (BENADRYL) 25 MG tablet Take 50 mg by mouth daily as needed for allergies or itching.     diphenhydrAMINE-zinc acetate (BENADRYL) cream Apply 1 application topically 3 (three) times daily as needed for itching.     docusate sodium (COLACE) 100 MG capsule Take 1 capsule (100 mg total) by mouth daily as needed for mild  constipation. 30 capsule 1   FARXIGA 10 MG TABS tablet Take 10 mg by mouth daily.     ferrous sulfate 325 (65 FE) MG tablet Take 325 mg by mouth every evening.     furosemide (LASIX) 20 MG tablet Take 1 tablet (20 mg total) by mouth in the morning. 30 tablet 2   gabapentin (NEURONTIN) 300 MG capsule Take 1 capsule (300 mg total) by mouth 2 (two) times daily. 60 capsule 2   HYDROcodone-acetaminophen (NORCO/VICODIN) 5-325 MG tablet Take 1 tablet by mouth every 6 (six) hours as needed for moderate pain. 60 tablet 0   insulin degludec (TRESIBA FLEXTOUCH) 100 UNIT/ML FlexTouch Pen Inject 19 Units into the skin at bedtime.     JANUVIA 100 MG tablet Take 100  mg by mouth daily.     losartan (COZAAR) 50 MG tablet Take 50 mg by mouth at bedtime.     magnesium oxide (MAG-OX) 400 (240 Mg) MG tablet Take 1 tablet (400 mg total) by mouth 2 (two) times daily. 60 tablet 3   metFORMIN (GLUCOPHAGE) 1000 MG tablet Take 1,000 mg by mouth daily.     Misc. Devices MISC Please provide patient with 3 diabetic nutritional supplements per day. 1 each 11   MYRBETRIQ 50 MG TB24 tablet Take 50 mg by mouth daily.     PACLITAXEL IV Inject into the vein every 21 ( twenty-one) days.     pantoprazole (PROTONIX) 20 MG tablet Take 20 mg by mouth daily.     predniSONE (DELTASONE) 50 MG tablet Take 13hrs, 7hrs and 1hr before port placement 3 tablet 0   Tetrahydrozoline HCl (REDNESS RELIEVER EYE DROPS OP) Place 1 drop into both eyes daily as needed (redness).     traMADol (ULTRAM) 50 MG tablet Take 2 tablets (100 mg total) by mouth every 6 (six) hours as needed. 240 tablet 0   lidocaine-prilocaine (EMLA) cream Apply a small amount to port a cath site and cover with plastic wrap 1 hour prior to chemotherapy appointments (Patient not taking: Reported on 04/16/2021) 30 g 3   prochlorperazine (COMPAZINE) 10 MG tablet Take 1 tablet (10 mg total) by mouth every 6 (six) hours as needed (Nausea or vomiting). (Patient not taking: Reported on  04/16/2021) 30 tablet 1   No current facility-administered medications for this visit.    ALLERGIES:  Allergies  Allergen Reactions   Aspirin Hives   Iodinated Contrast Media Itching    Rash    Mometasone Itching   Naproxen Sodium Itching   Penicillin G Swelling   Triamcinolone Itching   Codeine Palpitations   Ibuprofen Rash    PHYSICAL EXAM:  Performance status (ECOG): 1 - Symptomatic but completely ambulatory  There were no vitals filed for this visit. Wt Readings from Last 3 Encounters:  04/16/21 191 lb 12.8 oz (87 kg)  03/26/21 194 lb 3.2 oz (88.1 kg)  03/05/21 183 lb 12.8 oz (83.4 kg)   Physical Exam Vitals reviewed.  Constitutional:      Appearance: Normal appearance. She is obese.  Cardiovascular:     Rate and Rhythm: Normal rate and regular rhythm.     Pulses: Normal pulses.     Heart sounds: Normal heart sounds.  Pulmonary:     Effort: Pulmonary effort is normal.     Breath sounds: Normal breath sounds.  Abdominal:     Palpations: Abdomen is soft. There is no mass.     Tenderness: There is no abdominal tenderness.  Musculoskeletal:     Right lower leg: No edema.     Left lower leg: No edema.  Neurological:     General: No focal deficit present.     Mental Status: She is alert and oriented to person, place, and time.  Psychiatric:        Mood and Affect: Mood normal.        Behavior: Behavior normal.    LABORATORY DATA:  I have reviewed the labs as listed.  CBC Latest Ref Rng & Units 04/16/2021 03/26/2021 03/05/2021  WBC 4.0 - 10.5 K/uL 5.1 7.9 7.4  Hemoglobin 12.0 - 15.0 g/dL 9.4(L) 9.6(L) 10.2(L)  Hematocrit 36.0 - 46.0 % 27.3(L) 28.7(L) 29.7(L)  Platelets 150 - 400 K/uL 233 189 220   CMP Latest Ref Rng & Units  03/26/2021 03/05/2021 02/21/2021  Glucose 70 - 99 mg/dL 165(H) 157(H) 155(H)  BUN 8 - 23 mg/dL 18 30(H) 14  Creatinine 0.44 - 1.00 mg/dL 0.89 0.87 0.83  Sodium 135 - 145 mmol/L 135 138 133(L)  Potassium 3.5 - 5.1 mmol/L 4.4 4.8 4.6  Chloride  98 - 111 mmol/L 105 104 101  CO2 22 - 32 mmol/L 26 25 24   Calcium 8.9 - 10.3 mg/dL 8.7(L) 9.7 9.4  Total Protein 6.5 - 8.1 g/dL 6.9 7.4 7.8  Total Bilirubin 0.3 - 1.2 mg/dL 0.1(L) 0.2(L) 0.3  Alkaline Phos 38 - 126 U/L 93 83 93  AST 15 - 41 U/L 14(L) 14(L) 16  ALT 0 - 44 U/L 14 16 21     DIAGNOSTIC IMAGING:  I have independently reviewed the scans and discussed with the patient. No results found.   ASSESSMENT:  Stage IIIb high-grade serous ovarian carcinoma: - Presentation with abdominal distention and bloating sensation. - CT abdomen at outside hospital on 12/05/2020 with findings concerning for ovarian neoplasm. - CT chest without contrast on 12/18/2020 with scattered solid pulmonary nodules measuring up to 3 mm, indeterminate. - CA125 on 12/05/2020-10000 - 12/18/2020 exploratory laparotomy, bilateral salpingo-oophorectomy, infra gastric omentectomy, R0 primary tumor debulking by Dr. Denman George at [facility]. - Pathology-left ovary high-grade serous carcinoma, greatest dimension 26.5 cm, left ovarian capsule intact.  Fallopian tube surface involvement present, omentum involvement present, largest extrapelvic peritoneal focus-macroscopic, peritoneal/ascitic fluid involvement negative.  PT3BPNX.  FIGO stage IIIb. - She was evaluated for FLORA 5 clinical trial, found not to be a candidate. - Germline mutation testing was negative.     Social/family history: - She is seen with her daughter-in-law today.  She lives by herself.  She ambulates with the help of cane.  She retired after working at [employer].  She is non-smoker. - Brother had prostate cancer.  Another brother had throat cancer.  Sister had kidney cancer.  Grandson had bladder cancer at age 38.  Paternal uncle had head and neck cancer.   PLAN:  Stage IIIb high-grade serous ovarian carcinoma: - She has tolerated cycle 3 reasonably well. - She started taking Lasix 20 mg daily as needed.  She lost about 3 pounds.  She also reported  improvement in her abdomen tightness. - She has some nausea but denies any vomiting.  Tingling or numbness is stable. - Reviewed labs today which showed normal LFTs.  Creatinine was also normal.  CBC shows mild anemia which is stable. - She will proceed with cycle 4 without any dose modifications.  RTC 3 weeks for follow-up.  2.  Hypomagnesemia: - Continue magnesium 400 mg twice daily.  Magnesium today is normal.  3.  Type 2 diabetes: - Continue metformin, Januvia and Tresiba.  4.  Peripheral neuropathy: - Continue gabapentin 300 mg twice daily.  5.  Taxol induced leg pains: - Paclitaxel dose is reduced by 20%. - She reports pain in her legs which lasted about 2 days after each cycle. - Hydrocodone is not helping much with the pains.  Continue Tylenol as needed which helps.   Orders placed this encounter:  No orders of the defined types were placed in this encounter.    Derek Jack, MD [place] [phone]   I, Thana Ates, am acting as a scribe for Dr. Derek Jack.  I, Derek Jack MD, have reviewed the above documentation for accuracy and completeness, and I agree with the above.

## 2021-04-16 NOTE — Patient Instructions (Signed)
[place] at [facility] Discharge Instructions  You were seen today by Dr. Delton Coombes. He went over your recent results, and you received your treatment. Dr. Delton Coombes will see you back in as scheduled for labs and follow up.   Thank you for choosing [facility] to provide your oncology and hematology care.  To afford each patient quality time with our provider, please arrive at least 15 minutes before your scheduled appointment time.   If you have a lab appointment with the [facility] please come in thru the Main Entrance and check in at the main information desk  You need to re-schedule your appointment should you arrive 10 or more minutes late.  We strive to give you quality time with our providers, and arriving late affects you and other patients whose appointments are after yours.  Also, if you no show three or more times for appointments you may be dismissed from the clinic at the providers discretion.     Again, thank you for choosing [facility].  Our hope is that these requests will decrease the amount of time that you wait before being seen by our physicians.       _____________________________________________________________  Should you have questions after your visit to [facility], please contact our office at [phone] between the hours of 8:00 a.m. and 4:30 p.m.  Voicemails left after 4:00 p.m. will not be returned until the following business day.  For prescription refill requests, have your pharmacy contact our office and allow 72 hours.    Cancer Center Support Programs:   > Cancer Support Group  2nd Tuesday of the month 1pm-2pm, Journey Room

## 2021-04-16 NOTE — Patient Instructions (Signed)
[facility]  Discharge Instructions: Thank you for choosing [facility] to provide your oncology and hematology care.  If you have a lab appointment with the Cancer Center, please come in thru the Main Entrance and check in at the main information desk.  Wear comfortable clothing and clothing appropriate for easy access to any Portacath or PICC line.   We strive to give you quality time with your provider. You may need to reschedule your appointment if you arrive late (15 or more minutes).  Arriving late affects you and other patients whose appointments are after yours.  Also, if you miss three or more appointments without notifying the office, you may be dismissed from the clinic at the provider's discretion.      For prescription refill requests, have your pharmacy contact our office and allow 72 hours for refills to be completed.        To help prevent nausea and vomiting after your treatment, we encourage you to take your nausea medication as directed.  BELOW ARE SYMPTOMS THAT SHOULD BE REPORTED IMMEDIATELY: *FEVER GREATER THAN 100.4 F (38 C) OR HIGHER *CHILLS OR SWEATING *NAUSEA AND VOMITING THAT IS NOT CONTROLLED WITH YOUR NAUSEA MEDICATION *UNUSUAL SHORTNESS OF BREATH *UNUSUAL BRUISING OR BLEEDING *URINARY PROBLEMS (pain or burning when urinating, or frequent urination) *BOWEL PROBLEMS (unusual diarrhea, constipation, pain near the anus) TENDERNESS IN MOUTH AND THROAT WITH OR WITHOUT PRESENCE OF ULCERS (sore throat, sores in mouth, or a toothache) UNUSUAL RASH, SWELLING OR PAIN  UNUSUAL VAGINAL DISCHARGE OR ITCHING   Items with * indicate a potential emergency and should be followed up as soon as possible or go to the Emergency Department if any problems should occur.  Please show the CHEMOTHERAPY ALERT CARD or IMMUNOTHERAPY ALERT CARD at check-in to the Emergency Department and triage nurse.  Should you have questions after your visit or need to cancel  or reschedule your appointment, please contact [facility] [phone]  and follow the prompts.  Office hours are 8:00 a.m. to 4:30 p.m. Monday - Friday. Please note that voicemails left after 4:00 p.m. may not be returned until the following business day.  We are closed weekends and major holidays. You have access to a nurse at all times for urgent questions. Please call the main number to the clinic [phone] and follow the prompts.  For any non-urgent questions, you may also contact your provider using MyChart. We now offer e-Visits for anyone 18 and older to request care online for non-urgent symptoms. For details visit [email or web address].   Also download the MyChart app! Go to the app store, search "MyChart", open the app, select [facility], and log in with your MyChart username and password.  Due to Covid, a mask is required upon entering the hospital/clinic. If you do not have a mask, one will be given to you upon arrival. For doctor visits, patients may have 1 support person aged 18 or older with them. For treatment visits, patients cannot have anyone with them due to current Covid guidelines and our immunocompromised population.  

## 2021-04-16 NOTE — Progress Notes (Signed)
Patient presents today for chemotherapy infusion.  Patient is in satisfactory condition with no new complaints voiced. Vital signs are stable.  Labs reviewed by Dr. [name] during her office visit.  All labs are within treatment parameters.  We will proceed with treatment per MD orders.   Patient tolerated treatment well with no complaints voiced.  Patient left via wheelchair in stable condition.  Vital signs stable at discharge.  Follow up as scheduled.    

## 2021-04-17 LAB — CA 125: Cancer Antigen (CA) 125: 13.6 U/mL (ref 0.0–38.1)

## 2021-04-18 ENCOUNTER — Ambulatory Visit (HOSPITAL_COMMUNITY): Payer: Medicare Other

## 2021-04-22 ENCOUNTER — Other Ambulatory Visit (HOSPITAL_COMMUNITY): Payer: Self-pay | Admitting: Hematology

## 2021-05-07 ENCOUNTER — Inpatient Hospital Stay (HOSPITAL_COMMUNITY): Payer: Medicare Other | Attending: Hematology

## 2021-05-07 ENCOUNTER — Inpatient Hospital Stay (HOSPITAL_COMMUNITY): Payer: Medicare Other

## 2021-05-07 ENCOUNTER — Other Ambulatory Visit: Payer: Self-pay

## 2021-05-07 ENCOUNTER — Inpatient Hospital Stay (HOSPITAL_BASED_OUTPATIENT_CLINIC_OR_DEPARTMENT_OTHER): Payer: Medicare Other | Admitting: Hematology

## 2021-05-07 VITALS — BP 118/51 | HR 93 | Temp 97.3°F | Resp 17 | Ht 62.5 in | Wt 195.2 lb

## 2021-05-07 VITALS — BP 158/69 | HR 103 | Temp 98.1°F | Resp 18

## 2021-05-07 DIAGNOSIS — G629 Polyneuropathy, unspecified: Secondary | ICD-10-CM | POA: Insufficient documentation

## 2021-05-07 DIAGNOSIS — Z5111 Encounter for antineoplastic chemotherapy: Secondary | ICD-10-CM | POA: Insufficient documentation

## 2021-05-07 DIAGNOSIS — E119 Type 2 diabetes mellitus without complications: Secondary | ICD-10-CM | POA: Diagnosis not present

## 2021-05-07 DIAGNOSIS — C562 Malignant neoplasm of left ovary: Secondary | ICD-10-CM

## 2021-05-07 DIAGNOSIS — Z79899 Other long term (current) drug therapy: Secondary | ICD-10-CM | POA: Insufficient documentation

## 2021-05-07 DIAGNOSIS — Z95828 Presence of other vascular implants and grafts: Secondary | ICD-10-CM

## 2021-05-07 DIAGNOSIS — R079 Chest pain, unspecified: Secondary | ICD-10-CM

## 2021-05-07 LAB — COMPREHENSIVE METABOLIC PANEL
ALT: 14 U/L (ref 0–44)
AST: 14 U/L — ABNORMAL LOW (ref 15–41)
Albumin: 3.7 g/dL (ref 3.5–5.0)
Alkaline Phosphatase: 73 U/L (ref 38–126)
Anion gap: 10 (ref 5–15)
BUN: 36 mg/dL — ABNORMAL HIGH (ref 8–23)
CO2: 26 mmol/L (ref 22–32)
Calcium: 9.2 mg/dL (ref 8.9–10.3)
Chloride: 104 mmol/L (ref 98–111)
Creatinine, Ser: 1.11 mg/dL — ABNORMAL HIGH (ref 0.44–1.00)
GFR, Estimated: 52 mL/min — ABNORMAL LOW (ref >60)
Glucose, Bld: 217 mg/dL — ABNORMAL HIGH (ref 70–99)
Potassium: 4.3 mmol/L (ref 3.5–5.1)
Sodium: 140 mmol/L (ref 135–145)
Total Bilirubin: 0.4 mg/dL (ref 0.3–1.2)
Total Protein: 7.1 g/dL (ref 6.5–8.1)

## 2021-05-07 LAB — CK TOTAL AND CKMB (NOT AT ARMC)
CK, MB: 1.2 ng/mL (ref 0.5–5.0)
Relative Index: INVALID (ref 0.0–2.5)
Total CK: 60 U/L (ref 38–234)

## 2021-05-07 LAB — CBC WITH DIFFERENTIAL/PLATELET
Abs Immature Granulocytes: 0.07 10*3/uL (ref 0.00–0.07)
Basophils Absolute: 0 10*3/uL (ref 0.0–0.1)
Basophils Relative: 1 %
Eosinophils Absolute: 0 10*3/uL (ref 0.0–0.5)
Eosinophils Relative: 1 %
HCT: 26.4 % — ABNORMAL LOW (ref 36.0–46.0)
Hemoglobin: 8.8 g/dL — ABNORMAL LOW (ref 12.0–15.0)
Immature Granulocytes: 1 %
Lymphocytes Relative: 27 %
Lymphs Abs: 1.6 10*3/uL (ref 0.7–4.0)
MCH: 33.2 pg (ref 26.0–34.0)
MCHC: 33.3 g/dL (ref 30.0–36.0)
MCV: 99.6 fL (ref 80.0–100.0)
Monocytes Absolute: 0.7 10*3/uL (ref 0.1–1.0)
Monocytes Relative: 12 %
Neutro Abs: 3.4 10*3/uL (ref 1.7–7.7)
Neutrophils Relative %: 58 %
Platelets: 188 10*3/uL (ref 150–400)
RBC: 2.65 MIL/uL — ABNORMAL LOW (ref 3.87–5.11)
RDW: 15.5 % (ref 11.5–15.5)
WBC: 5.8 10*3/uL (ref 4.0–10.5)
nRBC: 0 % (ref 0.0–0.2)

## 2021-05-07 LAB — TROPONIN I (HIGH SENSITIVITY): Troponin I (High Sensitivity): 6 ng/L (ref <18)

## 2021-05-07 LAB — MAGNESIUM: Magnesium: 1.5 mg/dL — ABNORMAL LOW (ref 1.7–2.4)

## 2021-05-07 MED ORDER — HEPARIN SOD (PORK) LOCK FLUSH 100 UNIT/ML IV SOLN
500.0000 [IU] | Freq: Once | INTRAVENOUS | Status: AC | PRN
  Administered 2021-05-07: 500 [IU]

## 2021-05-07 MED ORDER — SODIUM CHLORIDE 0.9 % IV SOLN: Freq: Once | INTRAVENOUS | Status: AC

## 2021-05-07 MED ORDER — SODIUM CHLORIDE 0.9 % IV SOLN
140.0000 mg/m2 | Freq: Once | INTRAVENOUS | Status: AC
  Administered 2021-05-07: 264 mg via INTRAVENOUS
  Filled 2021-05-07: qty 44

## 2021-05-07 MED ORDER — PALONOSETRON HCL INJECTION 0.25 MG/5ML
0.2500 mg | Freq: Once | INTRAVENOUS | Status: AC
  Administered 2021-05-07: 0.25 mg via INTRAVENOUS
  Filled 2021-05-07: qty 5

## 2021-05-07 MED ORDER — MAGNESIUM SULFATE 2 GM/50ML IV SOLN
2.0000 g | Freq: Once | INTRAVENOUS | Status: AC
  Administered 2021-05-07: 2 g via INTRAVENOUS
  Filled 2021-05-07: qty 50

## 2021-05-07 MED ORDER — SODIUM CHLORIDE 0.9% FLUSH
10.0000 mL | INTRAVENOUS | Status: DC | PRN
  Administered 2021-05-07: 10 mL

## 2021-05-07 MED ORDER — SODIUM CHLORIDE 0.9 % IV SOLN
10.0000 mg | Freq: Once | INTRAVENOUS | Status: AC
  Administered 2021-05-07: 10 mg via INTRAVENOUS
  Filled 2021-05-07: qty 10

## 2021-05-07 MED ORDER — DIPHENHYDRAMINE HCL 50 MG/ML IJ SOLN
50.0000 mg | Freq: Once | INTRAMUSCULAR | Status: AC
  Administered 2021-05-07: 50 mg via INTRAVENOUS
  Filled 2021-05-07: qty 1

## 2021-05-07 MED ORDER — SODIUM CHLORIDE 0.9 % IV SOLN
413.0000 mg | Freq: Once | INTRAVENOUS | Status: AC
  Administered 2021-05-07: 410 mg via INTRAVENOUS
  Filled 2021-05-07: qty 41

## 2021-05-07 MED ORDER — ACETAMINOPHEN 325 MG PO TABS
650.0000 mg | ORAL_TABLET | Freq: Once | ORAL | Status: AC
  Administered 2021-05-07: 650 mg via ORAL
  Filled 2021-05-07: qty 2

## 2021-05-07 MED ORDER — SODIUM CHLORIDE 0.9 % IV SOLN
150.0000 mg | Freq: Once | INTRAVENOUS | Status: AC
  Administered 2021-05-07: 150 mg via INTRAVENOUS
  Filled 2021-05-07: qty 150

## 2021-05-07 MED ORDER — FAMOTIDINE IN NACL 20-0.9 MG/50ML-% IV SOLN
20.0000 mg | Freq: Once | INTRAVENOUS | Status: AC
  Administered 2021-05-07: 20 mg via INTRAVENOUS
  Filled 2021-05-07: qty 50

## 2021-05-07 NOTE — Progress Notes (Signed)
Patient presents today for treatment and follow up visit with Dr. Delton Coombes. Labs reviewed and within parameters for treatment. Magnesium 1.5, HGB 8.8 , Creatinine 1.11. Message received from A. Anderson / Dr. Delton Coombes to proceed with treatment. Orders received to give Tylenol 650 mg PO x 1 dose, 500 ml bolus of Normal Saline over an hour and draw cardiac enzymes .  ? ?Message received to infuse 2 grams Magnesium sulfate.  ? ?Treatment given today per MD orders. Tolerated infusion without adverse affects. Vital signs stable. No complaints at this time. Discharged from clinic ambulatory in stable condition. Alert and oriented x 3. F/U with [facility] as scheduled.   ? ?

## 2021-05-07 NOTE — Progress Notes (Signed)
[place], [place] [address]   CLINIC:  Medical Oncology/Hematology  PCP:  Everitt Amber, MD [address] / [place] [address] [phone]   REASON FOR VISIT:  Follow-up for ovarian cancer  PRIOR THERAPY: Exploratory laparotomy, BSO, and infra-gastric omentectomy on 12/19/2020 with Dr. Denman George  NGS Results: not done  CURRENT THERAPY: Carboplatin (AUC 6) / Paclitaxel (175) q21d x 6 cycles  BRIEF ONCOLOGIC HISTORY:  Oncology History  Ovarian cancer on left [facility])  01/07/2021 Initial Diagnosis   Ovarian cancer on left [facility])   01/30/2021 Cancer Staging   Staging form: Ovary, Fallopian Tube, and Primary Peritoneal Carcinoma, AJCC 8th Edition - Clinical stage from 01/30/2021: FIGO Stage IIIB (cT3b, cNX, cM0) - Signed by Derek Jack, MD on 01/30/2021 Histologic grade (G): G3 Histologic grading system: 4 grade system    02/11/2021 -  Chemotherapy   Patient is on Treatment Plan : OVARIAN Carboplatin (AUC 6) / Paclitaxel (175) q21d x 6 cycles       CANCER STAGING:  Cancer Staging  Ovarian cancer on left [facility]) Staging form: Ovary, Fallopian Tube, and Primary Peritoneal Carcinoma, AJCC 8th Edition - Clinical stage from 01/30/2021: FIGO Stage IIIB (cT3b, cNX, cM0) - Signed by Derek Jack, MD on 01/30/2021   INTERVAL HISTORY:  [name], a 76 y.o. female, returns for routine follow-up and consideration for next cycle of chemotherapy. Rebecca [name] was last seen on 04/16/2021.  Due for cycle #5 of Carboplatin and Taxol today.   Overall, she tells me she has been feeling pretty well. She reports left sided chest pain which is worsened with deep inspiration and movement starting about 15 minutes ago. She reports diffuse bone pains following her last treatment as well as constant numbness in her fingers and toes. She is taking tylenol prn. She admits she is not taking magnesium as she reports it constipates her. She denies  history of MI. Her appetite is good. She reports nausea and vomiting following treatment. She continues to take Gabapentin, and she denies any associated fatigue.   Overall, she feels ready for next cycle of chemo today.   REVIEW OF SYSTEMS:  Review of Systems  Constitutional:  Negative for appetite change and fatigue.  Respiratory:  Positive for shortness of breath.   Cardiovascular:  Positive for chest pain (L side).  Gastrointestinal:  Positive for constipation.  Musculoskeletal:  Positive for arthralgias.  Neurological:  Positive for dizziness and numbness (fingers and toes).  Psychiatric/Behavioral:  Positive for sleep disturbance.   All other systems reviewed and are negative.  PAST MEDICAL/SURGICAL HISTORY:  Past Medical History:  Diagnosis Date   Anemia    CAD (coronary artery disease)    DM2 (diabetes mellitus, type 2) (HCC)    GERD (gastroesophageal reflux disease)    HTN (hypertension)    Ovarian ca ([place])    Port-A-Cath in place 02/02/2021   Past Surgical History:  Procedure Laterality Date   IR IMAGING GUIDED PORT INSERTION  02/08/2021    SOCIAL HISTORY:  Social History   Socioeconomic History   Marital status: Single    Spouse name: Not on file   Number of children: Not on file   Years of education: Not on file   Highest education level: Not on file  Occupational History   Not on file  Tobacco Use   Smoking status: Not on file   Smokeless tobacco: Not on file  Substance and Sexual Activity   Alcohol use: Not  on file   Drug use: Not on file   Sexual activity: Not on file  Other Topics Concern   Not on file  Social History Narrative   Not on file   Social Determinants of Health   Financial Resource Strain: Low Risk    Difficulty of Paying Living Expenses: Not hard at all  Food Insecurity: No Food Insecurity   Worried About Running Out of Food in the Last Year: Never true   Ran Out of Food in the Last Year: Never true  Transportation Needs: Unmet  Transportation Needs   Lack of Transportation (Medical): Yes   Lack of Transportation (Non-Medical): No  Physical Activity: Not on file  Stress: Stress Concern Present   Feeling of Stress : To some extent  Social Connections: Moderately Isolated   Frequency of Communication with Friends and Family: More than three times a week   Frequency of Social Gatherings with Friends and Family: More than three times a week   Attends Religious Services: More than 4 times per year   Active Member of [employer] or Organizations: No   Attends Archivist Meetings: Never   Marital Status: Widowed  Intimate Partner Violence: Not on file    FAMILY HISTORY:  No family history on file.  CURRENT MEDICATIONS:  Current Outpatient Medications  Medication Sig Dispense Refill   acetaminophen (TYLENOL) 325 MG tablet Take 3 tablets (975 mg total) by mouth every 6 (six) hours as needed for moderate pain. 360 tablet 2   atorvastatin (LIPITOR) 40 MG tablet Take 40 mg by mouth at bedtime.     azelastine (ASTELIN) 0.1 % nasal spray Place 1 spray into both nostrils 2 (two) times daily as needed for allergies.     B-D ULTRAFINE III SHORT PEN 31G X 8 MM MISC SMARTSIG:1 Each SUB-Q Daily     baclofen (LIORESAL) 10 MG tablet Take 10 mg by mouth 3 (three) times daily as needed for muscle spasms.     CARBOPLATIN IV Inject into the vein every 21 ( twenty-one) days.     ciprofloxacin (CIPRO) 500 MG tablet Take 1 tablet (500 mg total) by mouth 2 (two) times daily. 10 tablet 0   clopidogrel (PLAVIX) 75 MG tablet Take 75 mg by mouth daily.     diphenhydrAMINE (BENADRYL) 25 MG tablet Take 50 mg by mouth daily as needed for allergies or itching.     diphenhydrAMINE-zinc acetate (BENADRYL) cream Apply 1 application topically 3 (three) times daily as needed for itching.     docusate sodium (COLACE) 100 MG capsule Take 1 capsule (100 mg total) by mouth daily as needed for mild constipation. 30 capsule 1   FARXIGA 10 MG TABS  tablet Take 10 mg by mouth daily.     ferrous sulfate 325 (65 FE) MG tablet Take 325 mg by mouth every evening.     furosemide (LASIX) 20 MG tablet Take 1 tablet (20 mg total) by mouth in the morning. 30 tablet 2   gabapentin (NEURONTIN) 300 MG capsule Take 1 capsule (300 mg total) by mouth 2 (two) times daily. 60 capsule 2   HYDROcodone-acetaminophen (NORCO/VICODIN) 5-325 MG tablet Take 1 tablet by mouth every 6 (six) hours as needed for moderate pain. 60 tablet 0   insulin degludec (TRESIBA FLEXTOUCH) 100 UNIT/ML FlexTouch Pen Inject 19 Units into the skin at bedtime.     JANUVIA 100 MG tablet Take 100 mg by mouth daily.     lidocaine-prilocaine (EMLA) cream Apply a small  amount to port a cath site and cover with plastic wrap 1 hour prior to chemotherapy appointments 30 g 3   losartan (COZAAR) 50 MG tablet Take 50 mg by mouth at bedtime.     magnesium oxide (MAG-OX) 400 (240 Mg) MG tablet TAKE 1 TABLET BY MOUTH TWICE A DAY 180 tablet 1   metFORMIN (GLUCOPHAGE) 1000 MG tablet Take 1,000 mg by mouth daily.     Misc. Devices MISC Please provide patient with 3 diabetic nutritional supplements per day. 1 each 11   MYRBETRIQ 50 MG TB24 tablet Take 50 mg by mouth daily.     PACLITAXEL IV Inject into the vein every 21 ( twenty-one) days.     pantoprazole (PROTONIX) 20 MG tablet Take 20 mg by mouth daily.     predniSONE (DELTASONE) 50 MG tablet Take 13hrs, 7hrs and 1hr before port placement 3 tablet 0   prochlorperazine (COMPAZINE) 10 MG tablet Take 1 tablet (10 mg total) by mouth every 6 (six) hours as needed (Nausea or vomiting). 30 tablet 1   Tetrahydrozoline HCl (REDNESS RELIEVER EYE DROPS OP) Place 1 drop into both eyes daily as needed (redness).     traMADol (ULTRAM) 50 MG tablet Take 2 tablets (100 mg total) by mouth every 6 (six) hours as needed. 240 tablet 0   No current facility-administered medications for this visit.    ALLERGIES:  Allergies  Allergen Reactions   Aspirin Hives    Iodinated Contrast Media Itching    Rash    Mometasone Itching   Naproxen Sodium Itching   Penicillin G Swelling   Triamcinolone Itching   Codeine Palpitations   Ibuprofen Rash    PHYSICAL EXAM:  Performance status (ECOG): 1 - Symptomatic but completely ambulatory  Vitals:   05/07/21 0807  BP: (!) 118/51  Pulse: 93  Resp: 17  Temp: (!) 97.3 F (36.3 C)  SpO2: 99%   Wt Readings from Last 3 Encounters:  05/07/21 195 lb 3.2 oz (88.5 kg)  04/16/21 191 lb 12.8 oz (87 kg)  03/26/21 194 lb 3.2 oz (88.1 kg)   Physical Exam Vitals reviewed.  Constitutional:      Appearance: Normal appearance. She is obese.  Cardiovascular:     Rate and Rhythm: Normal rate and regular rhythm.     Pulses: Normal pulses.     Heart sounds: Normal heart sounds.  Pulmonary:     Effort: Pulmonary effort is normal.     Breath sounds: Normal breath sounds.  Chest:     Chest wall: Tenderness (L side) present.  Musculoskeletal:     Right lower leg: No edema.     Left lower leg: No edema.  Neurological:     General: No focal deficit present.     Mental Status: She is alert and oriented to person, place, and time.  Psychiatric:        Mood and Affect: Mood normal.        Behavior: Behavior normal.    LABORATORY DATA:  I have reviewed the labs as listed.  CBC Latest Ref Rng & Units 05/07/2021 04/16/2021 03/26/2021  WBC 4.0 - 10.5 K/uL 5.8 5.1 7.9  Hemoglobin 12.0 - 15.0 g/dL 8.8(L) 9.4(L) 9.6(L)  Hematocrit 36.0 - 46.0 % 26.4(L) 27.3(L) 28.7(L)  Platelets 150 - 400 K/uL 188 233 189   CMP Latest Ref Rng & Units 05/07/2021 04/16/2021 03/26/2021  Glucose 70 - 99 mg/dL 217(H) 155(H) 165(H)  BUN 8 - 23 mg/dL 36(H) 25(H) 18  Creatinine  0.44 - 1.00 mg/dL 1.11(H) 0.92 0.89  Sodium 135 - 145 mmol/L 140 138 135  Potassium 3.5 - 5.1 mmol/L 4.3 4.1 4.4  Chloride 98 - 111 mmol/L 104 105 105  CO2 22 - 32 mmol/L 26 20(L) 26  Calcium 8.9 - 10.3 mg/dL 9.2 9.8 8.7(L)  Total Protein 6.5 - 8.1 g/dL 7.1 7.1 6.9   Total Bilirubin 0.3 - 1.2 mg/dL 0.4 0.4 0.1(L)  Alkaline Phos 38 - 126 U/L 73 85 93  AST 15 - 41 U/L 14(L) 11(L) 14(L)  ALT 0 - 44 U/L '14 13 14    '$ DIAGNOSTIC IMAGING:  I have independently reviewed the scans and discussed with the patient. No results found.   ASSESSMENT:  Stage IIIb high-grade serous ovarian carcinoma: - Presentation with abdominal distention and bloating sensation. - CT abdomen at outside hospital on 12/05/2020 with findings concerning for ovarian neoplasm. - CT chest without contrast on 12/18/2020 with scattered solid pulmonary nodules measuring up to 3 mm, indeterminate. - CA125 on 12/05/2020-10000 - 12/18/2020 exploratory laparotomy, bilateral salpingo-oophorectomy, infra gastric omentectomy, R0 primary tumor debulking by Dr. Denman George at [facility]. - Pathology-left ovary high-grade serous carcinoma, greatest dimension 26.5 cm, left ovarian capsule intact.  Fallopian tube surface involvement present, omentum involvement present, largest extrapelvic peritoneal focus-macroscopic, peritoneal/ascitic fluid involvement negative.  PT3BPNX.  FIGO stage IIIb. - She was evaluated for FLORA 5 clinical trial, found not to be a candidate. - Germline mutation testing was negative.     Social/family history: - She is seen with her daughter-in-law today.  She lives by herself.  She ambulates with the help of cane.  She retired after working at [employer].  She is non-smoker. - Brother had prostate cancer.  Another brother had throat cancer.  Sister had kidney cancer.  Grandson had bladder cancer at age 22.  Paternal uncle had head and neck cancer.   PLAN:  Stage IIIb high-grade serous ovarian carcinoma: - She reported some left chest wall pain on movements and taking deep breath.  This started about 15 minutes ago today.  However she had similar episode 1 week ago which improved with Tylenol. - We have checked cardiac enzymes which were negative. - Reviewed labs today which showed  creatinine increased to 1.11.  She will receive 100 mL of normal saline today. - White count and platelet count was normal.  Normocytic anemia from myelosuppression with hemoglobin 8.8.  LFTs are normal. - Proceed with cycle 5 today with dose reduction of paclitaxel. - RTC 3 weeks for follow-up.  2.  Hypomagnesemia: - Continue magnesium 400 mg twice daily.  Magnesium is 1.5.  3.  Type 2 diabetes: - Continue metformin, Januvia and Tresiba.  4.  Peripheral neuropathy: - She reports some neuropathic pains. - Will increase gabapentin to 300 mg 3 times daily.  5.  Taxol induced leg pains: - She has leg pains lasting about 2 days after each cycle. - We have dose reduced paclitaxel by 20%. - Continue Tylenol as needed.   Orders placed this encounter:  Orders Placed This Encounter  Procedures   CK total and CKMB (cardiac)not at [place], MD [place] [phone]   I, Thana Ates, am acting as a scribe for Dr. Derek Jack.  I, Derek Jack MD, have reviewed the above documentation for accuracy and completeness, and I agree with the above.

## 2021-05-07 NOTE — Patient Instructions (Signed)
[facility] at [facility] Discharge Instructions   You were seen and examined today by Dr. [name].  He reviewed the results of your lab work which is normal/stable.   We will proceed with your treatment today.  Return as scheduled.    Thank you for choosing [facility] at [facility] to provide your oncology and hematology care.  To afford each patient quality time with our provider, please arrive at least 15 minutes before your scheduled appointment time.   If you have a lab appointment with the Cancer Center please come in thru the Main Entrance and check in at the main information desk.  You need to re-schedule your appointment should you arrive 10 or more minutes late.  We strive to give you quality time with our providers, and arriving late affects you and other patients whose appointments are after yours.  Also, if you no show three or more times for appointments you may be dismissed from the clinic at the providers discretion.     Again, thank you for choosing [facility].  Our hope is that these requests will decrease the amount of time that you wait before being seen by our physicians.       _____________________________________________________________  Should you have questions after your visit to  Cancer Center, please contact our office at [phone] and follow the prompts.  Our office hours are 8:00 a.m. and 4:30 p.m. Monday - Friday.  Please note that voicemails left after 4:00 p.m. may not be returned until the following business day.  We are closed weekends and major holidays.  You do have access to a nurse 24-7, just call the main number to the clinic [phone] and do not press any options, hold on the line and a nurse will answer the phone.    For prescription refill requests, have your pharmacy contact our office and allow 72 hours.    Due to Covid, you will need to wear a mask upon entering the  hospital. If you do not have a mask, a mask will be given to you at the Main Entrance upon arrival. For doctor visits, patients may have 1 support person age 18 or older with them. For treatment visits, patients can not have anyone with them due to social distancing guidelines and our immunocompromised population.      

## 2021-05-07 NOTE — Progress Notes (Signed)
Patient has been examined by Dr. [name], and vital signs and labs have been reviewed. ANC, Creatinine, LFTs, hemoglobin, and platelets are within treatment parameters per M.D. - pt may proceed with treatment.    °

## 2021-05-07 NOTE — Patient Instructions (Signed)
[facility]  Discharge Instructions: Thank you for choosing [facility] to provide your oncology and hematology care.  If you have a lab appointment with the Cancer Center, please come in thru the Main Entrance and check in at the main information desk.  Wear comfortable clothing and clothing appropriate for easy access to any Portacath or PICC line.   We strive to give you quality time with your provider. You may need to reschedule your appointment if you arrive late (15 or more minutes).  Arriving late affects you and other patients whose appointments are after yours.  Also, if you miss three or more appointments without notifying the office, you may be dismissed from the clinic at the provider's discretion.      For prescription refill requests, have your pharmacy contact our office and allow 72 hours for refills to be completed.    Today you received the following chemotherapy and/or immunotherapy agents Taxol and Carboplatin.   To help prevent nausea and vomiting after your treatment, we encourage you to take your nausea medication as directed.  BELOW ARE SYMPTOMS THAT SHOULD BE REPORTED IMMEDIATELY: . *FEVER GREATER THAN 100.4 F (38 C) OR HIGHER . *CHILLS OR SWEATING . *NAUSEA AND VOMITING THAT IS NOT CONTROLLED WITH YOUR NAUSEA MEDICATION . *UNUSUAL SHORTNESS OF BREATH . *UNUSUAL BRUISING OR BLEEDING . *URINARY PROBLEMS (pain or burning when urinating, or frequent urination) . *BOWEL PROBLEMS (unusual diarrhea, constipation, pain near the anus) . TENDERNESS IN MOUTH AND THROAT WITH OR WITHOUT PRESENCE OF ULCERS (sore throat, sores in mouth, or a toothache) . UNUSUAL RASH, SWELLING OR PAIN  . UNUSUAL VAGINAL DISCHARGE OR ITCHING   Items with * indicate a potential emergency and should be followed up as soon as possible or go to the Emergency Department if any problems should occur.  Please show the CHEMOTHERAPY ALERT CARD or IMMUNOTHERAPY ALERT CARD at  check-in to the Emergency Department and triage nurse.  Should you have questions after your visit or need to cancel or reschedule your appointment, please contact [facility] [phone]  and follow the prompts.  Office hours are 8:00 a.m. to 4:30 p.m. Monday - Friday. Please note that voicemails left after 4:00 p.m. may not be returned until the following business day.  We are closed weekends and major holidays. You have access to a nurse at all times for urgent questions. Please call the main number to the clinic [phone] and follow the prompts.  For any non-urgent questions, you may also contact your provider using MyChart. We now offer e-Visits for anyone 18 and older to request care online for non-urgent symptoms. For details visit [email or web address].   Also download the MyChart app! Go to the app store, search "MyChart", open the app, select [place], and log in with your MyChart username and password.  Due to Covid, a mask is required upon entering the hospital/clinic. If you do not have a mask, one will be given to you upon arrival. For doctor visits, patients may have 1 support person aged 18 or older with them. For treatment visits, patients cannot have anyone with them due to current Covid guidelines and our immunocompromised population.  

## 2021-05-08 LAB — CA 125: Cancer Antigen (CA) 125: 12.5 U/mL (ref 0.0–38.1)

## 2021-05-09 ENCOUNTER — Ambulatory Visit (HOSPITAL_COMMUNITY): Payer: Medicare Other

## 2021-05-28 ENCOUNTER — Other Ambulatory Visit (HOSPITAL_COMMUNITY): Payer: Self-pay | Admitting: *Deleted

## 2021-05-28 ENCOUNTER — Inpatient Hospital Stay (HOSPITAL_COMMUNITY): Payer: Medicare Other

## 2021-05-28 ENCOUNTER — Other Ambulatory Visit: Payer: Self-pay

## 2021-05-28 ENCOUNTER — Inpatient Hospital Stay (HOSPITAL_BASED_OUTPATIENT_CLINIC_OR_DEPARTMENT_OTHER): Payer: Medicare Other | Admitting: Hematology

## 2021-05-28 VITALS — BP 158/84 | HR 108 | Temp 98.2°F | Resp 18

## 2021-05-28 VITALS — BP 159/76 | HR 99 | Temp 98.4°F | Resp 20 | Ht 62.5 in | Wt 197.1 lb

## 2021-05-28 DIAGNOSIS — C562 Malignant neoplasm of left ovary: Secondary | ICD-10-CM

## 2021-05-28 DIAGNOSIS — Z5111 Encounter for antineoplastic chemotherapy: Secondary | ICD-10-CM | POA: Diagnosis not present

## 2021-05-28 DIAGNOSIS — Z95828 Presence of other vascular implants and grafts: Secondary | ICD-10-CM

## 2021-05-28 LAB — MAGNESIUM: Magnesium: 1.6 mg/dL — ABNORMAL LOW (ref 1.7–2.4)

## 2021-05-28 LAB — CBC WITH DIFFERENTIAL/PLATELET
Abs Immature Granulocytes: 0.04 10*3/uL (ref 0.00–0.07)
Basophils Absolute: 0 10*3/uL (ref 0.0–0.1)
Basophils Relative: 0 %
Eosinophils Absolute: 0 10*3/uL (ref 0.0–0.5)
Eosinophils Relative: 1 %
HCT: 27.9 % — ABNORMAL LOW (ref 36.0–46.0)
Hemoglobin: 9.4 g/dL — ABNORMAL LOW (ref 12.0–15.0)
Immature Granulocytes: 1 %
Lymphocytes Relative: 30 %
Lymphs Abs: 1.3 10*3/uL (ref 0.7–4.0)
MCH: 33.3 pg (ref 26.0–34.0)
MCHC: 33.7 g/dL (ref 30.0–36.0)
MCV: 98.9 fL (ref 80.0–100.0)
Monocytes Absolute: 0.6 10*3/uL (ref 0.1–1.0)
Monocytes Relative: 13 %
Neutro Abs: 2.4 10*3/uL (ref 1.7–7.7)
Neutrophils Relative %: 55 %
Platelets: 178 10*3/uL (ref 150–400)
RBC: 2.82 MIL/uL — ABNORMAL LOW (ref 3.87–5.11)
RDW: 14.9 % (ref 11.5–15.5)
WBC: 4.5 10*3/uL (ref 4.0–10.5)
nRBC: 0 % (ref 0.0–0.2)

## 2021-05-28 LAB — COMPREHENSIVE METABOLIC PANEL
ALT: 15 U/L (ref 0–44)
AST: 16 U/L (ref 15–41)
Albumin: 3.9 g/dL (ref 3.5–5.0)
Alkaline Phosphatase: 85 U/L (ref 38–126)
Anion gap: 8 (ref 5–15)
BUN: 28 mg/dL — ABNORMAL HIGH (ref 8–23)
CO2: 23 mmol/L (ref 22–32)
Calcium: 9.3 mg/dL (ref 8.9–10.3)
Chloride: 108 mmol/L (ref 98–111)
Creatinine, Ser: 1.23 mg/dL — ABNORMAL HIGH (ref 0.44–1.00)
GFR, Estimated: 46 mL/min — ABNORMAL LOW (ref >60)
Glucose, Bld: 204 mg/dL — ABNORMAL HIGH (ref 70–99)
Potassium: 4.3 mmol/L (ref 3.5–5.1)
Sodium: 139 mmol/L (ref 135–145)
Total Bilirubin: 0.2 mg/dL — ABNORMAL LOW (ref 0.3–1.2)
Total Protein: 7.2 g/dL (ref 6.5–8.1)

## 2021-05-28 MED ORDER — SODIUM CHLORIDE 0.9 % IV SOLN: Freq: Once | INTRAVENOUS | Status: AC

## 2021-05-28 MED ORDER — MAGNESIUM SULFATE 2 GM/50ML IV SOLN
2.0000 g | Freq: Once | INTRAVENOUS | Status: AC
  Administered 2021-05-28: 2 g via INTRAVENOUS
  Filled 2021-05-28: qty 50

## 2021-05-28 MED ORDER — FAMOTIDINE IN NACL 20-0.9 MG/50ML-% IV SOLN
20.0000 mg | Freq: Once | INTRAVENOUS | Status: AC
  Administered 2021-05-28: 20 mg via INTRAVENOUS
  Filled 2021-05-28: qty 50

## 2021-05-28 MED ORDER — PREDNISONE 50 MG PO TABS: ORAL_TABLET | ORAL | 0 refills | Status: DC

## 2021-05-28 MED ORDER — PALONOSETRON HCL INJECTION 0.25 MG/5ML
0.2500 mg | Freq: Once | INTRAVENOUS | Status: AC
  Administered 2021-05-28: 0.25 mg via INTRAVENOUS
  Filled 2021-05-28: qty 5

## 2021-05-28 MED ORDER — SODIUM CHLORIDE 0.9 % IV SOLN
100.0000 mg/m2 | Freq: Once | INTRAVENOUS | Status: AC
  Administered 2021-05-28: 186 mg via INTRAVENOUS
  Filled 2021-05-28: qty 31

## 2021-05-28 MED ORDER — HEPARIN SOD (PORK) LOCK FLUSH 100 UNIT/ML IV SOLN
500.0000 [IU] | Freq: Once | INTRAVENOUS | Status: AC | PRN
  Administered 2021-05-28: 500 [IU]

## 2021-05-28 MED ORDER — SODIUM CHLORIDE 0.9 % IV SOLN
150.0000 mg | Freq: Once | INTRAVENOUS | Status: AC
  Administered 2021-05-28: 150 mg via INTRAVENOUS
  Filled 2021-05-28: qty 150

## 2021-05-28 MED ORDER — SODIUM CHLORIDE 0.9 % IV SOLN
384.5000 mg | Freq: Once | INTRAVENOUS | Status: AC
  Administered 2021-05-28: 380 mg via INTRAVENOUS
  Filled 2021-05-28: qty 38

## 2021-05-28 MED ORDER — SODIUM CHLORIDE 0.9 % IV SOLN
10.0000 mg | Freq: Once | INTRAVENOUS | Status: AC
  Administered 2021-05-28: 10 mg via INTRAVENOUS
  Filled 2021-05-28: qty 10

## 2021-05-28 MED ORDER — DIPHENHYDRAMINE HCL 50 MG/ML IJ SOLN
50.0000 mg | Freq: Once | INTRAMUSCULAR | Status: AC
  Administered 2021-05-28: 50 mg via INTRAVENOUS
  Filled 2021-05-28: qty 1

## 2021-05-28 MED ORDER — POTASSIUM CHLORIDE IN NACL 20-0.9 MEQ/L-% IV SOLN
Freq: Once | INTRAVENOUS | Status: AC
  Filled 2021-05-28: qty 1000

## 2021-05-28 MED ORDER — SODIUM CHLORIDE 0.9% FLUSH
10.0000 mL | INTRAVENOUS | Status: DC | PRN
  Administered 2021-05-28: 10 mL

## 2021-05-28 NOTE — Patient Instructions (Signed)
[place] at [facility] ?Discharge Instructions ? ? ?You were seen and examined today by Dr. Delton Coombes. ? ?He reviewed the results of your lab work which are normal/stable. ? ?We will proceed with your last treatment today. ? ?We will repeat at CT scan in 1 month.   ? ?Return as scheduled after CT.  ? ? ?Thank you for choosing [place] at [facility] to provide your oncology and hematology care.  To afford each patient quality time with our provider, please arrive at least 15 minutes before your scheduled appointment time.  ? ?If you have a lab appointment with the [facility] please come in thru the Main Entrance and check in at the main information desk. ? ?You need to re-schedule your appointment should you arrive 10 or more minutes late.  We strive to give you quality time with our providers, and arriving late affects you and other patients whose appointments are after yours.  Also, if you no show three or more times for appointments you may be dismissed from the clinic at the providers discretion.     ?Again, thank you for choosing [facility].  Our hope is that these requests will decrease the amount of time that you wait before being seen by our physicians.       ?_____________________________________________________________ ? ?Should you have questions after your visit to [facility], please contact our office at [phone] and follow the prompts.  Our office hours are 8:00 a.m. and 4:30 p.m. Monday - Friday.  Please note that voicemails left after 4:00 p.m. may not be returned until the following business day.  We are closed weekends and major holidays.  You do have access to a nurse 24-7, just call the main number to the clinic [phone] and do not press any options, hold on the line and a nurse will answer the phone.   ? ?For prescription refill requests, have your pharmacy contact our office and allow 72 hours.   ? ?Due  to Covid, you will need to wear a mask upon entering the hospital. If you do not have a mask, a mask will be given to you at the Main Entrance upon arrival. For doctor visits, patients may have 1 support person age 7 or older with them. For treatment visits, patients can not have anyone with them due to social distancing guidelines and our immunocompromised population.  ? ?   ?

## 2021-05-28 NOTE — Progress Notes (Signed)
Patient has been examined by Dr. [name], and vital signs and labs have been reviewed. ANC, Creatinine, LFTs, hemoglobin, and platelets are within treatment parameters per M.D. - pt may proceed with treatment.    °

## 2021-05-28 NOTE — Patient Instructions (Signed)
[facility]  Discharge Instructions: Thank you for choosing [facility] to provide your oncology and hematology care.  If you have a lab appointment with the Cancer Center, please come in thru the Main Entrance and check in at the main information desk.  Wear comfortable clothing and clothing appropriate for easy access to any Portacath or PICC line.   We strive to give you quality time with your provider. You may need to reschedule your appointment if you arrive late (15 or more minutes).  Arriving late affects you and other patients whose appointments are after yours.  Also, if you miss three or more appointments without notifying the office, you may be dismissed from the clinic at the provider's discretion.      For prescription refill requests, have your pharmacy contact our office and allow 72 hours for refills to be completed.    Today you received the following chemotherapy and/or immunotherapy agents Taxol/Carboplatin.       To help prevent nausea and vomiting after your treatment, we encourage you to take your nausea medication as directed.  BELOW ARE SYMPTOMS THAT SHOULD BE REPORTED IMMEDIATELY: *FEVER GREATER THAN 100.4 F (38 C) OR HIGHER *CHILLS OR SWEATING *NAUSEA AND VOMITING THAT IS NOT CONTROLLED WITH YOUR NAUSEA MEDICATION *UNUSUAL SHORTNESS OF BREATH *UNUSUAL BRUISING OR BLEEDING *URINARY PROBLEMS (pain or burning when urinating, or frequent urination) *BOWEL PROBLEMS (unusual diarrhea, constipation, pain near the anus) TENDERNESS IN MOUTH AND THROAT WITH OR WITHOUT PRESENCE OF ULCERS (sore throat, sores in mouth, or a toothache) UNUSUAL RASH, SWELLING OR PAIN  UNUSUAL VAGINAL DISCHARGE OR ITCHING   Items with * indicate a potential emergency and should be followed up as soon as possible or go to the Emergency Department if any problems should occur.  Please show the CHEMOTHERAPY ALERT CARD or IMMUNOTHERAPY ALERT CARD at check-in to the  Emergency Department and triage nurse.  Should you have questions after your visit or need to cancel or reschedule your appointment, please contact [facility] [phone]  and follow the prompts.  Office hours are 8:00 a.m. to 4:30 p.m. Monday - Friday. Please note that voicemails left after 4:00 p.m. may not be returned until the following business day.  We are closed weekends and major holidays. You have access to a nurse at all times for urgent questions. Please call the main number to the clinic [phone] and follow the prompts.  For any non-urgent questions, you may also contact your provider using MyChart. We now offer e-Visits for anyone 18 and older to request care online for non-urgent symptoms. For details visit [email or web address].   Also download the MyChart app! Go to the app store, search "MyChart", open the app, select [place], and log in with your MyChart username and password.  Due to Covid, a mask is required upon entering the hospital/clinic. If you do not have a mask, one will be given to you upon arrival. For doctor visits, patients may have 1 support person aged 18 or older with them. For treatment visits, patients cannot have anyone with them due to current Covid guidelines and our immunocompromised population.  

## 2021-05-28 NOTE — Progress Notes (Signed)
? ?[facility] ?[address] ?[place], [place] [address] ? ? ?CLINIC:  ?Medical Oncology/Hematology ? ?PCP:  ?Everitt Amber, MD ?[address] / [place] [address] ?[phone] ? ? ?REASON FOR VISIT:  ?Follow-up for ovarian cancer ? ?PRIOR THERAPY: Exploratory laparotomy, BSO, and infra-gastric omentectomy on 12/19/2020 with Dr. Denman George ? ?NGS Results: not done ? ?CURRENT THERAPY: Carboplatin (AUC 6) / Paclitaxel (175) q21d x 6 cycles ? ?BRIEF ONCOLOGIC HISTORY:  ?Oncology History  ?Ovarian cancer on left [facility])  ?01/07/2021 Initial Diagnosis  ? Ovarian cancer on left [facility]) ?  ?01/30/2021 Cancer Staging  ? Staging form: Ovary, Fallopian Tube, and Primary Peritoneal Carcinoma, AJCC 8th Edition ?- Clinical stage from 01/30/2021: FIGO Stage IIIB (cT3b, cNX, cM0) - Signed by Derek Jack, MD on 01/30/2021 ?Histologic grade (G): G3 ?Histologic grading system: 4 grade system ? ?  ?02/11/2021 -  Chemotherapy  ? Patient is on Treatment Plan : OVARIAN Carboplatin (AUC 6) / Paclitaxel (175) q21d x 6 cycles  ?   ? ? ?CANCER STAGING: ? Cancer Staging  ?Ovarian cancer on left [facility]) ?Staging form: Ovary, Fallopian Tube, and Primary Peritoneal Carcinoma, AJCC 8th Edition ?- Clinical stage from 01/30/2021: FIGO Stage IIIB (cT3b, cNX, cM0) - Signed by Derek Jack, MD on 01/30/2021 ? ? ?INTERVAL HISTORY:  ?[name], a 76 y.o. female, returns for routine follow-up and consideration for next cycle of chemotherapy. Rebecca [name] was last seen on 05/07/2021. ? ?Due for cycle #6 of Carboplatin and Taxol  today.  ? ?Overall, she tells me she has been feeling pretty well. She continues to have numbness in her hands and feet which she reports is causing her occasionally drop objects; she also reports pain which is helped by Gabapentin. She reports the numbness is stable. She reports diffuse pains for 2-3 days after treatment. She reports nausea and denies vomiting. She is no longer taking magnesium as she  reports it caused constipation. She is now taking a multivitamin. She drinks 64 ounces of water a day. Her appetite is good, and she denies abdominal pain.  ? ?Overall, she feels ready for next cycle of chemo today.  ? ?REVIEW OF SYSTEMS:  ?Review of Systems  ?Constitutional:  Negative for appetite change and fatigue.  ?Respiratory:  Positive for cough and shortness of breath.   ?Gastrointestinal:  Positive for vomiting. Negative for abdominal pain and nausea.  ?Musculoskeletal:  Positive for back pain (8/10).  ?Neurological:  Positive for dizziness and numbness.  ?Psychiatric/Behavioral:  Positive for sleep disturbance.   ?All other systems reviewed and are negative. ? ?PAST MEDICAL/SURGICAL HISTORY:  ?Past Medical History:  ?Diagnosis Date  ? Anemia   ? CAD (coronary artery disease)   ? DM2 (diabetes mellitus, type 2) ([place])   ? GERD (gastroesophageal reflux disease)   ? HTN (hypertension)   ? Ovarian ca [facility])   ? Port-A-Cath in place 02/02/2021  ? ?Past Surgical History:  ?Procedure Laterality Date  ? IR IMAGING GUIDED PORT INSERTION  02/08/2021  ? ? ?SOCIAL HISTORY:  ?Social History  ? ?Socioeconomic History  ? Marital status: Single  ?  Spouse name: Not on file  ? Number of children: Not on file  ? Years of education: Not on file  ? Highest education level: Not on file  ?Occupational History  ? Not on file  ?Tobacco Use  ? Smoking status: Not on file  ? Smokeless tobacco: Not on file  ?Substance and Sexual Activity  ? Alcohol use: Not  on file  ? Drug use: Not on file  ? Sexual activity: Not on file  ?Other Topics Concern  ? Not on file  ?Social History Narrative  ? Not on file  ? ?Social Determinants of Health  ? ?Financial Resource Strain: Low Risk   ? Difficulty of Paying Living Expenses: Not hard at all  ?Food Insecurity: No Food Insecurity  ? Worried About Charity fundraiser in the Last Year: Never true  ? Ran Out of Food in the Last Year: Never true  ?Transportation Needs: Unmet Transportation Needs  ? Lack  of Transportation (Medical): Yes  ? Lack of Transportation (Non-Medical): No  ?Physical Activity: Not on file  ?Stress: Stress Concern Present  ? Feeling of Stress : To some extent  ?Social Connections: Moderately Isolated  ? Frequency of Communication with Friends and Family: More than three times a week  ? Frequency of Social Gatherings with Friends and Family: More than three times a week  ? Attends Religious Services: More than 4 times per year  ? Active Member of Clubs or Organizations: No  ? Attends Archivist Meetings: Never  ? Marital Status: Widowed  ?Intimate Partner Violence: Not on file  ? ? ?FAMILY HISTORY:  ?No family history on file. ? ?CURRENT MEDICATIONS:  ?Current Outpatient Medications  ?Medication Sig Dispense Refill  ? acetaminophen (TYLENOL) 325 MG tablet Take 3 tablets (975 mg total) by mouth every 6 (six) hours as needed for moderate pain. 360 tablet 2  ? atorvastatin (LIPITOR) 40 MG tablet Take 40 mg by mouth at bedtime.    ? azelastine (ASTELIN) 0.1 % nasal spray Place 1 spray into both nostrils 2 (two) times daily as needed for allergies.    ? B-D ULTRAFINE III SHORT PEN 31G X 8 MM MISC SMARTSIG:1 Each SUB-Q Daily    ? baclofen (LIORESAL) 10 MG tablet Take 10 mg by mouth 3 (three) times daily as needed for muscle spasms.    ? CARBOPLATIN IV Inject into the vein every 21 ( twenty-one) days.    ? ciprofloxacin (CIPRO) 500 MG tablet Take 1 tablet (500 mg total) by mouth 2 (two) times daily. 10 tablet 0  ? clopidogrel (PLAVIX) 75 MG tablet Take 75 mg by mouth daily.    ? diphenhydrAMINE (BENADRYL) 25 MG tablet Take 50 mg by mouth daily as needed for allergies or itching.    ? diphenhydrAMINE-zinc acetate (BENADRYL) cream Apply 1 application topically 3 (three) times daily as needed for itching.    ? docusate sodium (COLACE) 100 MG capsule Take 1 capsule (100 mg total) by mouth daily as needed for mild constipation. 30 capsule 1  ? FARXIGA 10 MG TABS tablet Take 10 mg by mouth daily.     ? ferrous sulfate 325 (65 FE) MG tablet Take 325 mg by mouth every evening.    ? furosemide (LASIX) 20 MG tablet Take 1 tablet (20 mg total) by mouth in the morning. 30 tablet 2  ? gabapentin (NEURONTIN) 300 MG capsule Take 1 capsule (300 mg total) by mouth 2 (two) times daily. 60 capsule 2  ? HYDROcodone-acetaminophen (NORCO/VICODIN) 5-325 MG tablet Take 1 tablet by mouth every 6 (six) hours as needed for moderate pain. 60 tablet 0  ? insulin degludec (TRESIBA FLEXTOUCH) 100 UNIT/ML FlexTouch Pen Inject 19 Units into the skin at bedtime.    ? JANUVIA 100 MG tablet Take 100 mg by mouth daily.    ? losartan (COZAAR) 50 MG tablet Take  50 mg by mouth at bedtime.    ? magnesium oxide (MAG-OX) 400 (240 Mg) MG tablet TAKE 1 TABLET BY MOUTH TWICE A DAY 180 tablet 1  ? metFORMIN (GLUCOPHAGE) 1000 MG tablet Take 1,000 mg by mouth daily.    ? Misc. Devices MISC Please provide patient with 3 diabetic nutritional supplements per day. 1 each 11  ? MYRBETRIQ 50 MG TB24 tablet Take 50 mg by mouth daily.    ? PACLITAXEL IV Inject into the vein every 21 ( twenty-one) days.    ? pantoprazole (PROTONIX) 20 MG tablet Take 20 mg by mouth daily.    ? predniSONE (DELTASONE) 50 MG tablet Take 13hrs, 7hrs and 1hr before port placement 3 tablet 0  ? Tetrahydrozoline HCl (REDNESS RELIEVER EYE DROPS OP) Place 1 drop into both eyes daily as needed (redness).    ? traMADol (ULTRAM) 50 MG tablet Take 2 tablets (100 mg total) by mouth every 6 (six) hours as needed. 240 tablet 0  ? lidocaine-prilocaine (EMLA) cream Apply a small amount to port a cath site and cover with plastic wrap 1 hour prior to chemotherapy appointments (Patient not taking: Reported on 05/28/2021) 30 g 3  ? prochlorperazine (COMPAZINE) 10 MG tablet Take 1 tablet (10 mg total) by mouth every 6 (six) hours as needed (Nausea or vomiting). (Patient not taking: Reported on 05/28/2021) 30 tablet 1  ? ?No current facility-administered medications for this visit.  ? ? ?ALLERGIES:   ?Allergies  ?Allergen Reactions  ? Aspirin Hives  ? Iodinated Contrast Media Itching  ?  Rash   ? Mometasone Itching  ? Naproxen Sodium Itching  ? Penicillin G Swelling  ? Triamcinolone Itching  ? Codeine Palp

## 2021-05-28 NOTE — Progress Notes (Signed)
Patient presents today for Taxol and carboplatin infusions per providers order.  Vital signs within parameters for treatment.  Labs pending.  Patient has no new complaints at this time. ? ?Labs within parameters for treatment. ? ?Patient will also receive 1 liter of house fluids per MD order with treatment. ? ?Taxol and carboplatin infusion given today per MD orders.  Stable during infusion without adverse affects.  Vital signs stable.  No complaints at this time.  Discharge from clinic ambulatory in stable condition.  Alert and oriented X 3.  Follow up with [facility] as scheduled.  ?

## 2021-05-29 LAB — CA 125: Cancer Antigen (CA) 125: 14.3 U/mL (ref 0.0–38.1)

## 2021-07-04 ENCOUNTER — Inpatient Hospital Stay (HOSPITAL_COMMUNITY): Payer: Medicare Other | Attending: Hematology

## 2021-07-04 ENCOUNTER — Other Ambulatory Visit (HOSPITAL_COMMUNITY): Payer: Self-pay | Admitting: Hematology

## 2021-07-04 ENCOUNTER — Ambulatory Visit (HOSPITAL_COMMUNITY)
Admission: RE | Admit: 2021-07-04 | Discharge: 2021-07-04 | Disposition: A | Discharge status: Home / Self Care | Payer: Medicare Other | Source: Ambulatory Visit | Attending: Hematology | Admitting: Hematology

## 2021-07-04 DIAGNOSIS — C562 Malignant neoplasm of left ovary: Secondary | ICD-10-CM

## 2021-07-04 LAB — CBC WITH DIFFERENTIAL/PLATELET
Abs Immature Granulocytes: 0.03 10*3/uL (ref 0.00–0.07)
Basophils Absolute: 0 10*3/uL (ref 0.0–0.1)
Basophils Relative: 1 %
Eosinophils Absolute: 0.1 10*3/uL (ref 0.0–0.5)
Eosinophils Relative: 2 %
HCT: 29.7 % — ABNORMAL LOW (ref 36.0–46.0)
Hemoglobin: 10 g/dL — ABNORMAL LOW (ref 12.0–15.0)
Immature Granulocytes: 1 %
Lymphocytes Relative: 32 %
Lymphs Abs: 1.5 10*3/uL (ref 0.7–4.0)
MCH: 33 pg (ref 26.0–34.0)
MCHC: 33.7 g/dL (ref 30.0–36.0)
MCV: 98 fL (ref 80.0–100.0)
Monocytes Absolute: 0.5 10*3/uL (ref 0.1–1.0)
Monocytes Relative: 10 %
Neutro Abs: 2.6 10*3/uL (ref 1.7–7.7)
Neutrophils Relative %: 54 %
Platelets: 214 10*3/uL (ref 150–400)
RBC: 3.03 MIL/uL — ABNORMAL LOW (ref 3.87–5.11)
RDW: 14.2 % (ref 11.5–15.5)
WBC: 4.8 10*3/uL (ref 4.0–10.5)
nRBC: 0 % (ref 0.0–0.2)

## 2021-07-04 LAB — COMPREHENSIVE METABOLIC PANEL
ALT: 14 U/L (ref 0–44)
AST: 14 U/L — ABNORMAL LOW (ref 15–41)
Albumin: 4.2 g/dL (ref 3.5–5.0)
Alkaline Phosphatase: 82 U/L (ref 38–126)
Anion gap: 7 (ref 5–15)
BUN: 19 mg/dL (ref 8–23)
CO2: 27 mmol/L (ref 22–32)
Calcium: 9.4 mg/dL (ref 8.9–10.3)
Chloride: 105 mmol/L (ref 98–111)
Creatinine, Ser: 1.04 mg/dL — ABNORMAL HIGH (ref 0.44–1.00)
GFR, Estimated: 56 mL/min — ABNORMAL LOW (ref >60)
Glucose, Bld: 173 mg/dL — ABNORMAL HIGH (ref 70–99)
Potassium: 3.8 mmol/L (ref 3.5–5.1)
Sodium: 139 mmol/L (ref 135–145)
Total Bilirubin: 0.6 mg/dL (ref 0.3–1.2)
Total Protein: 7.7 g/dL (ref 6.5–8.1)

## 2021-07-04 LAB — MAGNESIUM: Magnesium: 1.3 mg/dL — ABNORMAL LOW (ref 1.7–2.4)

## 2021-07-05 LAB — CA 125: Cancer Antigen (CA) 125: 13.3 U/mL (ref 0.0–38.1)

## 2021-07-15 ENCOUNTER — Telehealth (HOSPITAL_COMMUNITY): Payer: Self-pay | Admitting: Pharmacist

## 2021-07-15 ENCOUNTER — Other Ambulatory Visit (HOSPITAL_COMMUNITY): Payer: Self-pay

## 2021-07-15 ENCOUNTER — Encounter (HOSPITAL_COMMUNITY): Payer: Self-pay | Admitting: Hematology

## 2021-07-15 ENCOUNTER — Inpatient Hospital Stay (HOSPITAL_BASED_OUTPATIENT_CLINIC_OR_DEPARTMENT_OTHER): Payer: Medicare Other | Admitting: Hematology

## 2021-07-15 ENCOUNTER — Telehealth: Payer: Self-pay | Admitting: Pharmacist

## 2021-07-15 VITALS — BP 129/96 | HR 97 | Temp 98.1°F | Resp 18 | Ht 62.5 in | Wt 202.4 lb

## 2021-07-15 DIAGNOSIS — E1140 Type 2 diabetes mellitus with diabetic neuropathy, unspecified: Secondary | ICD-10-CM | POA: Insufficient documentation

## 2021-07-15 DIAGNOSIS — C562 Malignant neoplasm of left ovary: Secondary | ICD-10-CM | POA: Insufficient documentation

## 2021-07-15 DIAGNOSIS — Z794 Long term (current) use of insulin: Secondary | ICD-10-CM | POA: Insufficient documentation

## 2021-07-15 DIAGNOSIS — Z7984 Long term (current) use of oral hypoglycemic drugs: Secondary | ICD-10-CM | POA: Diagnosis not present

## 2021-07-15 DIAGNOSIS — Z9221 Personal history of antineoplastic chemotherapy: Secondary | ICD-10-CM | POA: Diagnosis not present

## 2021-07-15 DIAGNOSIS — Z79899 Other long term (current) drug therapy: Secondary | ICD-10-CM | POA: Insufficient documentation

## 2021-07-15 MED ORDER — GABAPENTIN 300 MG PO CAPS
300.0000 mg | ORAL_CAPSULE | Freq: Three times a day (TID) | ORAL | 2 refills | Status: DC
Start: 2021-07-15 — End: 2023-11-16

## 2021-07-15 MED ORDER — MAGNESIUM OXIDE -MG SUPPLEMENT 400 (240 MG) MG PO TABS: 400.0000 mg | ORAL_TABLET | Freq: Three times a day (TID) | ORAL | 1 refills | Status: DC

## 2021-07-15 MED ORDER — NIRAPARIB TOSYLATE 100 MG PO CAPS
200.0000 mg | ORAL_CAPSULE | Freq: Every day | ORAL | 0 refills | Status: DC
  Filled 2021-07-15 – 2021-07-16 (×2): qty 60, 30d supply, fill #0

## 2021-07-15 NOTE — Telephone Encounter (Signed)
Oral Oncology Pharmacist Encounter ? 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 ? ?CBC/CMP from 07/04/21 assessed, no relevant lab abnormalities. Prescription dose and frequency assessed. MD is starting patient on a reduced dose. ? ?Current medication list in Epic reviewed, no DDIs with niraparib identified. ? ?Evaluated chart and no patient barriers to medication adherence identified.  ? ?Prescription has been e-scribed to the [facility] for benefits analysis and approval. ? ?Oral Oncology Clinic will continue to follow for insurance authorization, copayment issues, initial counseling and start date. ? ? ?Darl Pikes, PharmD, BCPS, BCOP, CPP ?Hematology/Oncology Clinical Pharmacist Practitioner ?[place]/[facility] Oral Chemotherapy Navigation Clinic ?[phone] ? ?07/15/2021 2:41 PM ? ?

## 2021-07-15 NOTE — Patient Instructions (Addendum)
[place] at [facility] ?Discharge Instructions ? ? ?You were seen and examined today by Dr. Delton Coombes. ? ?He reviewed the results of your lab work and CT scan. All results are normal/stable. No evidence of cancer was seen on the CT scan.  ? ?You magnesium continues to be low. We will increase it to 1 pill three times a day.  ? ?We will start you on a pill called Zejula to help prevent the cancer from coming back. This will come from a specialty pharmacy. They will call you to set up delivery of this drug. It will come via UPS or FedEx. Expect a phone call for this.  ? ?Return as scheduled in 4 weeks. ? ? ?Thank you for choosing [place] at [facility] to provide your oncology and hematology care.  To afford each patient quality time with our provider, please arrive at least 15 minutes before your scheduled appointment time.  ? ?If you have a lab appointment with the [facility] please come in thru the Main Entrance and check in at the main information desk. ? ?You need to re-schedule your appointment should you arrive 10 or more minutes late.  We strive to give you quality time with our providers, and arriving late affects you and other patients whose appointments are after yours.  Also, if you no show three or more times for appointments you may be dismissed from the clinic at the providers discretion.     ?Again, thank you for choosing [facility].  Our hope is that these requests will decrease the amount of time that you wait before being seen by our physicians.       ?_____________________________________________________________ ? ?Should you have questions after your visit to [facility], please contact our office at [phone] and follow the prompts.  Our office hours are 8:00 a.m. and 4:30 p.m. Monday - Friday.  Please note that voicemails left after 4:00 p.m. may not be returned until the following business day.  We are  closed weekends and major holidays.  You do have access to a nurse 24-7, just call the main number to the clinic [phone] and do not press any options, hold on the line and a nurse will answer the phone.   ? ?For prescription refill requests, have your pharmacy contact our office and allow 72 hours.   ? ?Due to Covid, you will need to wear a mask upon entering the hospital. If you do not have a mask, a mask will be given to you at the Main Entrance upon arrival. For doctor visits, patients may have 1 support person age 20 or older with them. For treatment visits, patients can not have anyone with them due to social distancing guidelines and our immunocompromised population.  ? ?   ?

## 2021-07-15 NOTE — Telephone Encounter (Signed)
Oral Oncology Pharmacist Encounter ?  ?Received notification from [facility] that prior authorization for Zejula is required. ?  ?PA submitted on CMM ?Key [id number]  ?Status is pending ?  ?Oral Oncology Clinic will continue to follow. ?  ?Darl Pikes, PharmD, BCPS, BCOP ?Hematology/Oncology Clinical Pharmacist ?[facility] Oral Chemotherapy Navigation Clinic ?[phone] ? ?07/15/2021 3:04 PM ? ?

## 2021-07-15 NOTE — Progress Notes (Signed)
? ?[facility] ?[address] ?[place], [place] [address] ? ? ?CLINIC:  ?Medical Oncology/Hematology ? ?PCP:  ?Everitt Amber, MD ?[address] / [place] [address] ?[phone] ? ? ?REASON FOR VISIT:  ?Follow-up for ovarian cancer ? ?NGS Results: not done ? ?CURRENT THERAPY: surveillance ? ?BRIEF ONCOLOGIC HISTORY:  ?Oncology History  ?Ovarian cancer on left [facility])  ?01/07/2021 Initial Diagnosis  ? Ovarian cancer on left [facility]) ? ?  ?01/30/2021 Cancer Staging  ? Staging form: Ovary, Fallopian Tube, and Primary Peritoneal Carcinoma, AJCC 8th Edition ?- Clinical stage from 01/30/2021: FIGO Stage IIIB (cT3b, cNX, cM0) - Signed by Derek Jack, MD on 01/30/2021 ?Histologic grade (G): G3 ?Histologic grading system: 4 grade system ? ?  ?02/11/2021 -  Chemotherapy  ? Patient is on Treatment Plan : OVARIAN Carboplatin (AUC 6) / Paclitaxel (175) q21d x 6 cycles  ? ?   ? ? ?CANCER STAGING: ? Cancer Staging  ?Ovarian cancer on left [facility]) ?Staging form: Ovary, Fallopian Tube, and Primary Peritoneal Carcinoma, AJCC 8th Edition ?- Clinical stage from 01/30/2021: FIGO Stage IIIB (cT3b, cNX, cM0) - Signed by Derek Jack, MD on 01/30/2021 ? ? ?INTERVAL HISTORY:  ?[name], a 76 y.o. female, returns for routine follow-up of her ovarian cancer. Orlean was last seen on 05/28/2021.  ? ?Today she reports feeling good. Her neuropathy is stable, and she continues to take Gabapentin TID. She continues to take Magnesium once daily. She has gained 5 lbs since her last visit. She reports abdominal distention and tightness.   ? ?REVIEW OF SYSTEMS:  ?Review of Systems  ?Constitutional:  Negative for appetite change and fatigue.  ?Respiratory:  Positive for cough.   ?Cardiovascular:  Positive for leg swelling.  ?Gastrointestinal:  Positive for abdominal distention and constipation.  ?Skin:  Positive for itching (L).  ?Neurological:  Positive for numbness.  ?Psychiatric/Behavioral:  Positive for sleep  disturbance.   ?All other systems reviewed and are negative. ? ?PAST MEDICAL/SURGICAL HISTORY:  ?Past Medical History:  ?Diagnosis Date  ? Anemia   ? CAD (coronary artery disease)   ? DM2 (diabetes mellitus, type 2) ([place])   ? GERD (gastroesophageal reflux disease)   ? HTN (hypertension)   ? Ovarian ca [facility])   ? Port-A-Cath in place 02/02/2021  ? ?Past Surgical History:  ?Procedure Laterality Date  ? IR IMAGING GUIDED PORT INSERTION  02/08/2021  ? ? ?SOCIAL HISTORY:  ?Social History  ? ?Socioeconomic History  ? Marital status: Single  ?  Spouse name: Not on file  ? Number of children: Not on file  ? Years of education: Not on file  ? Highest education level: Not on file  ?Occupational History  ? Not on file  ?Tobacco Use  ? Smoking status: Not on file  ? Smokeless tobacco: Not on file  ?Substance and Sexual Activity  ? Alcohol use: Not on file  ? Drug use: Not on file  ? Sexual activity: Not on file  ?Other Topics Concern  ? Not on file  ?Social History Narrative  ? Not on file  ? ?Social Determinants of Health  ? ?Financial Resource Strain: Low Risk   ? Difficulty of Paying Living Expenses: Not hard at all  ?Food Insecurity: No Food Insecurity  ? Worried About Charity fundraiser in the Last Year: Never true  ? Ran Out of Food in the Last Year: Never true  ?Transportation Needs: Unmet Transportation Needs  ? Lack of Transportation (Medical): Yes  ?  Lack of Transportation (Non-Medical): No  ?Physical Activity: Not on file  ?Stress: Stress Concern Present  ? Feeling of Stress : To some extent  ?Social Connections: Moderately Isolated  ? Frequency of Communication with Friends and Family: More than three times a week  ? Frequency of Social Gatherings with Friends and Family: More than three times a week  ? Attends Religious Services: More than 4 times per year  ? Active Member of Clubs or Organizations: No  ? Attends Archivist Meetings: Never  ? Marital Status: Widowed  ?Intimate Partner Violence: Not on file   ? ? ?FAMILY HISTORY:  ?No family history on file. ? ?CURRENT MEDICATIONS:  ?Current Outpatient Medications  ?Medication Sig Dispense Refill  ? acetaminophen (TYLENOL) 325 MG tablet Take 3 tablets (975 mg total) by mouth every 6 (six) hours as needed for moderate pain. 360 tablet 2  ? atorvastatin (LIPITOR) 40 MG tablet Take 40 mg by mouth at bedtime.    ? azelastine (ASTELIN) 0.1 % nasal spray Place 1 spray into both nostrils 2 (two) times daily as needed for allergies.    ? B-D ULTRAFINE III SHORT PEN 31G X 8 MM MISC SMARTSIG:1 Each SUB-Q Daily    ? baclofen (LIORESAL) 10 MG tablet Take 10 mg by mouth 3 (three) times daily as needed for muscle spasms.    ? CARBOPLATIN IV Inject into the vein every 21 ( twenty-one) days.    ? ciprofloxacin (CIPRO) 500 MG tablet Take 1 tablet (500 mg total) by mouth 2 (two) times daily. 10 tablet 0  ? clopidogrel (PLAVIX) 75 MG tablet Take 75 mg by mouth daily.    ? diphenhydrAMINE (BENADRYL) 25 MG tablet Take 50 mg by mouth daily as needed for allergies or itching.    ? diphenhydrAMINE-zinc acetate (BENADRYL) cream Apply 1 application topically 3 (three) times daily as needed for itching.    ? docusate sodium (COLACE) 100 MG capsule Take 1 capsule (100 mg total) by mouth daily as needed for mild constipation. 30 capsule 1  ? FARXIGA 10 MG TABS tablet Take 10 mg by mouth daily.    ? ferrous sulfate 325 (65 FE) MG tablet Take 325 mg by mouth every evening.    ? furosemide (LASIX) 20 MG tablet Take 1 tablet (20 mg total) by mouth in the morning. 30 tablet 2  ? gabapentin (NEURONTIN) 300 MG capsule Take 1 capsule (300 mg total) by mouth 2 (two) times daily. 60 capsule 2  ? HYDROcodone-acetaminophen (NORCO/VICODIN) 5-325 MG tablet Take 1 tablet by mouth every 6 (six) hours as needed for moderate pain. 60 tablet 0  ? insulin degludec (TRESIBA FLEXTOUCH) 100 UNIT/ML FlexTouch Pen Inject 19 Units into the skin at bedtime.    ? JANUVIA 100 MG tablet Take 100 mg by mouth daily.    ?  lidocaine-prilocaine (EMLA) cream Apply a small amount to port a cath site and cover with plastic wrap 1 hour prior to chemotherapy appointments (Patient not taking: Reported on 05/28/2021) 30 g 3  ? losartan (COZAAR) 50 MG tablet Take 50 mg by mouth at bedtime.    ? magnesium oxide (MAG-OX) 400 (240 Mg) MG tablet TAKE 1 TABLET BY MOUTH TWICE A DAY 180 tablet 1  ? metFORMIN (GLUCOPHAGE) 1000 MG tablet Take 1,000 mg by mouth daily.    ? Misc. Devices MISC Please provide patient with 3 diabetic nutritional supplements per day. 1 each 11  ? MYRBETRIQ 50 MG TB24 tablet Take 50 mg  by mouth daily.    ? PACLITAXEL IV Inject into the vein every 21 ( twenty-one) days.    ? pantoprazole (PROTONIX) 20 MG tablet Take 20 mg by mouth daily.    ? predniSONE (DELTASONE) 50 MG tablet Take 13hrs, 7hrs and 1hr before port placement 3 tablet 0  ? prochlorperazine (COMPAZINE) 10 MG tablet Take 1 tablet (10 mg total) by mouth every 6 (six) hours as needed (Nausea or vomiting). (Patient not taking: Reported on 05/28/2021) 30 tablet 1  ? Tetrahydrozoline HCl (REDNESS RELIEVER EYE DROPS OP) Place 1 drop into both eyes daily as needed (redness).    ? traMADol (ULTRAM) 50 MG tablet Take 2 tablets (100 mg total) by mouth every 6 (six) hours as needed. 240 tablet 0  ? ?No current facility-administered medications for this visit.  ? ? ?ALLERGIES:  ?Allergies  ?Allergen Reactions  ? Aspirin Hives  ? Iodinated Contrast Media Itching  ?  Rash   ? Mometasone Itching  ? Naproxen Sodium Itching  ? Penicillin G Swelling  ? Triamcinolone Itching  ? Codeine Palpitations  ? Ibuprofen Rash  ? ? ?PHYSICAL EXAM:  ?Performance status (ECOG): 1 - Symptomatic but completely ambulatory ? ?Vitals:  ? 07/15/21 1144  ?BP: (!) 129/96  ?Pulse: 97  ?Resp: 18  ?Temp: 98.1 ?F (36.7 ?C)  ?SpO2: 99%  ? ?Wt Readings from Last 3 Encounters:  ?07/15/21 202 lb 6.4 oz (91.8 kg)  ?05/28/21 197 lb 1.6 oz (89.4 kg)  ?05/07/21 195 lb 3.2 oz (88.5 kg)  ? ?Physical Exam ?Vitals  reviewed.  ?Constitutional:   ?   Appearance: Normal appearance. She is obese.  ?Cardiovascular:  ?   Rate and Rhythm: Normal rate and regular rhythm.  ?   Pulses: Normal pulses.  ?   Heart sounds: Normal heart sounds.  ?P

## 2021-07-15 NOTE — Telephone Encounter (Signed)
Oral Oncology Pharmacist Encounter ?  ?Prior Authorization for Rebecca [name] has been approved.   ?  ?PA# [id number] ?Effective dates: 07/15/21 until further notice ? ?Copay: $4.30 ?  ?Oral Oncology Clinic will continue to follow.  ? ?Darl Pikes, PharmD, BCPS. BCOP ?Hematology/Oncology Clinical Pharmacist ?[facility] Oral Chemotherapy Navigation Clinic ?[phone] ? ?07/15/2021 3:32 PM ? ?

## 2021-07-16 ENCOUNTER — Other Ambulatory Visit (HOSPITAL_COMMUNITY): Payer: Self-pay

## 2021-07-16 ENCOUNTER — Encounter (HOSPITAL_COMMUNITY): Payer: Self-pay

## 2021-07-16 NOTE — Telephone Encounter (Signed)
Oral Chemotherapy Pharmacist Encounter ? ?Medication will arrive from [place] to start on 07/17/21.  ? 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  ? ?Counseled patient on administration, dosing, side effects, monitoring, drug-food interactions, safe handling, storage, and disposal. ? ?Patient will take Zejula 100 mg capsules, 2 capsules (200 mg) by mouth once daily, with a glass of water, without regard to food. Patient will take at bedtime to reduce nausea and vomiting.  ? ?Zejula start date: 07/17/21  ? ?Adverse effects include but are not limited to: nausea, vomiting, increased blood pressure, mouth sores, fatigue, constipation, decreased blood counts, and joint pain. ?Myelodysplastic syndrome/acute myeloid leukemia (MDS/AML) have been reported (rarely) in clinical trials. ? ?Patient updated about close blood count monitoring at the initiation of Zejula for detection of thrombocytopenia. ? ?Patient has anti-emetic on hand and knows to take it if nausea develops.   ? ?Reviewed with patient importance of keeping a medication schedule and plan for any missed doses. No barriers to medication adherence identified. ? ?Medication reconciliation performed and medication/allergy list updated. ? ?Insurance authorization for Noel Journey has been obtained. ?Test claim at the pharmacy revealed copayment $4.30 for 1st fill of Zejula. ?This will ship from the [facility] on 07/16/21 to deliver to patient's home on 07/17/21. ? ?Patient informed the pharmacy will reach out 5-7 days prior to needing next fill of Zejula to coordinate continued medication acquisition to prevent break in therapy. ? ?All questions answered. ? ?Ms. Selbe voiced understanding and appreciation.  ? ?Medication education handout placed in mail for patient. Patient knows  to call the office with questions or concerns. Oral Chemotherapy Clinic phone number provided to patient.  ? ?Benn Moulder, PharmD ?Pharmacy Resident  ?07/16/2021 ?12:18 PM ? ?

## 2021-07-16 NOTE — Progress Notes (Signed)
Request sent for [facility] testing on [phone]. Specimen located at [facility]. Request faxed to Rebecca [name]'s attention at [phone]. ?

## 2021-07-17 ENCOUNTER — Other Ambulatory Visit (HOSPITAL_COMMUNITY): Payer: Self-pay

## 2021-07-22 ENCOUNTER — Other Ambulatory Visit (HOSPITAL_COMMUNITY): Payer: Self-pay

## 2021-07-22 ENCOUNTER — Encounter (HOSPITAL_COMMUNITY): Payer: Medicare Other | Admitting: Dietician

## 2021-07-22 ENCOUNTER — Telehealth (HOSPITAL_COMMUNITY): Payer: Self-pay | Admitting: *Deleted

## 2021-07-22 ENCOUNTER — Telehealth (HOSPITAL_COMMUNITY): Payer: Self-pay | Admitting: Dietician

## 2021-07-22 NOTE — Telephone Encounter (Signed)
  Called patient for scheduled nutrition telephone appointment. Patient reports she has another appointment scheduled this morning. Patient requested to reschedule visit. Telephone visit rescheduled for 5/25. Patient agreeable to day and time.

## 2021-07-22 NOTE — Telephone Encounter (Signed)
Patient has called to notify that, after long thought, she does not want to take Zejula due to side effects.  States that she wants to continue follow up, however is not interested in additional treatment.  Dr. Delton Coombes made aware.

## 2021-07-23 ENCOUNTER — Other Ambulatory Visit (HOSPITAL_COMMUNITY): Payer: Self-pay

## 2021-07-25 ENCOUNTER — Telehealth: Payer: Self-pay | Admitting: Dietician

## 2021-07-25 ENCOUNTER — Inpatient Hospital Stay: Payer: Medicare Other | Attending: Hematology | Admitting: Dietician

## 2021-07-25 NOTE — Telephone Encounter (Signed)
Nutrition Assessment   Reason for Assessment: Consult   ASSESSMENT: 76 year old female with stage III ovarian cancer. S/p bilateral salpingo-oophorectomy, infra gastric omentectomy,  primary tumor debulking by Dr. Denman George at [facility] (12/18/20). She completed 6 cycles adjuvant carboplatin/paclitaxel 05/28/21. Patient has declined initiating maintenance therapy with niraparib. She is currently on surveillance. Patient is under the care of Dr. Delton Coombes.   Spoke with patient via telephone. Patient reports daughter-in-law requested nutrition appointment due to patient being overweight. She reports feeling bad yesterday and didn't eat much, drank water all day. Patient says it felt like she had taken chemo. She is asking how long it will take to get the chemo out of her system. Patient recently seen by PCP. Per pt, she was advised to increase fluids and stop eating fatty foods. Patient reports she would drown if she drank anymore water. She also drinks cranberry juice. Patient does not feel she eats "bad." She has started walking and doing more around the house.   Breakfast - fried bacon, eggs, toast, sometimes bacon/lettuce/tom sandwich Lunch - sandwiches, banana, fruit, cranberry/apple juice Supper - fried fish sandwich, steaks, chicken dumplings, tossed salad Bedtime snack - oatmeal cookie, piece of pie, lemon square Patient drinking 3 Glucerna daily  Nutrition Focused Physical Exam: Unable to complete    Medications: reviewed    Labs: 5/4 - glucose 173   Anthropometrics:   Height: 5' 2.5" Weight: 202 lb 6.4 oz (07/15/21) UBW: 180 lb 12.8 oz (01/30/21) BMI: 36.43    NUTRITION DIAGNOSIS: Food and nutrition related knowledge deficit related to limited prior education as evidenced by pt request for healthy eating ideas and dietary recall high in saturated fats.     INTERVENTION:  Educated on benefits of plant-based diet Encouraged limiting intake of animal proteins, specifically red  meats and increasing plant-based proteins  Recommend decreasing Glucerna to once daily, suggested drinking supplement instead of eating sweet bedtime snack Encouraged daily body movement  Contact information, handouts, sample menus, and coupons mailed   MONITORING, EVALUATION, GOAL: Patient will adhere to dietary recommendations to promote healthier eating patterns, reduce risk for recurrence    Next Visit: No follow-up scheduled. Contact information provided, pt encouraged to contact with additional nutrition questions/concerns

## 2021-08-10 ENCOUNTER — Other Ambulatory Visit: Payer: Self-pay | Admitting: Nurse Practitioner

## 2021-08-13 ENCOUNTER — Other Ambulatory Visit (HOSPITAL_COMMUNITY): Payer: Self-pay

## 2021-08-14 ENCOUNTER — Inpatient Hospital Stay (HOSPITAL_COMMUNITY): Payer: Medicare Other

## 2021-08-14 ENCOUNTER — Other Ambulatory Visit (HOSPITAL_COMMUNITY): Payer: Self-pay | Admitting: *Deleted

## 2021-08-14 ENCOUNTER — Inpatient Hospital Stay (HOSPITAL_COMMUNITY): Payer: Medicare Other | Attending: Hematology | Admitting: Hematology

## 2021-08-14 VITALS — BP 135/70 | HR 94 | Temp 99.1°F | Resp 20 | Ht 61.42 in | Wt 203.8 lb

## 2021-08-14 DIAGNOSIS — E119 Type 2 diabetes mellitus without complications: Secondary | ICD-10-CM | POA: Diagnosis not present

## 2021-08-14 DIAGNOSIS — Z95828 Presence of other vascular implants and grafts: Secondary | ICD-10-CM

## 2021-08-14 DIAGNOSIS — C562 Malignant neoplasm of left ovary: Secondary | ICD-10-CM

## 2021-08-14 DIAGNOSIS — Z5111 Encounter for antineoplastic chemotherapy: Secondary | ICD-10-CM | POA: Insufficient documentation

## 2021-08-14 DIAGNOSIS — Z79899 Other long term (current) drug therapy: Secondary | ICD-10-CM | POA: Insufficient documentation

## 2021-08-14 LAB — COMPREHENSIVE METABOLIC PANEL
ALT: 16 U/L (ref 0–44)
AST: 14 U/L — ABNORMAL LOW (ref 15–41)
Albumin: 3.9 g/dL (ref 3.5–5.0)
Alkaline Phosphatase: 91 U/L (ref 38–126)
Anion gap: 9 (ref 5–15)
BUN: 25 mg/dL — ABNORMAL HIGH (ref 8–23)
CO2: 25 mmol/L (ref 22–32)
Calcium: 9.4 mg/dL (ref 8.9–10.3)
Chloride: 104 mmol/L (ref 98–111)
Creatinine, Ser: 1.22 mg/dL — ABNORMAL HIGH (ref 0.44–1.00)
GFR, Estimated: 47 mL/min — ABNORMAL LOW (ref >60)
Glucose, Bld: 157 mg/dL — ABNORMAL HIGH (ref 70–99)
Potassium: 4.4 mmol/L (ref 3.5–5.1)
Sodium: 138 mmol/L (ref 135–145)
Total Bilirubin: 0.6 mg/dL (ref 0.3–1.2)
Total Protein: 7.8 g/dL (ref 6.5–8.1)

## 2021-08-14 LAB — CBC WITH DIFFERENTIAL/PLATELET
Abs Immature Granulocytes: 0.04 10*3/uL (ref 0.00–0.07)
Basophils Absolute: 0 10*3/uL (ref 0.0–0.1)
Basophils Relative: 0 %
Eosinophils Absolute: 0.2 10*3/uL (ref 0.0–0.5)
Eosinophils Relative: 3 %
HCT: 29.7 % — ABNORMAL LOW (ref 36.0–46.0)
Hemoglobin: 10.1 g/dL — ABNORMAL LOW (ref 12.0–15.0)
Immature Granulocytes: 1 %
Lymphocytes Relative: 25 %
Lymphs Abs: 1.7 10*3/uL (ref 0.7–4.0)
MCH: 32 pg (ref 26.0–34.0)
MCHC: 34 g/dL (ref 30.0–36.0)
MCV: 94 fL (ref 80.0–100.0)
Monocytes Absolute: 0.6 10*3/uL (ref 0.1–1.0)
Monocytes Relative: 8 %
Neutro Abs: 4.2 10*3/uL (ref 1.7–7.7)
Neutrophils Relative %: 63 %
Platelets: 237 10*3/uL (ref 150–400)
RBC: 3.16 MIL/uL — ABNORMAL LOW (ref 3.87–5.11)
RDW: 12.9 % (ref 11.5–15.5)
WBC: 6.7 10*3/uL (ref 4.0–10.5)
nRBC: 0 % (ref 0.0–0.2)

## 2021-08-14 LAB — MAGNESIUM: Magnesium: 1.7 mg/dL (ref 1.7–2.4)

## 2021-08-14 MED ORDER — SODIUM CHLORIDE 0.9% FLUSH
10.0000 mL | Freq: Once | INTRAVENOUS | Status: AC
  Administered 2021-08-14: 10 mL via INTRAVENOUS

## 2021-08-14 MED ORDER — PREDNISONE 50 MG PO TABS: ORAL_TABLET | ORAL | 0 refills | Status: DC

## 2021-08-14 MED ORDER — HEPARIN SOD (PORK) LOCK FLUSH 100 UNIT/ML IV SOLN
500.0000 [IU] | Freq: Once | INTRAVENOUS | Status: AC
  Administered 2021-08-14: 500 [IU] via INTRAVENOUS

## 2021-08-14 NOTE — Patient Instructions (Signed)
[place] at [facility] Discharge Instructions  You were seen and examined today by Dr. Delton Coombes.  Dr. Delton Coombes discussed your most recent lab work and everything looks good.  Follow-up as scheduled in 4 months.    Thank you for choosing [place] at [facility] to provide your oncology and hematology care.  To afford each patient quality time with our provider, please arrive at least 15 minutes before your scheduled appointment time.   If you have a lab appointment with the [facility] please come in thru the Main Entrance and check in at the main information desk.  You need to re-schedule your appointment should you arrive 10 or more minutes late.  We strive to give you quality time with our providers, and arriving late affects you and other patients whose appointments are after yours.  Also, if you no show three or more times for appointments you may be dismissed from the clinic at the providers discretion.     Again, thank you for choosing [facility].  Our hope is that these requests will decrease the amount of time that you wait before being seen by our physicians.       _____________________________________________________________  Should you have questions after your visit to [facility], please contact our office at [phone] and follow the prompts.  Our office hours are 8:00 a.m. and 4:30 p.m. Monday - Friday.  Please note that voicemails left after 4:00 p.m. may not be returned until the following business day.  We are closed weekends and major holidays.  You do have access to a nurse 24-7, just call the main number to the clinic [phone] and do not press any options, hold on the line and a nurse will answer the phone.    For prescription refill requests, have your pharmacy contact our office and allow 72 hours.    Due to Covid, you will need to wear a mask upon entering the hospital. If you do  not have a mask, a mask will be given to you at the Main Entrance upon arrival. For doctor visits, patients may have 1 support person age 78 or older with them. For treatment visits, patients can not have anyone with them due to social distancing guidelines and our immunocompromised population.

## 2021-08-14 NOTE — Progress Notes (Signed)
Patients port flushed without difficulty.  Good blood return noted with no bruising or swelling noted at site.  Band aid applied.  VSS with discharge and left in satisfactory condition with no s/s of distress noted.   

## 2021-08-14 NOTE — Progress Notes (Signed)
[place], [place] [address]   CLINIC:  Medical Oncology/Hematology  PCP:  Everitt Amber, MD [address] / [place] [address] [phone]   REASON FOR VISIT:  Follow-up for ovarian cancer  PRIOR THERAPY: Exploratory laparotomy, BSO, and infra-gastric omentectomy on 12/19/2020 with Dr. Denman George  NGS Results: not done  CURRENT THERAPY: Carboplatin (AUC 6) / Paclitaxel (175) q21d x 6 cycles  BRIEF ONCOLOGIC HISTORY:  Oncology History  Ovarian cancer on left [facility])  01/07/2021 Initial Diagnosis   Ovarian cancer on left [facility])   01/30/2021 Cancer Staging   Staging form: Ovary, Fallopian Tube, and Primary Peritoneal Carcinoma, AJCC 8th Edition - Clinical stage from 01/30/2021: FIGO Stage IIIB (cT3b, cNX, cM0) - Signed by Derek Jack, MD on 01/30/2021 Histologic grade (G): G3 Histologic grading system: 4 grade system   02/11/2021 - 05/28/2021 Chemotherapy   Patient is on Treatment Plan : OVARIAN Carboplatin (AUC 6) / Paclitaxel (175) q21d x 6 cycles       CANCER STAGING: Cancer Staging  Ovarian cancer on left [facility]) Staging form: Ovary, Fallopian Tube, and Primary Peritoneal Carcinoma, AJCC 8th Edition - Clinical stage from 01/30/2021: FIGO Stage IIIB (cT3b, cNX, cM0) - Signed by Derek Jack, MD on 01/30/2021   INTERVAL HISTORY:  [name], a 76 y.o. female, returns for routine follow-up of her ovarian cancer. [name] was last seen on 07/15/2021.   Today she reports feeling good. She has decided against taking Niraparib. She denies abdominal pain. She continues to take magnesium and gabapentin. She reports dysuria.   REVIEW OF SYSTEMS:  Review of Systems  Constitutional:  Negative for appetite change and fatigue.  Respiratory:  Positive for cough and shortness of breath.   Gastrointestinal:  Positive for constipation. Negative for abdominal pain.  Genitourinary:  Positive for dysuria.   Neurological:   Positive for numbness.  Psychiatric/Behavioral:  Positive for sleep disturbance.   All other systems reviewed and are negative.   PAST MEDICAL/SURGICAL HISTORY:  Past Medical History:  Diagnosis Date   Anemia    CAD (coronary artery disease)    DM2 (diabetes mellitus, type 2) (HCC)    GERD (gastroesophageal reflux disease)    HTN (hypertension)    Ovarian ca ([place])    Port-A-Cath in place 02/02/2021   Past Surgical History:  Procedure Laterality Date   IR IMAGING GUIDED PORT INSERTION  02/08/2021    SOCIAL HISTORY:  Social History   Socioeconomic History   Marital status: Single    Spouse name: Not on file   Number of children: Not on file   Years of education: Not on file   Highest education level: Not on file  Occupational History   Not on file  Tobacco Use   Smoking status: Not on file   Smokeless tobacco: Not on file  Substance and Sexual Activity   Alcohol use: Not on file   Drug use: Not on file   Sexual activity: Not on file  Other Topics Concern   Not on file  Social History Narrative   Not on file   Social Determinants of Health   Financial Resource Strain: Low Risk  (02/05/2021)   Overall Financial Resource Strain (CARDIA)    Difficulty of Paying Living Expenses: Not hard at all  Food Insecurity: No Food Insecurity (02/05/2021)   Hunger Vital Sign    Worried About Running Out of Food in the Last Year: Never true    Ran Out of  Food in the Last Year: Never true  Transportation Needs: Unmet Transportation Needs (02/05/2021)   PRAPARE - Hydrologist (Medical): Yes    Lack of Transportation (Non-Medical): No  Physical Activity: Not on file  Stress: Stress Concern Present (02/05/2021)   [place]    Feeling of Stress : To some extent  Social Connections: Moderately Isolated (02/05/2021)   Social Connection and Isolation Panel [NHANES]    Frequency of Communication  with Friends and Family: More than three times a week    Frequency of Social Gatherings with Friends and Family: More than three times a week    Attends Religious Services: More than 4 times per year    Active Member of [employer] or Organizations: No    Attends Archivist Meetings: Never    Marital Status: Widowed  Intimate Partner Violence: Not on file    FAMILY HISTORY:  No family history on file.  CURRENT MEDICATIONS:  Current Outpatient Medications  Medication Sig Dispense Refill   acetaminophen (TYLENOL) 325 MG tablet Take 3 tablets (975 mg total) by mouth every 6 (six) hours as needed for moderate pain. 360 tablet 2   atorvastatin (LIPITOR) 40 MG tablet Take 40 mg by mouth at bedtime.     azelastine (ASTELIN) 0.1 % nasal spray Place 1 spray into both nostrils 2 (two) times daily as needed for allergies.     B-D ULTRAFINE III SHORT PEN 31G X 8 MM MISC SMARTSIG:1 Each SUB-Q Daily     baclofen (LIORESAL) 10 MG tablet Take 10 mg by mouth 3 (three) times daily as needed for muscle spasms.     CARBOPLATIN IV Inject into the vein every 21 ( twenty-one) days.     ciprofloxacin (CIPRO) 500 MG tablet Take 1 tablet (500 mg total) by mouth 2 (two) times daily. 10 tablet 0   clopidogrel (PLAVIX) 75 MG tablet Take 75 mg by mouth daily.     diphenhydrAMINE (BENADRYL) 25 MG tablet Take 50 mg by mouth daily as needed for allergies or itching.     diphenhydrAMINE-zinc acetate (BENADRYL) cream Apply 1 application topically 3 (three) times daily as needed for itching.     docusate sodium (COLACE) 100 MG capsule Take 1 capsule (100 mg total) by mouth daily as needed for mild constipation. 30 capsule 1   FARXIGA 10 MG TABS tablet Take 10 mg by mouth daily.     ferrous sulfate 325 (65 FE) MG tablet Take 325 mg by mouth every evening.     furosemide (LASIX) 20 MG tablet Take 1 tablet (20 mg total) by mouth in the morning. 30 tablet 2   gabapentin (NEURONTIN) 300 MG capsule Take 1 capsule (300 mg  total) by mouth 3 (three) times daily. 60 capsule 2   HYDROcodone-acetaminophen (NORCO/VICODIN) 5-325 MG tablet Take 1 tablet by mouth every 6 (six) hours as needed for moderate pain. 60 tablet 0   insulin degludec (TRESIBA FLEXTOUCH) 100 UNIT/ML FlexTouch Pen Inject 19 Units into the skin at bedtime.     JANUVIA 100 MG tablet Take 100 mg by mouth daily.     losartan (COZAAR) 50 MG tablet Take 50 mg by mouth at bedtime.     magnesium oxide (MAG-OX) 400 (240 Mg) MG tablet Take 1 tablet (400 mg total) by mouth 3 (three) times daily. 180 tablet 1   metFORMIN (GLUCOPHAGE) 1000 MG tablet Take 1,000 mg by mouth daily.  Misc. Devices MISC Please provide patient with 3 diabetic nutritional supplements per day. 1 each 11   MYRBETRIQ 50 MG TB24 tablet Take 50 mg by mouth daily.     niraparib tosylate (ZEJULA) 100 MG capsule Take 2 capsules (200 mg total) by mouth daily. May take at bedtime to reduce nausea and vomiting. 60 capsule 0   PACLITAXEL IV Inject into the vein every 21 ( twenty-one) days.     pantoprazole (PROTONIX) 20 MG tablet Take 20 mg by mouth daily.     predniSONE (DELTASONE) 50 MG tablet Take 13hrs, 7hrs and 1hr before port placement 3 tablet 0   Tetrahydrozoline HCl (REDNESS RELIEVER EYE DROPS OP) Place 1 drop into both eyes daily as needed (redness).     traMADol (ULTRAM) 50 MG tablet Take 2 tablets (100 mg total) by mouth every 6 (six) hours as needed. 240 tablet 0   No current facility-administered medications for this visit.    ALLERGIES:  Allergies  Allergen Reactions   Aspirin Hives   Iodinated Contrast Media Itching    Rash    Mometasone Itching   Naproxen Sodium Itching   Penicillin G Swelling   Triamcinolone Itching   Codeine Palpitations   Ibuprofen Rash    PHYSICAL EXAM:  Performance status (ECOG): 1 - Symptomatic but completely ambulatory  There were no vitals filed for this visit. Wt Readings from Last 3 Encounters:  07/15/21 202 lb 6.4 oz (91.8 kg)   05/28/21 197 lb 1.6 oz (89.4 kg)  05/07/21 195 lb 3.2 oz (88.5 kg)   Physical Exam   LABORATORY DATA:  I have reviewed the labs as listed.     Latest Ref Rng & Units 07/04/2021    9:57 AM 05/28/2021    8:24 AM 05/07/2021    8:11 AM  CBC  WBC 4.0 - 10.5 K/uL 4.8  4.5  5.8   Hemoglobin 12.0 - 15.0 g/dL 10.0  9.4  8.8   Hematocrit 36.0 - 46.0 % 29.7  27.9  26.4   Platelets 150 - 400 K/uL 214  178  188       Latest Ref Rng & Units 07/04/2021    9:57 AM 05/28/2021    8:24 AM 05/07/2021    8:11 AM  CMP  Glucose 70 - 99 mg/dL 173  204  217   BUN 8 - 23 mg/dL 19  28  36   Creatinine 0.44 - 1.00 mg/dL 1.04  1.23  1.11   Sodium 135 - 145 mmol/L 139  139  140   Potassium 3.5 - 5.1 mmol/L 3.8  4.3  4.3   Chloride 98 - 111 mmol/L 105  108  104   CO2 22 - 32 mmol/L '27  23  26   '$ Calcium 8.9 - 10.3 mg/dL 9.4  9.3  9.2   Total Protein 6.5 - 8.1 g/dL 7.7  7.2  7.1   Total Bilirubin 0.3 - 1.2 mg/dL 0.6  0.2  0.4   Alkaline Phos 38 - 126 U/L 82  85  73   AST 15 - 41 U/L '14  16  14   '$ ALT 0 - 44 U/L '14  15  14     '$ DIAGNOSTIC IMAGING:  I have independently reviewed the scans and discussed with the patient. No results found.   ASSESSMENT:  Stage IIIb high-grade serous ovarian carcinoma: - Presentation with abdominal distention and bloating sensation. - CT abdomen at outside hospital on 12/05/2020 with findings concerning for ovarian  neoplasm. - CT chest without contrast on 12/18/2020 with scattered solid pulmonary nodules measuring up to 3 mm, indeterminate. - CA125 on 12/05/2020-10000 - 12/18/2020 exploratory laparotomy, bilateral salpingo-oophorectomy, infra gastric omentectomy, R0 primary tumor debulking by Dr. Denman George at [facility]. - Pathology-left ovary high-grade serous carcinoma, greatest dimension 26.5 cm, left ovarian capsule intact.  Fallopian tube surface involvement present, omentum involvement present, largest extrapelvic peritoneal focus-macroscopic, peritoneal/ascitic fluid involvement  negative.  PT3BPNX.  FIGO stage IIIb. - She was evaluated for FLORA 5 clinical trial, found not to be a candidate. - Germline mutation testing was negative.     Social/family history: - She is seen with her daughter-in-law today.  She lives by herself.  She ambulates with the help of cane.  She retired after working at [employer].  She is non-smoker. - Brother had prostate cancer.  Another brother had throat cancer.  Sister had kidney cancer.  Grandson had bladder cancer at age 69.  Paternal uncle had head and neck cancer.   PLAN:  Stage IIIb high-grade serous ovarian carcinoma: -CTAP (07/04/2021): No evidence of recurrence or metastatic disease.  Incisional hernia was seen. - I have recommended maintenance therapy with niraparib.  After reading the side effects of the medications, she decided not to pursue maintenance option. - Reviewed labs today which showed normal CBC with mild anemia.  LFTs are normal.  Creatinine is mildly elevated at 1.22. - I have recommended a follow-up in 4 months with repeat CA125, CT AP with contrast.  2.  Hypomagnesemia: -Continue magnesium 3 times daily.  Magnesium today is 1.7.  3.  Type 2 diabetes: -Continue metformin, Januvia and Tresiba.  4.  Peripheral neuropathy: -Continue gabapentin 300 mg 3 times daily.  Symptoms well controlled.    Orders placed this encounter:  No orders of the defined types were placed in this encounter.    Derek Jack, MD [place] [phone]   I, Thana Ates, am acting as a scribe for Dr. Derek Jack.  I, Derek Jack MD, have reviewed the above documentation for accuracy and completeness, and I agree with the above.

## 2021-09-16 ENCOUNTER — Encounter (HOSPITAL_COMMUNITY): Payer: Self-pay | Admitting: Lab

## 2021-09-16 ENCOUNTER — Other Ambulatory Visit (HOSPITAL_COMMUNITY): Payer: Self-pay | Admitting: *Deleted

## 2021-09-16 DIAGNOSIS — R300 Dysuria: Secondary | ICD-10-CM

## 2021-09-16 MED ORDER — CIPROFLOXACIN HCL 500 MG PO TABS: 500.0000 mg | ORAL_TABLET | Freq: Two times a day (BID) | ORAL | 0 refills | Status: AC

## 2021-09-16 NOTE — Progress Notes (Unsigned)
Referral sent to [facility] urology .  Records faxed on 7/17

## 2021-09-16 NOTE — Progress Notes (Signed)
Patient's daughter-in-law Leveda Anna called to advise that she is having UTI symptoms again.  Orders sent to [facility] in [place] and asked that she collect urine prior to starting on Cipro 500 mg BID x 10 days.  In addition, a referral will be made to a urologist in [place], as she has had multiple events over the past few months.  Verbalized understanding.

## 2021-10-13 ENCOUNTER — Other Ambulatory Visit (HOSPITAL_COMMUNITY): Payer: Self-pay | Admitting: Hematology

## 2021-11-26 ENCOUNTER — Encounter: Payer: Self-pay | Admitting: *Deleted

## 2021-11-26 NOTE — Progress Notes (Signed)
Patient brought in by her son, as she had some concerns regarding her port.  States it has been a little sore and noticed white patches.  Upon examining her site, it was noted to be clean, dry and intact without signs of infection.  The minimal white patches appear to be pigmentation of her skin.  She also expressed concern that port flush not scheduled until October 11 th, prior to CT scan.  Made her aware that it would not hurt to wait until that visit to have flushed, however offered to flush for her today, which she declined.

## 2021-12-11 ENCOUNTER — Inpatient Hospital Stay: Payer: Medicare Other

## 2021-12-11 ENCOUNTER — Other Ambulatory Visit: Payer: Self-pay

## 2021-12-11 ENCOUNTER — Ambulatory Visit (HOSPITAL_COMMUNITY): Admission: RE | Admit: 2021-12-11 | Payer: Medicare Other | Source: Ambulatory Visit

## 2021-12-11 MED ORDER — PREDNISONE 50 MG PO TABS: ORAL_TABLET | ORAL | 0 refills | Status: DC

## 2021-12-11 MED ORDER — DIPHENHYDRAMINE HCL 50 MG PO TABS: 50.0000 mg | ORAL_TABLET | Freq: Once | ORAL | 0 refills | Status: DC

## 2021-12-15 ENCOUNTER — Other Ambulatory Visit (HOSPITAL_COMMUNITY): Payer: Self-pay | Admitting: Hematology

## 2021-12-18 ENCOUNTER — Ambulatory Visit: Payer: Medicare Other | Admitting: Hematology

## 2021-12-18 ENCOUNTER — Inpatient Hospital Stay: Payer: Medicare Other | Attending: Hematology

## 2021-12-18 ENCOUNTER — Ambulatory Visit (HOSPITAL_COMMUNITY)
Admission: RE | Admit: 2021-12-18 | Discharge: 2021-12-18 | Disposition: A | Discharge status: Home / Self Care | Payer: Medicare Other | Source: Ambulatory Visit | Attending: Hematology | Admitting: Hematology

## 2021-12-18 VITALS — BP 155/76 | HR 111 | Temp 98.1°F | Resp 18

## 2021-12-18 DIAGNOSIS — Z8052 Family history of malignant neoplasm of bladder: Secondary | ICD-10-CM | POA: Diagnosis not present

## 2021-12-18 DIAGNOSIS — Z95828 Presence of other vascular implants and grafts: Secondary | ICD-10-CM

## 2021-12-18 DIAGNOSIS — K469 Unspecified abdominal hernia without obstruction or gangrene: Secondary | ICD-10-CM | POA: Diagnosis not present

## 2021-12-18 DIAGNOSIS — Z808 Family history of malignant neoplasm of other organs or systems: Secondary | ICD-10-CM | POA: Diagnosis not present

## 2021-12-18 DIAGNOSIS — R971 Elevated cancer antigen 125 [CA 125]: Secondary | ICD-10-CM | POA: Insufficient documentation

## 2021-12-18 DIAGNOSIS — Z7985 Long-term (current) use of injectable non-insulin antidiabetic drugs: Secondary | ICD-10-CM | POA: Insufficient documentation

## 2021-12-18 DIAGNOSIS — Z8051 Family history of malignant neoplasm of kidney: Secondary | ICD-10-CM | POA: Diagnosis not present

## 2021-12-18 DIAGNOSIS — C562 Malignant neoplasm of left ovary: Secondary | ICD-10-CM | POA: Insufficient documentation

## 2021-12-18 DIAGNOSIS — Z90722 Acquired absence of ovaries, bilateral: Secondary | ICD-10-CM | POA: Insufficient documentation

## 2021-12-18 DIAGNOSIS — Z9221 Personal history of antineoplastic chemotherapy: Secondary | ICD-10-CM | POA: Insufficient documentation

## 2021-12-18 DIAGNOSIS — E119 Type 2 diabetes mellitus without complications: Secondary | ICD-10-CM | POA: Insufficient documentation

## 2021-12-18 DIAGNOSIS — Z8042 Family history of malignant neoplasm of prostate: Secondary | ICD-10-CM | POA: Diagnosis not present

## 2021-12-18 LAB — COMPREHENSIVE METABOLIC PANEL
ALT: 22 U/L (ref 0–44)
AST: 21 U/L (ref 15–41)
Albumin: 4.1 g/dL (ref 3.5–5.0)
Alkaline Phosphatase: 101 U/L (ref 38–126)
Anion gap: 15 (ref 5–15)
BUN: 23 mg/dL (ref 8–23)
CO2: 23 mmol/L (ref 22–32)
Calcium: 9.8 mg/dL (ref 8.9–10.3)
Chloride: 97 mmol/L — ABNORMAL LOW (ref 98–111)
Creatinine, Ser: 1.19 mg/dL — ABNORMAL HIGH (ref 0.44–1.00)
GFR, Estimated: 48 mL/min — ABNORMAL LOW (ref >60)
Glucose, Bld: 472 mg/dL — ABNORMAL HIGH (ref 70–99)
Potassium: 4.2 mmol/L (ref 3.5–5.1)
Sodium: 135 mmol/L (ref 135–145)
Total Bilirubin: 0.8 mg/dL (ref 0.3–1.2)
Total Protein: 8.1 g/dL (ref 6.5–8.1)

## 2021-12-18 LAB — CBC WITH DIFFERENTIAL/PLATELET
Abs Immature Granulocytes: 0.1 10*3/uL — ABNORMAL HIGH (ref 0.00–0.07)
Basophils Absolute: 0 10*3/uL (ref 0.0–0.1)
Basophils Relative: 0 %
Eosinophils Absolute: 0 10*3/uL (ref 0.0–0.5)
Eosinophils Relative: 0 %
HCT: 34.3 % — ABNORMAL LOW (ref 36.0–46.0)
Hemoglobin: 11.9 g/dL — ABNORMAL LOW (ref 12.0–15.0)
Immature Granulocytes: 1 %
Lymphocytes Relative: 6 %
Lymphs Abs: 0.9 10*3/uL (ref 0.7–4.0)
MCH: 30.9 pg (ref 26.0–34.0)
MCHC: 34.7 g/dL (ref 30.0–36.0)
MCV: 89.1 fL (ref 80.0–100.0)
Monocytes Absolute: 0.1 10*3/uL (ref 0.1–1.0)
Monocytes Relative: 1 %
Neutro Abs: 12.9 10*3/uL — ABNORMAL HIGH (ref 1.7–7.7)
Neutrophils Relative %: 92 %
Platelets: 289 10*3/uL (ref 150–400)
RBC: 3.85 MIL/uL — ABNORMAL LOW (ref 3.87–5.11)
RDW: 13.2 % (ref 11.5–15.5)
WBC: 14.1 10*3/uL — ABNORMAL HIGH (ref 4.0–10.5)
nRBC: 0 % (ref 0.0–0.2)

## 2021-12-18 LAB — MAGNESIUM: Magnesium: 1.4 mg/dL — ABNORMAL LOW (ref 1.7–2.4)

## 2021-12-18 MED ORDER — HEPARIN SOD (PORK) LOCK FLUSH 100 UNIT/ML IV SOLN
500.0000 [IU] | Freq: Once | INTRAVENOUS | Status: AC
  Administered 2021-12-18: 500 [IU] via INTRAVENOUS

## 2021-12-18 MED ORDER — IOHEXOL 300 MG/ML  SOLN
80.0000 mL | Freq: Once | INTRAMUSCULAR | Status: AC | PRN
  Administered 2021-12-18: 80 mL via INTRAVENOUS

## 2021-12-18 MED ORDER — HEPARIN SOD (PORK) LOCK FLUSH 100 UNIT/ML IV SOLN: 500.0000 [IU] | Freq: Once | INTRAVENOUS | Status: DC

## 2021-12-18 MED ORDER — SODIUM CHLORIDE 0.9% FLUSH
10.0000 mL | INTRAVENOUS | Status: DC | PRN
  Administered 2021-12-18: 10 mL via INTRAVENOUS

## 2021-12-18 NOTE — Progress Notes (Signed)
Patients port flushed without difficulty.  Good blood return noted with no bruising or swelling noted at site.  Patient remains accessed for CT scan.  °

## 2021-12-18 NOTE — Addendum Note (Signed)
Addended by: Tally Due on: 12/18/2021 04:50 PM   Modules accepted: Orders

## 2021-12-18 NOTE — Patient Instructions (Signed)
[facility] [place]  Discharge Instructions: Thank you for choosing  Cancer Center to provide your oncology and hematology care.  If you have a lab appointment with the Cancer Center, please come in thru the Main Entrance and check in at the main information desk.  Wear comfortable clothing and clothing appropriate for easy access to any Portacath or PICC line.   We strive to give you quality time with your provider. You may need to reschedule your appointment if you arrive late (15 or more minutes).  Arriving late affects you and other patients whose appointments are after yours.  Also, if you miss three or more appointments without notifying the office, you may be dismissed from the clinic at the provider's discretion.      For prescription refill requests, have your pharmacy contact our office and allow 72 hours for refills to be completed.     To help prevent nausea and vomiting after your treatment, we encourage you to take your nausea medication as directed.  BELOW ARE SYMPTOMS THAT SHOULD BE REPORTED IMMEDIATELY: *FEVER GREATER THAN 100.4 F (38 C) OR HIGHER *CHILLS OR SWEATING *NAUSEA AND VOMITING THAT IS NOT CONTROLLED WITH YOUR NAUSEA MEDICATION *UNUSUAL SHORTNESS OF BREATH *UNUSUAL BRUISING OR BLEEDING *URINARY PROBLEMS (pain or burning when urinating, or frequent urination) *BOWEL PROBLEMS (unusual diarrhea, constipation, pain near the anus) TENDERNESS IN MOUTH AND THROAT WITH OR WITHOUT PRESENCE OF ULCERS (sore throat, sores in mouth, or a toothache) UNUSUAL RASH, SWELLING OR PAIN  UNUSUAL VAGINAL DISCHARGE OR ITCHING   Items with * indicate a potential emergency and should be followed up as soon as possible or go to the Emergency Department if any problems should occur.  Please show the CHEMOTHERAPY ALERT CARD or IMMUNOTHERAPY ALERT CARD at check-in to the Emergency Department and triage nurse.  Should you have questions after your visit or need to  cancel or reschedule your appointment, please contact [facility] [place] [phone]  and follow the prompts.  Office hours are 8:00 a.m. to 4:30 p.m. Monday - Friday. Please note that voicemails left after 4:00 p.m. may not be returned until the following business day.  We are closed weekends and major holidays. You have access to a nurse at all times for urgent questions. Please call the main number to the clinic [phone] and follow the prompts.  For any non-urgent questions, you may also contact your provider using MyChart. We now offer e-Visits for anyone 18 and older to request care online for non-urgent symptoms. For details visit [email or web address].   Also download the MyChart app! Go to the app store, search "MyChart", open the app, select , and log in with your MyChart username and password.  Masks are optional in the cancer centers. If you would like for your care team to wear a mask while they are taking care of you, please let them know. You may have one support person who is at least 74 years old accompany you for your appointments.  

## 2021-12-18 NOTE — Progress Notes (Signed)
Patients port flushed without difficulty.  Good blood return noted with no bruising or swelling noted at site.  Band aid applied.  VSS with discharge and left in satisfactory condition with no s/s of distress noted.   

## 2021-12-20 LAB — CA 125: Cancer Antigen (CA) 125: 14.7 U/mL (ref 0.0–38.1)

## 2021-12-30 ENCOUNTER — Inpatient Hospital Stay (HOSPITAL_BASED_OUTPATIENT_CLINIC_OR_DEPARTMENT_OTHER): Payer: Medicare Other | Admitting: Hematology

## 2021-12-30 VITALS — BP 160/81 | HR 103 | Temp 99.0°F | Resp 19 | Wt 201.5 lb

## 2021-12-30 DIAGNOSIS — C562 Malignant neoplasm of left ovary: Secondary | ICD-10-CM | POA: Diagnosis not present

## 2021-12-30 MED ORDER — MAGNESIUM OXIDE -MG SUPPLEMENT 400 (240 MG) MG PO TABS: 1.0000 | ORAL_TABLET | Freq: Two times a day (BID) | ORAL | 3 refills | Status: DC

## 2021-12-30 NOTE — Progress Notes (Signed)
Rebecca [name], [place] [id number]   CLINIC:  Medical Oncology/Hematology  PCP:  Everitt Amber, MD [address] / [place] [address] [phone]   REASON FOR VISIT:  Follow-up for ovarian cancer  PRIOR THERAPY: Exploratory laparotomy, BSO, and infra-gastric omentectomy on 12/19/2020 with Dr. Denman George  NGS Results: not done  CURRENT THERAPY: Carboplatin (AUC 6) / Paclitaxel (175) q21d x 6 cycles  BRIEF ONCOLOGIC HISTORY:  Oncology History  Ovarian cancer on left [facility])  01/07/2021 Initial Diagnosis   Ovarian cancer on left [facility])   01/30/2021 Cancer Staging   Staging form: Ovary, Fallopian Tube, and Primary Peritoneal Carcinoma, AJCC 8th Edition - Clinical stage from 01/30/2021: FIGO Stage IIIB (cT3b, cNX, cM0) - Signed by Derek Jack, MD on 01/30/2021 Histologic grade (G): G3 Histologic grading system: 4 grade system   02/11/2021 - 05/28/2021 Chemotherapy   Patient is on Treatment Plan : OVARIAN Carboplatin (AUC 6) / Paclitaxel (175) q21d x 6 cycles       CANCER STAGING:  Cancer Staging  Ovarian cancer on left [facility]) Staging form: Ovary, Fallopian Tube, and Primary Peritoneal Carcinoma, AJCC 8th Edition - Clinical stage from 01/30/2021: FIGO Stage IIIB (cT3b, cNX, cM0) - Signed by Derek Jack, MD on 01/30/2021   INTERVAL HISTORY:  [name], a 76 y.o. female, seen for follow-up of ovarian cancer.  Denies any abdominal pains or bloating sensation.  Reports cough and shortness of breath on exertion is stable.  She reports some pain at the right abdominal hernia.  She reports numbness in the feet has improved.  She stopped taking gabapentin.  She also stopped taking magnesium.  REVIEW OF SYSTEMS:  Review of Systems  Constitutional:  Negative for appetite change and fatigue.  Respiratory:  Positive for cough and shortness of breath.   Gastrointestinal:  Positive for nausea.  Neurological:  Positive for  dizziness and headaches.  All other systems reviewed and are negative.   PAST MEDICAL/SURGICAL HISTORY:  Past Medical History:  Diagnosis Date   Anemia    CAD (coronary artery disease)    DM2 (diabetes mellitus, type 2) (HCC)    GERD (gastroesophageal reflux disease)    HTN (hypertension)    Ovarian ca ([place])    Port-A-Cath in place 02/02/2021   Past Surgical History:  Procedure Laterality Date   IR IMAGING GUIDED PORT INSERTION  02/08/2021    SOCIAL HISTORY:  Social History   Socioeconomic History   Marital status: Single    Spouse name: Not on file   Number of children: Not on file   Years of education: Not on file   Highest education level: Not on file  Occupational History   Not on file  Tobacco Use   Smoking status: Not on file   Smokeless tobacco: Not on file  Substance and Sexual Activity   Alcohol use: Not on file   Drug use: Not on file   Sexual activity: Not on file  Other Topics Concern   Not on file  Social History Narrative   Not on file   Social Determinants of Health   Financial Resource Strain: Low Risk  (02/05/2021)   Overall Financial Resource Strain (CARDIA)    Difficulty of Paying Living Expenses: Not hard at all  Food Insecurity: No Food Insecurity (02/05/2021)   Hunger Vital Sign    Worried About Running Out of Food in the Last Year: Never true    [place] in the  Last Year: Never true  Transportation Needs: Unmet Transportation Needs (02/05/2021)   PRAPARE - Hydrologist (Medical): Yes    Lack of Transportation (Non-Medical): No  Physical Activity: Not on file  Stress: Stress Concern Present (02/05/2021)   [place]    Feeling of Stress : To some extent  Social Connections: Moderately Isolated (02/05/2021)   Social Connection and Isolation Panel [NHANES]    Frequency of Communication with Friends and Family: More than three times a week     Frequency of Social Gatherings with Friends and Family: More than three times a week    Attends Religious Services: More than 4 times per year    Active Member of [employer] or Organizations: No    Attends Archivist Meetings: Never    Marital Status: Widowed  Intimate Partner Violence: Not on file    FAMILY HISTORY:  No family history on file.  CURRENT MEDICATIONS:  Current Outpatient Medications  Medication Sig Dispense Refill   acetaminophen (TYLENOL) 325 MG tablet Take 3 tablets (975 mg total) by mouth every 6 (six) hours as needed for moderate pain. 360 tablet 2   atorvastatin (LIPITOR) 40 MG tablet Take 40 mg by mouth at bedtime.     azelastine (ASTELIN) 0.1 % nasal spray Place 1 spray into both nostrils 2 (two) times daily as needed for allergies.     B-D ULTRAFINE III SHORT PEN 31G X 8 MM MISC SMARTSIG:1 Each SUB-Q Daily     baclofen (LIORESAL) 10 MG tablet Take 10 mg by mouth 3 (three) times daily as needed for muscle spasms.     CARBOPLATIN IV Inject into the vein every 21 ( twenty-one) days.     clopidogrel (PLAVIX) 75 MG tablet Take 75 mg by mouth daily.     diphenhydrAMINE-zinc acetate (BENADRYL) cream Apply 1 application topically 3 (three) times daily as needed for itching.     docusate sodium (COLACE) 100 MG capsule Take 1 capsule (100 mg total) by mouth daily as needed for mild constipation. 30 capsule 1   FARXIGA 10 MG TABS tablet Take 10 mg by mouth daily.     ferrous sulfate 325 (65 FE) MG tablet Take 325 mg by mouth every evening.     furosemide (LASIX) 20 MG tablet Take 1 tablet (20 mg total) by mouth in the morning. 30 tablet 2   gabapentin (NEURONTIN) 300 MG capsule Take 1 capsule (300 mg total) by mouth 3 (three) times daily. 60 capsule 2   HYDROcodone-acetaminophen (NORCO/VICODIN) 5-325 MG tablet Take 1 tablet by mouth every 6 (six) hours as needed for moderate pain. 60 tablet 0   insulin degludec (TRESIBA FLEXTOUCH) 100 UNIT/ML FlexTouch Pen Inject 19 Units  into the skin at bedtime.     JANUVIA 100 MG tablet Take 100 mg by mouth daily.     losartan (COZAAR) 50 MG tablet Take 50 mg by mouth at bedtime.     metFORMIN (GLUCOPHAGE) 1000 MG tablet Take 1,000 mg by mouth daily.     Misc. Devices MISC Please provide patient with 3 diabetic nutritional supplements per day. 1 each 11   MYRBETRIQ 50 MG TB24 tablet Take 50 mg by mouth daily.     niraparib tosylate (ZEJULA) 100 MG capsule Take 2 capsules (200 mg total) by mouth daily. May take at bedtime to reduce nausea and vomiting. 60 capsule 0   PACLITAXEL IV Inject into the vein every  21 ( twenty-one) days.     pantoprazole (PROTONIX) 20 MG tablet Take 20 mg by mouth daily.     predniSONE (DELTASONE) 50 MG tablet Take 13hrs, 7hrs and 1hr before port placement 3 tablet 0   Tetrahydrozoline HCl (REDNESS RELIEVER EYE DROPS OP) Place 1 drop into both eyes daily as needed (redness).     traMADol (ULTRAM) 50 MG tablet Take 2 tablets (100 mg total) by mouth every 6 (six) hours as needed. 240 tablet 0   magnesium oxide (MAG-OX) 400 (240 Mg) MG tablet Take 1 tablet (400 mg total) by mouth 2 (two) times daily. 180 tablet 3   No current facility-administered medications for this visit.    ALLERGIES:  Allergies  Allergen Reactions   Aspirin Hives   Iodinated Contrast Media Itching    Rash    Mometasone Itching   Naproxen Sodium Itching   Penicillin G Swelling   Triamcinolone Itching   Codeine Palpitations   Ibuprofen Rash    PHYSICAL EXAM:  Performance status (ECOG): 1 - Symptomatic but completely ambulatory  Vitals:   12/30/21 1106  BP: (!) 160/81  Pulse: (!) 103  Resp: 19  Temp: 99 F (37.2 C)  SpO2: 97%   Wt Readings from Last 3 Encounters:  12/30/21 201 lb 8 oz (91.4 kg)  08/14/21 203 lb 12.8 oz (92.4 kg)  07/15/21 202 lb 6.4 oz (91.8 kg)   Physical Exam   LABORATORY DATA:  I have reviewed the labs as listed.     Latest Ref Rng & Units 12/18/2021    3:06 PM 08/14/2021    2:03 PM  07/04/2021    9:57 AM  CBC  WBC 4.0 - 10.5 K/uL 14.1  6.7  4.8   Hemoglobin 12.0 - 15.0 g/dL 11.9  10.1  10.0   Hematocrit 36.0 - 46.0 % 34.3  29.7  29.7   Platelets 150 - 400 K/uL 289  237  214       Latest Ref Rng & Units 12/18/2021    3:06 PM 08/14/2021    2:03 PM 07/04/2021    9:57 AM  CMP  Glucose 70 - 99 mg/dL 472  157  173   BUN 8 - 23 mg/dL '23  25  19   '$ Creatinine 0.44 - 1.00 mg/dL 1.19  1.22  1.04   Sodium 135 - 145 mmol/L 135  138  139   Potassium 3.5 - 5.1 mmol/L 4.2  4.4  3.8   Chloride 98 - 111 mmol/L 97  104  105   CO2 22 - 32 mmol/L '23  25  27   '$ Calcium 8.9 - 10.3 mg/dL 9.8  9.4  9.4   Total Protein 6.5 - 8.1 g/dL 8.1  7.8  7.7   Total Bilirubin 0.3 - 1.2 mg/dL 0.8  0.6  0.6   Alkaline Phos 38 - 126 U/L 101  91  82   AST 15 - 41 U/L '21  14  14   '$ ALT 0 - 44 U/L '22  16  14     '$ DIAGNOSTIC IMAGING:  I have independently reviewed the scans and discussed with the patient. CT Abdomen Pelvis W Contrast  Result Date: 12/20/2021 CLINICAL DATA:  Left ovarian cancer s/p chemotherapy, exploratory laparotomy, bilateral salpingo oophorectomy and omentectomy 12/19/2020. Restaging. * Tracking Code: BO * EXAM: CT ABDOMEN AND PELVIS WITH CONTRAST TECHNIQUE: Multidetector CT imaging of the abdomen and pelvis was performed using the standard protocol following bolus administration of intravenous contrast.  RADIATION DOSE REDUCTION: This exam was performed according to the departmental dose-optimization program which includes automated exposure control, adjustment of the mA and/or kV according to patient size and/or use of iterative reconstruction technique. CONTRAST:  51m OMNIPAQUE IOHEXOL 300 MG/ML  SOLN COMPARISON:  07/04/2021 CT abdomen/pelvis. FINDINGS: Lower chest: Stable tiny 2 mm posterior basilar left lower lobe solid pulmonary nodule (series 5/image 22). No acute abnormality of the lung bases. Tip of superior approach central venous catheter is seen at the cavoatrial junction.  Coronary atherosclerosis. Hepatobiliary: Normal liver size. Stable subcentimeter hypodense right liver lesion (series 2/image 26), too small to characterize. No new liver lesions. Cholecystectomy. No biliary ductal dilatation. Pancreas: Normal, with no mass or duct dilation. Spleen: Normal size. No mass. Adrenals/Urinary Tract: Normal adrenals. Scattered subcentimeter hypodense right renal cortical lesions are too small to characterize, for which no follow-up imaging is recommended. No suspicious renal masses. No hydronephrosis. Normal bladder. Stomach/Bowel: Normal non-distended stomach. Normal caliber small bowel with no small bowel wall thickening. Oral contrast transits to the colon. Appendix is either surgically absent or diminutive. Mild scattered colonic diverticulosis with no large bowel wall thickening or acute pericolonic fat stranding. Vascular/Lymphatic: Atherosclerotic nonaneurysmal abdominal aorta. Patent portal, splenic, hepatic and renal veins. No pathologically enlarged lymph nodes in the abdomen or pelvis. Reproductive: Status post hysterectomy, with no abnormal findings at the vaginal cuff. No adnexal mass. Other: No pneumoperitoneum, ascites or focal fluid collection. No discrete peritoneal implants. Musculoskeletal: No aggressive appearing focal osseous lesions. Mild thoracolumbar spondylosis. IMPRESSION: 1. No evidence of recurrent metastatic disease in the abdomen or pelvis. 2. Chronic findings include: Mild scattered colonic diverticulosis. Coronary atherosclerosis. Aortic Atherosclerosis (ICD10-I70.0). Electronically Signed   By: JIlona SorrelM.D.   On: 12/20/2021 10:42     ASSESSMENT:  Stage IIIb high-grade serous ovarian carcinoma: - Presentation with abdominal distention and bloating sensation. - CT abdomen at outside hospital on 12/05/2020 with findings concerning for ovarian neoplasm. - CT chest without contrast on 12/18/2020 with scattered solid pulmonary nodules measuring up to 3  mm, indeterminate. - CA125 on 12/05/2020-10000 - 12/18/2020 exploratory laparotomy, bilateral salpingo-oophorectomy, infra gastric omentectomy, R[facility]-left ovary high-grade serous carcinoma, greatest dimension 26.5 cm, left ovarian capsule intact.  Fallopian tube surface involvement present, omentum involvement present, largest extrapelvic peritoneal focus-macroscopic, peritoneal/ascitic fluid involvement negative.  PT3BPNX.  FIGO stage IIIb. - She was evaluated for FLORA 5 clinical trial, found not to be a candidate. - Germline mutation testing was negative.  She has refused niraparib maintenance.     Social/family history: - She is seen with her daughter-in-law today.  She lives by herself.  She ambulates with the help of cane.  She retired after working at [employer]  She is non-smoker. - Brother had prostate cancer.  Another brother had throat cancer.  Sister had kidney cancer.  Grandson had bladder cancer at age 74  Paternal uncle had head and neck cancer.   PLAN:  Stage IIIb high-grade serous ovarian carcinoma: - CTAP (12/18/2021): No evidence of recurrence or metastatic disease.  Chronic findings were discussed. - She has refused niraparib maintenance in the past. - Reviewed labs which showed normal LFTs.  Creatinine is 1.19 and stable.  CBC was grossly normal.  White count was elevated as it was done at the time of UTI.  CA125 was 14.7. - She complains of pain at the abdominal hernia site.  We will make a follow-up with Dr. RDenman George  She will come back to [place] in 3 months with repeat labs and tumor marker.  2.  Hypomagnesemia: - She stopped taking magnesium.  Magnesium today is 1.4.  She was told to start back on magnesium 3 times daily.  3.  Type 2 diabetes: - Continue metformin, Januvia and Tresiba.  4.  Peripheral neuropathy: - She stopped taking gabapentin.  Numbness in the feet has improved.    Orders placed this  encounter:  No orders of the defined types were placed in this encounter.    Derek Jack, MD [place] [phone]

## 2021-12-30 NOTE — Patient Instructions (Addendum)
[place] at [facility] Discharge Instructions   You were seen and examined today by Dr. Delton Coombes.  He reviewed the results of your lab work. Your white blood cell count is high at 14.7. This could be explained by your recent kidney infection. Your magnesium is very low at 1.4. We sent a refill of your magnesium to your pharmacy. Please take as prescribed. Your blood sugar was 472. Please follow up with your primary care doctor regarding your blood sugar. All other results were normal/stable.   He reviewed the results of your CT scan which is normal. No evidence of cancer was seen.   Return as scheduled in 3 months. We will repeat lab work prior to your next visit.    Thank you for choosing [place] at [facility] to provide your oncology and hematology care.  To afford each patient quality time with our provider, please arrive at least 15 minutes before your scheduled appointment time.   If you have a lab appointment with the [facility] please come in thru the Main Entrance and check in at the main information desk.  You need to re-schedule your appointment should you arrive 10 or more minutes late.  We strive to give you quality time with our providers, and arriving late affects you and other patients whose appointments are after yours.  Also, if you no show three or more times for appointments you may be dismissed from the clinic at the providers discretion.     Again, thank you for choosing [facility].  Our hope is that these requests will decrease the amount of time that you wait before being seen by our physicians.       _____________________________________________________________  Should you have questions after your visit to [facility], please contact our office at [phone] and follow the prompts.  Our office hours are 8:00 a.m. and 4:30 p.m. Monday - Friday.  Please note that voicemails left after  4:00 p.m. may not be returned until the following business day.  We are closed weekends and major holidays.  You do have access to a nurse 24-7, just call the main number to the clinic [phone] and do not press any options, hold on the line and a nurse will answer the phone.    For prescription refill requests, have your pharmacy contact our office and allow 72 hours.    Due to Covid, you will need to wear a mask upon entering the hospital. If you do not have a mask, a mask will be given to you at the Main Entrance upon arrival. For doctor visits, patients may have 1 support person age 9 or older with them. For treatment visits, patients can not have anyone with them due to social distancing guidelines and our immunocompromised population.

## 2022-03-14 ENCOUNTER — Other Ambulatory Visit (HOSPITAL_COMMUNITY): Payer: Self-pay

## 2022-03-24 ENCOUNTER — Other Ambulatory Visit: Payer: Self-pay

## 2022-03-24 DIAGNOSIS — C562 Malignant neoplasm of left ovary: Secondary | ICD-10-CM

## 2022-03-25 ENCOUNTER — Inpatient Hospital Stay: Payer: Medicare Other | Attending: Hematology

## 2022-03-25 ENCOUNTER — Inpatient Hospital Stay: Payer: Medicare Other

## 2022-03-25 VITALS — BP 134/63 | HR 98 | Temp 98.6°F | Resp 20

## 2022-03-25 DIAGNOSIS — Z95828 Presence of other vascular implants and grafts: Secondary | ICD-10-CM

## 2022-03-25 DIAGNOSIS — C562 Malignant neoplasm of left ovary: Secondary | ICD-10-CM

## 2022-03-25 DIAGNOSIS — Z452 Encounter for adjustment and management of vascular access device: Secondary | ICD-10-CM | POA: Diagnosis not present

## 2022-03-25 LAB — CBC WITH DIFFERENTIAL/PLATELET
Abs Immature Granulocytes: 0.04 10*3/uL (ref 0.00–0.07)
Basophils Absolute: 0 10*3/uL (ref 0.0–0.1)
Basophils Relative: 0 %
Eosinophils Absolute: 0.2 10*3/uL (ref 0.0–0.5)
Eosinophils Relative: 3 %
HCT: 30.8 % — ABNORMAL LOW (ref 36.0–46.0)
Hemoglobin: 10.1 g/dL — ABNORMAL LOW (ref 12.0–15.0)
Immature Granulocytes: 1 %
Lymphocytes Relative: 25 %
Lymphs Abs: 1.5 10*3/uL (ref 0.7–4.0)
MCH: 30.5 pg (ref 26.0–34.0)
MCHC: 32.8 g/dL (ref 30.0–36.0)
MCV: 93.1 fL (ref 80.0–100.0)
Monocytes Absolute: 0.5 10*3/uL (ref 0.1–1.0)
Monocytes Relative: 8 %
Neutro Abs: 3.9 10*3/uL (ref 1.7–7.7)
Neutrophils Relative %: 63 %
Platelets: 240 10*3/uL (ref 150–400)
RBC: 3.31 MIL/uL — ABNORMAL LOW (ref 3.87–5.11)
RDW: 13.4 % (ref 11.5–15.5)
WBC: 6.2 10*3/uL (ref 4.0–10.5)
nRBC: 0 % (ref 0.0–0.2)

## 2022-03-25 LAB — COMPREHENSIVE METABOLIC PANEL
ALT: 13 U/L (ref 0–44)
AST: 15 U/L (ref 15–41)
Albumin: 3.8 g/dL (ref 3.5–5.0)
Alkaline Phosphatase: 65 U/L (ref 38–126)
Anion gap: 9 (ref 5–15)
BUN: 25 mg/dL — ABNORMAL HIGH (ref 8–23)
CO2: 23 mmol/L (ref 22–32)
Calcium: 9 mg/dL (ref 8.9–10.3)
Chloride: 103 mmol/L (ref 98–111)
Creatinine, Ser: 0.99 mg/dL (ref 0.44–1.00)
GFR, Estimated: 60 mL/min — ABNORMAL LOW (ref >60)
Glucose, Bld: 127 mg/dL — ABNORMAL HIGH (ref 70–99)
Potassium: 4 mmol/L (ref 3.5–5.1)
Sodium: 135 mmol/L (ref 135–145)
Total Bilirubin: 0.5 mg/dL (ref 0.3–1.2)
Total Protein: 7.4 g/dL (ref 6.5–8.1)

## 2022-03-25 LAB — MAGNESIUM: Magnesium: 1.9 mg/dL (ref 1.7–2.4)

## 2022-03-25 MED ORDER — SODIUM CHLORIDE 0.9% FLUSH
10.0000 mL | INTRAVENOUS | Status: DC | PRN
  Administered 2022-03-25: 10 mL via INTRAVENOUS

## 2022-03-25 MED ORDER — HEPARIN SOD (PORK) LOCK FLUSH 100 UNIT/ML IV SOLN
500.0000 [IU] | Freq: Once | INTRAVENOUS | Status: AC
  Administered 2022-03-25: 500 [IU] via INTRAVENOUS

## 2022-03-25 NOTE — Progress Notes (Signed)
Patients port flushed without difficulty.  Good blood return noted with no bruising or swelling noted at site.  Band aid applied.  VSS with discharge and left in satisfactory condition with no s/s of distress noted.   

## 2022-03-27 LAB — CA 125: Cancer Antigen (CA) 125: 15.5 U/mL (ref 0.0–38.1)

## 2022-04-01 ENCOUNTER — Inpatient Hospital Stay (HOSPITAL_BASED_OUTPATIENT_CLINIC_OR_DEPARTMENT_OTHER): Payer: Medicare Other | Admitting: Hematology

## 2022-04-01 ENCOUNTER — Other Ambulatory Visit: Payer: Self-pay

## 2022-04-01 ENCOUNTER — Encounter: Payer: Self-pay | Admitting: Hematology

## 2022-04-01 VITALS — BP 129/77 | HR 95 | Temp 98.0°F | Resp 18 | Wt 215.7 lb

## 2022-04-01 DIAGNOSIS — C562 Malignant neoplasm of left ovary: Secondary | ICD-10-CM | POA: Diagnosis not present

## 2022-04-01 DIAGNOSIS — K5903 Drug induced constipation: Secondary | ICD-10-CM

## 2022-04-01 MED ORDER — DOCUSATE SODIUM 100 MG PO CAPS: 100.0000 mg | ORAL_CAPSULE | Freq: Every day | ORAL | 1 refills | Status: DC | PRN

## 2022-04-01 NOTE — Progress Notes (Signed)
Rebecca [name], [place] [address]   CLINIC:  Medical Oncology/Hematology  PCP:  Everitt Amber, MD [address] / [place] [address] [phone]   REASON FOR VISIT:  Follow-up for ovarian cancer  PRIOR THERAPY: Exploratory laparotomy, BSO, and infra-gastric omentectomy on 12/19/2020 with Dr. Denman George  NGS Results: not done  CURRENT THERAPY: Carboplatin (AUC 6) / Paclitaxel (175) q21d x 6 cycles  BRIEF ONCOLOGIC HISTORY:  Oncology History  Ovarian cancer on left [facility])  01/07/2021 Initial Diagnosis   Ovarian cancer on left [facility])   01/30/2021 Cancer Staging   Staging form: Ovary, Fallopian Tube, and Primary Peritoneal Carcinoma, AJCC 8th Edition - Clinical stage from 01/30/2021: FIGO Stage IIIB (cT3b, cNX, cM0) - Signed by Derek Jack, MD on 01/30/2021 Histologic grade (G): G3 Histologic grading system: 4 grade system   02/11/2021 - 05/28/2021 Chemotherapy   Patient is on Treatment Plan : OVARIAN Carboplatin (AUC 6) / Paclitaxel (175) q21d x 6 cycles       CANCER STAGING:  Cancer Staging  Ovarian cancer on left [facility]) Staging form: Ovary, Fallopian Tube, and Primary Peritoneal Carcinoma, AJCC 8th Edition - Clinical stage from 01/30/2021: FIGO Stage IIIB (cT3b, cNX, cM0) - Signed by Derek Jack, MD on 01/30/2021   INTERVAL HISTORY:  Rebecca [name], a 77 y.o. female, seen for follow-up of ovarian cancer. She was last seen by me on 12/30/2021. She has recently established care with gyn onc Dr. Everitt Amber.  She states that she is doing okay overall. Her appetite level is at 100%. She occasionally feels as if her food gets stuck in her chest after eating over the last x1 week. She denies any history of similar complaints. She does have established GI care. Her energy level is at 75%. She occasionally feels numbness in her toes but not in her hands. She is taking magnesium BID. She complains of some constipation and  suspects that this may be due to resuming her PO iron x3 weeks ago. She reports that she will sometimes feel short of breath with activities such as walking between rooms in her house. However, this does not always occur with exertional activities.   REVIEW OF SYSTEMS:  Review of Systems  Constitutional:  Positive for fatigue. Negative for chills and fever.  HENT:   Positive for trouble swallowing. Negative for lump/mass, mouth sores, nosebleeds and sore throat.   Eyes:  Negative for eye problems.  Respiratory:  Positive for shortness of breath. Negative for cough.   Cardiovascular:  Negative for chest pain, leg swelling and palpitations.  Gastrointestinal:  Positive for constipation. Negative for abdominal pain, diarrhea, nausea and vomiting.  Genitourinary:  Negative for bladder incontinence, difficulty urinating, dysuria, frequency, hematuria and nocturia.   Musculoskeletal:  Negative for arthralgias, back pain, flank pain, myalgias and neck pain.  Skin:  Negative for itching and rash.  Neurological:  Positive for numbness. Negative for dizziness and headaches.  Hematological:  Does not bruise/bleed easily.  Psychiatric/Behavioral:  Negative for depression, sleep disturbance and suicidal ideas. The patient is not nervous/anxious.   All other systems reviewed and are negative.   PAST MEDICAL/SURGICAL HISTORY:  Past Medical History:  Diagnosis Date   Anemia    CAD (coronary artery disease)    DM2 (diabetes mellitus, type 2) (HCC)    GERD (gastroesophageal reflux disease)    HTN (hypertension)    Ovarian ca ([place])    Port-A-Cath in place 02/02/2021   Past Surgical History:  Procedure Laterality Date   IR IMAGING GUIDED PORT INSERTION  02/08/2021    SOCIAL HISTORY:  Social History   Socioeconomic History   Marital status: Single    Spouse name: Not on file   Number of children: Not on file   Years of education: Not on file   Highest education level: Not on file  Occupational  History   Not on file  Tobacco Use   Smoking status: Not on file   Smokeless tobacco: Not on file  Substance and Sexual Activity   Alcohol use: Not on file   Drug use: Not on file   Sexual activity: Not on file  Other Topics Concern   Not on file  Social History Narrative   Not on file   Social Determinants of Health   Financial Resource Strain: Low Risk  (02/05/2021)   Overall Financial Resource Strain (CARDIA)    Difficulty of Paying Living Expenses: Not hard at all  Food Insecurity: No Food Insecurity (02/05/2021)   Hunger Vital Sign    Worried About Running Out of Food in the Last Year: Never true    Ran Out of Food in the Last Year: Never true  Transportation Needs: Unmet Transportation Needs (02/05/2021)   PRAPARE - Hydrologist (Medical): Yes    Lack of Transportation (Non-Medical): No  Physical Activity: Not on file  Stress: Stress Concern Present (02/05/2021)   [place]    Feeling of Stress : To some extent  Social Connections: Moderately Isolated (02/05/2021)   Social Connection and Isolation Panel [NHANES]    Frequency of Communication with Friends and Family: More than three times a week    Frequency of Social Gatherings with Friends and Family: More than three times a week    Attends Religious Services: More than 4 times per year    Active Member of [employer] or Organizations: No    Attends Archivist Meetings: Never    Marital Status: Widowed  Intimate Partner Violence: Not on file    FAMILY HISTORY:  No family history on file.  CURRENT MEDICATIONS:  Current Outpatient Medications  Medication Sig Dispense Refill   acetaminophen (TYLENOL) 325 MG tablet Take 3 tablets (975 mg total) by mouth every 6 (six) hours as needed for moderate pain. 360 tablet 2   atorvastatin (LIPITOR) 40 MG tablet Take 40 mg by mouth at bedtime.     azelastine (ASTELIN) 0.1 % nasal  spray Place 1 spray into both nostrils 2 (two) times daily as needed for allergies.     B-D ULTRAFINE III SHORT PEN 31G X 8 MM MISC SMARTSIG:1 Each SUB-Q Daily     baclofen (LIORESAL) 10 MG tablet Take 10 mg by mouth 3 (three) times daily as needed for muscle spasms.     CARBOPLATIN IV Inject into the vein every 21 ( twenty-one) days.     clopidogrel (PLAVIX) 75 MG tablet Take 75 mg by mouth daily.     diphenhydrAMINE-zinc acetate (BENADRYL) cream Apply 1 application topically 3 (three) times daily as needed for itching.     docusate sodium (COLACE) 100 MG capsule Take 1 capsule (100 mg total) by mouth daily as needed for mild constipation. 30 capsule 1   FARXIGA 10 MG TABS tablet Take 10 mg by mouth daily.     ferrous sulfate 325 (65 FE) MG tablet Take 325 mg by mouth every evening.     furosemide (  LASIX) 20 MG tablet Take 1 tablet (20 mg total) by mouth in the morning. 30 tablet 2   gabapentin (NEURONTIN) 300 MG capsule Take 1 capsule (300 mg total) by mouth 3 (three) times daily. 60 capsule 2   HYDROcodone-acetaminophen (NORCO/VICODIN) 5-325 MG tablet Take 1 tablet by mouth every 6 (six) hours as needed for moderate pain. 60 tablet 0   insulin degludec (TRESIBA FLEXTOUCH) 100 UNIT/ML FlexTouch Pen Inject 19 Units into the skin at bedtime.     JANUVIA 100 MG tablet Take 100 mg by mouth daily.     losartan (COZAAR) 50 MG tablet Take 50 mg by mouth at bedtime.     magnesium oxide (MAG-OX) 400 (240 Mg) MG tablet Take 1 tablet (400 mg total) by mouth 2 (two) times daily. 180 tablet 3   metFORMIN (GLUCOPHAGE) 1000 MG tablet Take 1,000 mg by mouth daily.     Misc. Devices MISC Please provide patient with 3 diabetic nutritional supplements per day. 1 each 11   MYRBETRIQ 50 MG TB24 tablet Take 50 mg by mouth daily.     niraparib tosylate (ZEJULA) 100 MG capsule Take 2 capsules (200 mg total) by mouth daily. May take at bedtime to reduce nausea and vomiting. 60 capsule 0   PACLITAXEL IV Inject into the  vein every 21 ( twenty-one) days.     pantoprazole (PROTONIX) 20 MG tablet Take 20 mg by mouth daily.     predniSONE (DELTASONE) 50 MG tablet Take 13hrs, 7hrs and 1hr before port placement 3 tablet 0   Tetrahydrozoline HCl (REDNESS RELIEVER EYE DROPS OP) Place 1 drop into both eyes daily as needed (redness).     traMADol (ULTRAM) 50 MG tablet Take 2 tablets (100 mg total) by mouth every 6 (six) hours as needed. 240 tablet 0   No current facility-administered medications for this visit.    ALLERGIES:  Allergies  Allergen Reactions   Aspirin Hives   Iodinated Contrast Media Itching    Rash    Mometasone Itching   Naproxen Sodium Itching   Penicillin G Swelling   Triamcinolone Itching   Codeine Palpitations   Ibuprofen Rash    PHYSICAL EXAM:  Performance status (ECOG): 1 - Symptomatic but completely ambulatory  There were no vitals filed for this visit.  Wt Readings from Last 3 Encounters:  12/30/21 91.4 kg (201 lb 8 oz)  08/14/21 92.4 kg (203 lb 12.8 oz)  07/15/21 91.8 kg (202 lb 6.4 oz)   Physical Exam Vitals reviewed.  Constitutional:      Appearance: Normal appearance.  Cardiovascular:     Rate and Rhythm: Normal rate and regular rhythm.     Pulses: Normal pulses.     Heart sounds: Normal heart sounds.  Pulmonary:     Effort: Pulmonary effort is normal.     Breath sounds: Normal breath sounds.  Abdominal:     Palpations: There is no hepatomegaly, splenomegaly or mass.     Tenderness: There is no abdominal tenderness.     Hernia: A hernia is present.     Comments: Incisional hernia right abdomen  Neurological:     General: No focal deficit present.     Mental Status: She is alert and oriented to person, place, and time.  Psychiatric:        Mood and Affect: Mood normal.        Behavior: Behavior normal.      LABORATORY DATA:  I have reviewed the labs as listed.  Latest Ref Rng & Units 03/25/2022    2:15 PM 12/18/2021    3:06 PM 08/14/2021    2:03 PM   CBC  WBC 4.0 - 10.5 K/uL 6.2  14.1  6.7   Hemoglobin 12.0 - 15.0 g/dL 10.1  11.9  10.1   Hematocrit 36.0 - 46.0 % 30.8  34.3  29.7   Platelets 150 - 400 K/uL 240  289  237       Latest Ref Rng & Units 03/25/2022    2:15 PM 12/18/2021    3:06 PM 08/14/2021    2:03 PM  CMP  Glucose 70 - 99 mg/dL 127  472  157   BUN 8 - 23 mg/dL '25  23  25   '$ Creatinine 0.44 - 1.00 mg/dL 0.99  1.19  1.22   Sodium 135 - 145 mmol/L 135  135  138   Potassium 3.5 - 5.1 mmol/L 4.0  4.2  4.4   Chloride 98 - 111 mmol/L 103  97  104   CO2 22 - 32 mmol/L '23  23  25   '$ Calcium 8.9 - 10.3 mg/dL 9.0  9.8  9.4   Total Protein 6.5 - 8.1 g/dL 7.4  8.1  7.8   Total Bilirubin 0.3 - 1.2 mg/dL 0.5  0.8  0.6   Alkaline Phos 38 - 126 U/L 65  101  91   AST 15 - 41 U/L '15  21  14   '$ ALT 0 - 44 U/L '13  22  16     '$ DIAGNOSTIC IMAGING:  I have independently reviewed the scans and discussed with the patient. No results found.   ASSESSMENT:  Stage IIIb high-grade serous ovarian carcinoma: - Presentation with abdominal distention and bloating sensation. - CT abdomen at outside hospital on 12/05/2020 with findings concerning for ovarian neoplasm. - CT chest without contrast on 12/18/2020 with scattered solid pulmonary nodules measuring up to 3 mm, indeterminate. - CA125 on 12/05/2020-10000 - 12/18/2020 exploratory laparotomy, bilateral salpingo-oophorectomy, infra gastric omentectomy, R0 primary tumor debulking by Dr. Denman George at [facility]. - Pathology-left ovary high-grade serous carcinoma, greatest dimension 26.5 cm, left ovarian capsule intact.  Fallopian tube surface involvement present, omentum involvement present, largest extrapelvic peritoneal focus-macroscopic, peritoneal/ascitic fluid involvement negative.  PT3BPNX.  FIGO stage IIIb. - She was evaluated for FLORA 5 clinical trial, found not to be a candidate. - Germline mutation testing was negative.  She has refused niraparib maintenance.     Social/family history: - She is  seen with her daughter-in-law today.  She lives by herself.  She ambulates with the help of cane.  She retired after working at [employer].  She is non-smoker. - Brother had prostate cancer.  Another brother had throat cancer.  Sister had kidney cancer.  Grandson had bladder cancer at age 19.  Paternal uncle had head and neck cancer.   PLAN:  Stage IIIb high-grade serous ovarian carcinoma: - Last CTAP on 12/18/2021 with no evidence of recurrence or metastatic disease. - She has refused niraparib maintenance in the past. - She does not report any abdominal pain or distention.  She has mild pain at the hernia site and is seeing a Psychologist, sport and exercise for repair. - Labs on 03/25/2022 shows normal LFTs.  CBC grossly normal with hemoglobin 10.1.  She started taking iron tablet daily 3 weeks ago on the advice of Dr. Denman George. - Ca1 25 is normal at 15.5.  RTC 3 months for follow-up with repeat CA125, ferritin, iron panel, and  CTAP with contrast.  2.  Hypomagnesemia: - Continue magnesium twice daily.  Magnesium is normal today.  3.  Peripheral neuropathy: - Mild numbness in the feet is stable.  No neuropathic pains.    Orders placed this encounter:  No orders of the defined types were placed in this encounter.   I,Alexis Herring,acting as a Education administrator for [employer], MD.,have documented all relevant documentation on the behalf of Derek Jack, MD,as directed by  Derek Jack, MD while in the presence of Derek Jack, MD.  I, Derek Jack MD, have reviewed the above documentation for accuracy and completeness, and I agree with the above.   Derek Jack, MD [place] [phone]

## 2022-04-01 NOTE — Patient Instructions (Addendum)
[facility]  Discharge Instructions  You were seen and examined today by Dr. Delton Coombes.  Dr. Delton Coombes discussed your most recent lab work which revealed that everything looks good and stable.  Continue taking iron as prescribed. Dr. Delton Coombes is going to repeat CT of your abdomen and pelvis. Call your gastroenterologist if you keep having problems with your swallowing.  Follow-up as scheduled in 3 months with labs and scan.    Thank you for choosing [facility] to provide your oncology and hematology care.   To afford each patient quality time with our provider, please arrive at least 15 minutes before your scheduled appointment time. You may need to reschedule your appointment if you arrive late (10 or more minutes). Arriving late affects you and other patients whose appointments are after yours.  Also, if you miss three or more appointments without notifying the office, you may be dismissed from the clinic at the provider's discretion.    Again, thank you for choosing [facility].  Our hope is that these requests will decrease the amount of time that you wait before being seen by our physicians.   If you have a lab appointment with the [facility] please come in thru the Main Entrance and check in at the main information desk.           _____________________________________________________________  Should you have questions after your visit to [facility], please contact our office at [phone] and follow the prompts.  Our office hours are 8:00 a.m. to 4:30 p.m. Monday - Thursday and 8:00 a.m. to 2:30 p.m. Friday.  Please note that voicemails left after 4:00 p.m. may not be returned until the following business day.  We are closed weekends and all major holidays.  You do have access to a nurse 24-7, just call the main number to the clinic [phone] and do not press any options, hold on the line  and a nurse will answer the phone.    For prescription refill requests, have your pharmacy contact our office and allow 72 hours.    Masks are optional in the cancer centers. If you would like for your care team to wear a mask while they are taking care of you, please let them know. You may have one support person who is at least 75 years old accompany you for your appointments.

## 2022-06-17 ENCOUNTER — Other Ambulatory Visit: Payer: Self-pay | Admitting: *Deleted

## 2022-06-17 MED ORDER — PREDNISONE 50 MG PO TABS: ORAL_TABLET | ORAL | 0 refills | Status: DC

## 2022-06-24 ENCOUNTER — Ambulatory Visit (HOSPITAL_COMMUNITY)
Admission: RE | Admit: 2022-06-24 | Discharge: 2022-06-24 | Disposition: A | Discharge status: Home / Self Care | Payer: Medicare Other | Source: Ambulatory Visit | Attending: Hematology | Admitting: Hematology

## 2022-06-24 ENCOUNTER — Inpatient Hospital Stay: Payer: Medicare Other | Attending: Hematology

## 2022-06-24 VITALS — BP 127/52 | HR 100 | Resp 18

## 2022-06-24 DIAGNOSIS — C562 Malignant neoplasm of left ovary: Secondary | ICD-10-CM | POA: Insufficient documentation

## 2022-06-24 DIAGNOSIS — Z95828 Presence of other vascular implants and grafts: Secondary | ICD-10-CM

## 2022-06-24 LAB — COMPREHENSIVE METABOLIC PANEL
ALT: 13 U/L (ref 0–44)
AST: 17 U/L (ref 15–41)
Albumin: 3.9 g/dL (ref 3.5–5.0)
Alkaline Phosphatase: 72 U/L (ref 38–126)
Anion gap: 12 (ref 5–15)
BUN: 26 mg/dL — ABNORMAL HIGH (ref 8–23)
CO2: 23 mmol/L (ref 22–32)
Calcium: 9.5 mg/dL (ref 8.9–10.3)
Chloride: 101 mmol/L (ref 98–111)
Creatinine, Ser: 1.11 mg/dL — ABNORMAL HIGH (ref 0.44–1.00)
GFR, Estimated: 52 mL/min — ABNORMAL LOW (ref >60)
Glucose, Bld: 207 mg/dL — ABNORMAL HIGH (ref 70–99)
Potassium: 3.8 mmol/L (ref 3.5–5.1)
Sodium: 136 mmol/L (ref 135–145)
Total Bilirubin: 0.7 mg/dL (ref 0.3–1.2)
Total Protein: 7.6 g/dL (ref 6.5–8.1)

## 2022-06-24 LAB — CBC WITH DIFFERENTIAL/PLATELET
Abs Immature Granulocytes: 0.12 10*3/uL — ABNORMAL HIGH (ref 0.00–0.07)
Basophils Absolute: 0 10*3/uL (ref 0.0–0.1)
Basophils Relative: 0 %
Eosinophils Absolute: 0 10*3/uL (ref 0.0–0.5)
Eosinophils Relative: 0 %
HCT: 31.1 % — ABNORMAL LOW (ref 36.0–46.0)
Hemoglobin: 10.4 g/dL — ABNORMAL LOW (ref 12.0–15.0)
Immature Granulocytes: 1 %
Lymphocytes Relative: 10 %
Lymphs Abs: 0.9 10*3/uL (ref 0.7–4.0)
MCH: 30.6 pg (ref 26.0–34.0)
MCHC: 33.4 g/dL (ref 30.0–36.0)
MCV: 91.5 fL (ref 80.0–100.0)
Monocytes Absolute: 0.3 10*3/uL (ref 0.1–1.0)
Monocytes Relative: 3 %
Neutro Abs: 7.9 10*3/uL — ABNORMAL HIGH (ref 1.7–7.7)
Neutrophils Relative %: 86 %
Platelets: 263 10*3/uL (ref 150–400)
RBC: 3.4 MIL/uL — ABNORMAL LOW (ref 3.87–5.11)
RDW: 13.4 % (ref 11.5–15.5)
WBC: 9.2 10*3/uL (ref 4.0–10.5)
nRBC: 0 % (ref 0.0–0.2)

## 2022-06-24 LAB — IRON AND TIBC
Iron: 70 ug/dL (ref 28–170)
Saturation Ratios: 19 % (ref 10.4–31.8)
TIBC: 365 ug/dL (ref 250–450)
UIBC: 295 ug/dL

## 2022-06-24 LAB — FERRITIN: Ferritin: 186 ng/mL (ref 11–307)

## 2022-06-24 MED ORDER — IOHEXOL 300 MG/ML  SOLN
100.0000 mL | Freq: Once | INTRAMUSCULAR | Status: AC | PRN
  Administered 2022-06-24: 100 mL via INTRAVENOUS

## 2022-06-24 MED ORDER — SODIUM CHLORIDE 0.9% FLUSH
10.0000 mL | Freq: Once | INTRAVENOUS | Status: AC
  Administered 2022-06-24: 10 mL via INTRAVENOUS

## 2022-06-24 MED ORDER — HEPARIN SOD (PORK) LOCK FLUSH 100 UNIT/ML IV SOLN
500.0000 [IU] | Freq: Once | INTRAVENOUS | Status: AC
  Administered 2022-06-24: 500 [IU] via INTRAVENOUS

## 2022-06-24 NOTE — Progress Notes (Signed)
Port flushed with good blood return noted. No bruising or swelling at site. Bandaid applied and patient discharged in satisfactory condition. VVS stable with no signs or symptoms of distressed noted. 

## 2022-06-26 LAB — CA 125: Cancer Antigen (CA) 125: 17 U/mL (ref 0.0–38.1)

## 2022-07-01 NOTE — Progress Notes (Signed)
[facility] [address] [place] [address]    Clinic Day:  07/02/2022  Referring physician: Adolphus Birchwood, MD  Patient Care Team: Adolphus Birchwood, MD as PCP - General (Gynecologic Oncology) Doreatha Massed, MD as Medical Oncologist (Medical Oncology) Therese Sarah, RN as Oncology Nurse Navigator (Oncology) [name], Consuello Bossier, NP as Nurse Practitioner (Cardiothoracic Surgery)   ASSESSMENT & PLAN:   Assessment: 1. Stage IIIb high-grade serous ovarian carcinoma: - Presentation with abdominal distention and bloating sensation. - CT abdomen at outside hospital on 12/05/2020 with findings concerning for ovarian neoplasm. - CT chest without contrast on 12/18/2020 with scattered solid pulmonary nodules measuring up to 3 mm, indeterminate. - CA125 on 12/05/2020-10000 - 12/18/2020 exploratory laparotomy, bilateral salpingo-oophorectomy, infra gastric omentectomy, R0 primary tumor debulking by Dr. Andrey Farmer at [facility]. - Pathology-left ovary high-grade serous carcinoma, greatest dimension 26.5 cm, left ovarian capsule intact.  Fallopian tube surface involvement present, omentum involvement present, largest extrapelvic peritoneal focus-macroscopic, peritoneal/ascitic fluid involvement negative.  PT3BPNX.  FIGO stage IIIb. - Rebecca [name] was evaluated for FLORA 5 clinical trial, found not to be a candidate. - Germline mutation testing was negative. - 6 cycles of carboplatin and paclitaxel from 02/11/2021 through 05/28/2021.  Rebecca [name] has refused niraparib maintenance.    2. Social/family history: - Rebecca [name] is seen with her daughter-in-law today.  Rebecca [name] lives by herself.  Rebecca [name] ambulates with the help of cane.  Rebecca [name] retired after working at [employer].  Rebecca [name] is non-smoker. - Brother had prostate cancer.  Another brother had throat cancer.  Sister had kidney cancer.  Grandson had bladder cancer at age 71.  Paternal uncle had head and neck cancer.    Plan: 1. Stage IIIb high-grade serous  ovarian carcinoma: - Rebecca [name] denies any abdominal pain or distention.  Rebecca [name] has abdominal wall hernias for which Rebecca [name] is seeing [facility]. - Rebecca [name] was seen by Dr. Andrey Farmer few weeks ago. - Reviewed labs from 06/24/2022: Normal LFTs.  CBC grossly normal with mild anemia.  CA125 is 17.  Her CA125 has always been in the normal range. - CTAP on 06/24/2022: 1.5 x 1.4 cm soft tissue nodule along the left pelvic sidewall adjacent to the left vaginal cuff is new in the interval. - Because of this new finding, I have recommended follow-up in 3 to 4 months with repeat CTAP with contrast and a CA125 level.  2.  Hypomagnesemia: - Continue magnesium twice daily.    3.  Peripheral neuropathy: - Mild numbness in the feet is stable.  No neuropathic pains.  Orders Placed This Encounter  Procedures   CT Abdomen Pelvis W Contrast    Standing Status:   Future    Standing Expiration Date:   07/02/2023    Order Specific Question:   If indicated for the ordered procedure, I authorize the administration of contrast media per Radiology protocol    Answer:   Yes    Order Specific Question:   Does the patient have a contrast media/X-ray dye allergy?    Answer:   No    Order Specific Question:   Preferred imaging location?    Answer:   [facility]    Order Specific Question:   Release to patient    Answer:   Immediate [1]    Order Specific Question:   If indicated for the ordered procedure, I authorize the administration of oral contrast media per Radiology protocol    Answer:   Yes   CBC with  Differential/Platelet    Standing Status:   Future    Standing Expiration Date:   07/02/2023    Order Specific Question:   Release to patient    Answer:   Immediate   Comprehensive metabolic panel    Standing Status:   Future    Standing Expiration Date:   07/02/2023    Order Specific Question:   Release to patient    Answer:   Immediate   Magnesium    Standing Status:   Future    Standing Expiration Date:    07/02/2023    Order Specific Question:   Release to patient    Answer:   Immediate   CA 125    Standing Status:   Future    Standing Expiration Date:   07/02/2023   Iron and TIBC    Standing Status:   Future    Standing Expiration Date:   07/02/2023    Order Specific Question:   Release to patient    Answer:   Immediate   Ferritin    Standing Status:   Future    Standing Expiration Date:   07/02/2023    Order Specific Question:   Release to patient    Answer:   Immediate      I,Katie Daubenspeck,acting as a scribe for Doreatha Massed, MD.,have documented all relevant documentation on the behalf of Doreatha Massed, MD,as directed by  Doreatha Massed, MD while in the presence of Doreatha Massed, MD.   I, Doreatha Massed MD, have reviewed the above documentation for accuracy and completeness, and I agree with the above.   Doreatha Massed, MD   5/1/20245:30 PM  CHIEF COMPLAINT:   Diagnosis: ovarian cancer    Cancer Staging  Ovarian cancer on left [facility]) Staging form: Ovary, Fallopian Tube, and Primary Peritoneal Carcinoma, AJCC 8th Edition - Clinical stage from 01/30/2021: FIGO Stage IIIB (cT3b, cNX, cM0) - Signed by Doreatha Massed, MD on 01/30/2021    Prior Therapy: 1. Exploratory laparotomy, BSO, and infra-gastric omentectomy on 12/19/2020 with Dr. Andrey Farmer  2. Carboplatin (AUC 6) / Paclitaxel (175) q21d x 6 cycles 02/11/2021 - 05/28/2021   Current Therapy:  surveillance   HISTORY OF PRESENT ILLNESS:   Oncology History  Ovarian cancer on left [facility])  01/07/2021 Initial Diagnosis   Ovarian cancer on left [facility])   01/30/2021 Cancer Staging   Staging form: Ovary, Fallopian Tube, and Primary Peritoneal Carcinoma, AJCC 8th Edition - Clinical stage from 01/30/2021: FIGO Stage IIIB (cT3b, cNX, cM0) - Signed by Doreatha Massed, MD on 01/30/2021 Histologic grade (G): G3 Histologic grading system: 4 grade system   02/11/2021 - 05/28/2021 Chemotherapy    Patient is on Treatment Plan : OVARIAN Carboplatin (AUC 6) / Paclitaxel (175) q21d x 6 cycles        INTERVAL HISTORY:   Rebecca [name] is a 77 y.o. female presenting to clinic today for follow up of ovarian cancer. Rebecca [name] was last seen by me on 04/01/22.  Since her last visit, Rebecca [name] underwent surveillance CT A/P on 06/24/22 showing: new 1.5 cm soft tissue nodule along left pelvic sidewall adjacent to left vaginal cuff; stable 6 mm soft tissue nodule in anterior left subdiaphragmatic fat.  Today, Rebecca [name] states that Rebecca [name] is doing well overall. Her appetite level is at 100%. Her energy level is at 100%.  PAST MEDICAL HISTORY:   Past Medical History: Past Medical History:  Diagnosis Date   Anemia    CAD (coronary artery disease)    DM2 (diabetes mellitus, type  2) (HCC)    GERD (gastroesophageal reflux disease)    HTN (hypertension)    Ovarian ca (HCC)    Port-A-Cath in place 02/02/2021    Surgical History: Past Surgical History:  Procedure Laterality Date   IR IMAGING GUIDED PORT INSERTION  02/08/2021    Social History: Social History   Socioeconomic History   Marital status: Single    Spouse name: Not on file   Number of children: Not on file   Years of education: Not on file   Highest education level: Not on file  Occupational History   Not on file  Tobacco Use   Smoking status: Not on file   Smokeless tobacco: Not on file  Substance and Sexual Activity   Alcohol use: Not on file   Drug use: Not on file   Sexual activity: Not on file  Other Topics Concern   Not on file  Social History Narrative   Not on file   Social Determinants of Health   Financial Resource Strain: Low Risk  (02/05/2021)   Overall Financial Resource Strain (CARDIA)    Difficulty of Paying Living Expenses: Not hard at all  Food Insecurity: No Food Insecurity (02/05/2021)   Hunger Vital Sign    Worried About Running Out of Food in the Last Year: Never true    Ran Out of Food in the Last Year: Never true   Transportation Needs: Unmet Transportation Needs (02/05/2021)   PRAPARE - Administrator, Civil Service (Medical): Yes    Lack of Transportation (Non-Medical): No  Physical Activity: Not on file  Stress: Stress Concern Present (02/05/2021)   [employer] of Occupational Health - Occupational Stress Questionnaire    Feeling of Stress : To some extent  Social Connections: Moderately Isolated (02/05/2021)   Social Connection and Isolation Panel [NHANES]    Frequency of Communication with Friends and Family: More than three times a week    Frequency of Social Gatherings with Friends and Family: More than three times a week    Attends Religious Services: More than 4 times per year    Active Member of [employer] or Organizations: No    Attends Banker Meetings: Never    Marital Status: Widowed  Intimate Partner Violence: Not on file    Family History: History reviewed. No pertinent family history.  Current Medications:  Current Outpatient Medications:    acetaminophen (TYLENOL) 325 MG tablet, Take 3 tablets (975 mg total) by mouth every 6 (six) hours as needed for moderate pain., Disp: 360 tablet, Rfl: 2   atorvastatin (LIPITOR) 40 MG tablet, Take 40 mg by mouth at bedtime., Disp: , Rfl:    azelastine (ASTELIN) 0.1 % nasal spray, Place 1 spray into both nostrils 2 (two) times daily as needed for allergies., Disp: , Rfl:    B-D ULTRAFINE III SHORT PEN 31G X 8 MM MISC, SMARTSIG:1 Each SUB-Q Daily, Disp: , Rfl:    baclofen (LIORESAL) 10 MG tablet, Take 10 mg by mouth 3 (three) times daily as needed for muscle spasms., Disp: , Rfl:    clopidogrel (PLAVIX) 75 MG tablet, Take 75 mg by mouth daily., Disp: , Rfl:    diphenhydrAMINE-zinc acetate (BENADRYL) cream, Apply 1 application topically 3 (three) times daily as needed for itching., Disp: , Rfl:    docusate sodium (COLACE) 100 MG capsule, Take 1 capsule (100 mg total) by mouth daily as needed for mild constipation., Disp:  30 capsule, Rfl: 1   ferrous sulfate  325 (65 FE) MG tablet, Take 325 mg by mouth every evening., Disp: , Rfl:    furosemide (LASIX) 20 MG tablet, Take 1 tablet (20 mg total) by mouth in the morning., Disp: 30 tablet, Rfl: 2   gabapentin (NEURONTIN) 300 MG capsule, Take 1 capsule (300 mg total) by mouth 3 (three) times daily., Disp: 60 capsule, Rfl: 2   HYDROcodone-acetaminophen (NORCO/VICODIN) 5-325 MG tablet, Take 1 tablet by mouth every 6 (six) hours as needed for moderate pain., Disp: 60 tablet, Rfl: 0   insulin degludec (TRESIBA FLEXTOUCH) 100 UNIT/ML FlexTouch Pen, Inject 40 Units into the skin at bedtime., Disp: , Rfl:    JANUVIA 100 MG tablet, Take 100 mg by mouth daily., Disp: , Rfl:    losartan (COZAAR) 50 MG tablet, Take 50 mg by mouth at bedtime., Disp: , Rfl:    Misc. Devices MISC, Please provide patient with 3 diabetic nutritional supplements per day., Disp: 1 each, Rfl: 11   MYRBETRIQ 50 MG TB24 tablet, Take 50 mg by mouth daily., Disp: , Rfl:    niraparib tosylate (ZEJULA) 100 MG capsule, Take 2 capsules (200 mg total) by mouth daily. May take at bedtime to reduce nausea and vomiting., Disp: 60 capsule, Rfl: 0   pantoprazole (PROTONIX) 20 MG tablet, Take 20 mg by mouth daily., Disp: , Rfl:    predniSONE (DELTASONE) 50 MG tablet, Take 13hrs, 7hrs and 1hr before port placementTake 13hrs, 7hrs and 1hr before port placement, Disp: 3 tablet, Rfl: 0   Tetrahydrozoline HCl (REDNESS RELIEVER EYE DROPS OP), Place 1 drop into both eyes daily as needed (redness)., Disp: , Rfl:    traMADol (ULTRAM) 50 MG tablet, Take 2 tablets (100 mg total) by mouth every 6 (six) hours as needed., Disp: 240 tablet, Rfl: 0   magnesium oxide (MAG-OX) 400 (240 Mg) MG tablet, Take 1 tablet (400 mg total) by mouth 2 (two) times daily., Disp: 180 tablet, Rfl: 3   Allergies: Allergies  Allergen Reactions   Aspirin Hives   Iodinated Contrast Media Itching    Rash    Mometasone Itching   Naproxen Sodium Itching    Penicillin G Swelling   Triamcinolone Itching   Codeine Palpitations   Ibuprofen Rash    REVIEW OF SYSTEMS:   Review of Systems  Constitutional:  Negative for chills, fatigue and fever.  HENT:   Negative for lump/mass, mouth sores, nosebleeds, sore throat and trouble swallowing.   Eyes:  Negative for eye problems.  Respiratory:  Positive for cough. Negative for shortness of breath.   Cardiovascular:  Positive for leg swelling. Negative for chest pain and palpitations.  Gastrointestinal:  Positive for constipation. Negative for abdominal pain, diarrhea, nausea and vomiting.  Genitourinary:  Negative for bladder incontinence, difficulty urinating, dysuria, frequency, hematuria and nocturia.   Musculoskeletal:  Negative for arthralgias, back pain, flank pain, myalgias and neck pain.  Skin:  Negative for itching and rash.  Neurological:  Positive for numbness. Negative for dizziness and headaches.  Hematological:  Does not bruise/bleed easily.  Psychiatric/Behavioral:  Negative for depression, sleep disturbance and suicidal ideas. The patient is not nervous/anxious.   All other systems reviewed and are negative.    VITALS:   Blood pressure (!) 144/70, pulse (!) 104, temperature 98.3 F (36.8 C), resp. rate 18, weight 229 lb (103.9 kg), SpO2 95 %.  Wt Readings from Last 3 Encounters:  07/02/22 229 lb (103.9 kg)  04/01/22 215 lb 11.2 oz (97.8 kg)  12/30/21 201 lb 8 oz (  91.4 kg)    Body mass index is 42.68 kg/m.  Performance status (ECOG): 1 - Symptomatic but completely ambulatory  PHYSICAL EXAM:   Physical Exam Vitals and nursing note reviewed. Exam conducted with a chaperone present.  Constitutional:      Appearance: Normal appearance.  Cardiovascular:     Rate and Rhythm: Normal rate and regular rhythm.     Pulses: Normal pulses.     Heart sounds: Normal heart sounds.  Pulmonary:     Effort: Pulmonary effort is normal.     Breath sounds: Normal breath sounds.   Abdominal:     Palpations: Abdomen is soft. There is no hepatomegaly, splenomegaly or mass.     Tenderness: There is no abdominal tenderness.  Musculoskeletal:     Right lower leg: No edema.     Left lower leg: No edema.  Lymphadenopathy:     Cervical: No cervical adenopathy.     Right cervical: No superficial, deep or posterior cervical adenopathy.    Left cervical: No superficial, deep or posterior cervical adenopathy.     Upper Body:     Right upper body: No supraclavicular or axillary adenopathy.     Left upper body: No supraclavicular or axillary adenopathy.  Neurological:     General: No focal deficit present.     Mental Status: Rebecca [name] is alert and oriented to person, place, and time.  Psychiatric:        Mood and Affect: Mood normal.        Behavior: Behavior normal.     LABS:      Latest Ref Rng & Units 06/24/2022    1:33 PM 03/25/2022    2:15 PM 12/18/2021    3:06 PM  CBC  WBC 4.0 - 10.5 K/uL 9.2  6.2  14.1   Hemoglobin 12.0 - 15.0 g/dL 16.1  09.6  04.5   Hematocrit 36.0 - 46.0 % 31.1  30.8  34.3   Platelets 150 - 400 K/uL 263  240  289       Latest Ref Rng & Units 06/24/2022    1:33 PM 03/25/2022    2:15 PM 12/18/2021    3:06 PM  CMP  Glucose 70 - 99 mg/dL 409  811  914   BUN 8 - 23 mg/dL 26  25  23    Creatinine 0.44 - 1.00 mg/dL 7.82  9.56  2.13   Sodium 135 - 145 mmol/L 136  135  135   Potassium 3.5 - 5.1 mmol/L 3.8  4.0  4.2   Chloride 98 - 111 mmol/L 101  103  97   CO2 22 - 32 mmol/L 23  23  23    Calcium 8.9 - 10.3 mg/dL 9.5  9.0  9.8   Total Protein 6.5 - 8.1 g/dL 7.6  7.4  8.1   Total Bilirubin 0.3 - 1.2 mg/dL 0.7  0.5  0.8   Alkaline Phos 38 - 126 U/L 72  65  101   AST 15 - 41 U/L 17  15  21    ALT 0 - 44 U/L 13  13  22       No results found for: "CEA1", "CEA" / No results found for: "CEA1", "CEA" No results found for: "PSA1" No results found for: "CAN199" Lab Results  Component Value Date   CAN125 17.0 06/24/2022    No results found for:  "TOTALPROTELP", "ALBUMINELP", "A1GS", "A2GS", "BETS", "BETA2SER", "GAMS", "MSPIKE", "SPEI" Lab Results  Component Value Date   TIBC 365  06/24/2022   FERRITIN 186 06/24/2022   IRONPCTSAT 19 06/24/2022   Lab Results  Component Value Date   LDH 119 02/11/2021     STUDIES:   CT Abdomen Pelvis W Contrast  Result Date: 06/27/2022 CLINICAL DATA:  Ovarian cancer. Restaging. Status post exploratory laparotomy, BSO and omentectomy. * Tracking Code: BO * EXAM: CT ABDOMEN AND PELVIS WITH CONTRAST TECHNIQUE: Multidetector CT imaging of the abdomen and pelvis was performed using the standard protocol following bolus administration of intravenous contrast. RADIATION DOSE REDUCTION: This exam was performed according to the departmental dose-optimization program which includes automated exposure control, adjustment of the mA and/or kV according to patient size and/or use of iterative reconstruction technique. CONTRAST:  OMNIPAQUE IOHEXOL 300 MG/ML  SOLN COMPARISON:  12/18/2021 FINDINGS: Lower chest: Coronary artery calcification is evident. Moderate atherosclerotic calcification is noted in the wall of the thoracic aorta. Hepatobiliary: No suspicious focal abnormality within the liver parenchyma. 5 mm hypodensity identified previously in the right liver is unchanged in the interval. Nonvisualization of the gallbladder suggest prior cholecystectomy. No intrahepatic or extrahepatic biliary dilation. Pancreas: No focal mass lesion. No dilatation of the main duct. No intraparenchymal cyst. No peripancreatic edema. Spleen: No splenomegaly. No focal mass lesion. Adrenals/Urinary Tract: No adrenal nodule or mass. Kidneys unremarkable. No evidence for hydroureter. The urinary bladder appears normal for the degree of distention. Stomach/Bowel: Stomach is unremarkable. No gastric wall thickening. No evidence of outlet obstruction. Duodenum is normally positioned as is the ligament of Treitz. No small bowel wall  thickening. No small bowel dilatation. The terminal ileum is normal. No gross colonic mass. No colonic wall thickening. Diverticular changes are noted in the left colon without evidence of diverticulitis. Vascular/Lymphatic: There is moderate atherosclerotic calcification of the abdominal aorta without aneurysm. There is no gastrohepatic or hepatoduodenal ligament lymphadenopathy. No retroperitoneal or mesenteric lymphadenopathy. No pelvic sidewall lymphadenopathy. Reproductive: Uterus surgically absent.  No adnexal mass. Other: No intraperitoneal free fluid. 1.5 x 1.4 cm soft tissue nodule along the left pelvic sidewall adjacent to the left vaginal cuff (74/2) is new in the interval. 6 mm soft tissue nodule in the anterior left subdiaphragmatic fat just above the splenic flexure (21/2) is stable in the interval Musculoskeletal: No worrisome lytic or sclerotic osseous abnormality. IMPRESSION: 1. 1.5 x 1.4 cm soft tissue nodule along the left pelvic sidewall adjacent to the left vaginal cuff is new in the interval. Close follow-up recommended as metastatic disease a concern. 2. 6 mm soft tissue nodule in the anterior left subdiaphragmatic fat just above the splenic flexure is stable in the interval. Attention on follow-up recommended. 3. Left colonic diverticulosis without diverticulitis. 4.  Aortic Atherosclerosis (ICD10-I70.0). Electronically Signed   By: Kennith Center M.D.   On: 06/27/2022 08:55

## 2022-07-02 ENCOUNTER — Encounter: Payer: Self-pay | Admitting: Hematology

## 2022-07-02 ENCOUNTER — Inpatient Hospital Stay: Payer: Medicare Other | Attending: Hematology | Admitting: Hematology

## 2022-07-02 VITALS — BP 144/70 | HR 104 | Temp 98.3°F | Resp 18 | Wt 229.0 lb

## 2022-07-02 DIAGNOSIS — Z90722 Acquired absence of ovaries, bilateral: Secondary | ICD-10-CM | POA: Diagnosis not present

## 2022-07-02 DIAGNOSIS — Z8543 Personal history of malignant neoplasm of ovary: Secondary | ICD-10-CM | POA: Insufficient documentation

## 2022-07-02 DIAGNOSIS — Z9221 Personal history of antineoplastic chemotherapy: Secondary | ICD-10-CM | POA: Insufficient documentation

## 2022-07-02 DIAGNOSIS — C562 Malignant neoplasm of left ovary: Secondary | ICD-10-CM | POA: Diagnosis not present

## 2022-07-02 MED ORDER — MAGNESIUM OXIDE -MG SUPPLEMENT 400 (240 MG) MG PO TABS: 1.0000 | ORAL_TABLET | Freq: Two times a day (BID) | ORAL | 3 refills | Status: DC

## 2022-07-02 NOTE — Patient Instructions (Addendum)
[facility]  Discharge Instructions  You were seen and examined today by Dr. Ellin Saba.  Dr. Ellin Saba discussed your most recent lab work and CT scan which revealed that everything looks stable except CT scan shows a spot on the left pelvic sidewall.  Dr. Ellin Saba is going to repeat CT of your abdomen and pelvis to monitor the spot.   Follow-up as scheduled in 4 months.    Thank you for choosing [facility] to provide your oncology and hematology care.   To afford each patient quality time with our provider, please arrive at least 15 minutes before your scheduled appointment time. You may need to reschedule your appointment if you arrive late (10 or more minutes). Arriving late affects you and other patients whose appointments are after yours.  Also, if you miss three or more appointments without notifying the office, you may be dismissed from the clinic at the provider's discretion.    Again, thank you for choosing [facility].  Our hope is that these requests will decrease the amount of time that you wait before being seen by our physicians.   If you have a lab appointment with the Cancer Center - please note that after April 8th, all labs will be drawn in the cancer center.  You do not have to check in or register with the main entrance as you have in the past but will complete your check-in at the cancer center.            _____________________________________________________________  Should you have questions after your visit to [facility], please contact our office at [phone] and follow the prompts.  Our office hours are 8:00 a.m. to 4:30 p.m. Monday - Thursday and 8:00 a.m. to 2:30 p.m. Friday.  Please note that voicemails left after 4:00 p.m. may not be returned until the following business day.  We are closed weekends and all major holidays.  You do have access to a nurse 24-7, just call the  main number to the clinic [phone] and do not press any options, hold on the line and a nurse will answer the phone.    For prescription refill requests, have your pharmacy contact our office and allow 72 hours.    Masks are no longer required in the cancer centers. If you would like for your care team to wear a mask while they are taking care of you, please let them know. You may have one support person who is at least 75 years old accompany you for your appointments.

## 2022-11-06 ENCOUNTER — Other Ambulatory Visit: Payer: Self-pay

## 2022-11-06 ENCOUNTER — Inpatient Hospital Stay: Payer: Medicare Other | Attending: Hematology

## 2022-11-06 ENCOUNTER — Other Ambulatory Visit: Payer: Medicare Other

## 2022-11-06 ENCOUNTER — Ambulatory Visit (HOSPITAL_COMMUNITY): Admission: RE | Admit: 2022-11-06 | Payer: Medicare Other | Source: Ambulatory Visit

## 2022-11-06 DIAGNOSIS — Z8543 Personal history of malignant neoplasm of ovary: Secondary | ICD-10-CM | POA: Diagnosis present

## 2022-11-06 DIAGNOSIS — C562 Malignant neoplasm of left ovary: Secondary | ICD-10-CM

## 2022-11-06 DIAGNOSIS — Z79899 Other long term (current) drug therapy: Secondary | ICD-10-CM | POA: Diagnosis not present

## 2022-11-06 DIAGNOSIS — E1142 Type 2 diabetes mellitus with diabetic polyneuropathy: Secondary | ICD-10-CM | POA: Insufficient documentation

## 2022-11-06 LAB — CBC WITH DIFFERENTIAL/PLATELET
Abs Immature Granulocytes: 0.05 10*3/uL (ref 0.00–0.07)
Basophils Absolute: 0 10*3/uL (ref 0.0–0.1)
Basophils Relative: 0 %
Eosinophils Absolute: 0.1 10*3/uL (ref 0.0–0.5)
Eosinophils Relative: 2 %
HCT: 32.5 % — ABNORMAL LOW (ref 36.0–46.0)
Hemoglobin: 11.1 g/dL — ABNORMAL LOW (ref 12.0–15.0)
Immature Granulocytes: 1 %
Lymphocytes Relative: 20 %
Lymphs Abs: 1.4 10*3/uL (ref 0.7–4.0)
MCH: 30.7 pg (ref 26.0–34.0)
MCHC: 34.2 g/dL (ref 30.0–36.0)
MCV: 90 fL (ref 80.0–100.0)
Monocytes Absolute: 0.7 10*3/uL (ref 0.1–1.0)
Monocytes Relative: 9 %
Neutro Abs: 4.9 10*3/uL (ref 1.7–7.7)
Neutrophils Relative %: 68 %
Platelets: 266 10*3/uL (ref 150–400)
RBC: 3.61 MIL/uL — ABNORMAL LOW (ref 3.87–5.11)
RDW: 13.1 % (ref 11.5–15.5)
WBC: 7.2 10*3/uL (ref 4.0–10.5)
nRBC: 0 % (ref 0.0–0.2)

## 2022-11-06 LAB — IRON AND TIBC
Iron: 66 ug/dL (ref 28–170)
Saturation Ratios: 19 % (ref 10.4–31.8)
TIBC: 343 ug/dL (ref 250–450)
UIBC: 277 ug/dL

## 2022-11-06 LAB — COMPREHENSIVE METABOLIC PANEL
ALT: 18 U/L (ref 0–44)
AST: 16 U/L (ref 15–41)
Albumin: 4.1 g/dL (ref 3.5–5.0)
Alkaline Phosphatase: 74 U/L (ref 38–126)
Anion gap: 11 (ref 5–15)
BUN: 17 mg/dL (ref 8–23)
CO2: 25 mmol/L (ref 22–32)
Calcium: 9.4 mg/dL (ref 8.9–10.3)
Chloride: 104 mmol/L (ref 98–111)
Creatinine, Ser: 0.98 mg/dL (ref 0.44–1.00)
GFR, Estimated: >60 mL/min (ref >60)
Glucose, Bld: 75 mg/dL (ref 70–99)
Potassium: 3.8 mmol/L (ref 3.5–5.1)
Sodium: 140 mmol/L (ref 135–145)
Total Bilirubin: 0.6 mg/dL (ref 0.3–1.2)
Total Protein: 7.4 g/dL (ref 6.5–8.1)

## 2022-11-06 LAB — FERRITIN: Ferritin: 301 ng/mL (ref 11–307)

## 2022-11-06 LAB — MAGNESIUM: Magnesium: 1.8 mg/dL (ref 1.7–2.4)

## 2022-11-06 MED ORDER — HEPARIN SOD (PORK) LOCK FLUSH 100 UNIT/ML IV SOLN
500.0000 [IU] | Freq: Once | INTRAVENOUS | Status: AC
  Administered 2022-11-06: 500 [IU] via INTRAVENOUS

## 2022-11-06 MED ORDER — DIPHENHYDRAMINE HCL 50 MG PO TABS: 50.0000 mg | ORAL_TABLET | Freq: Once | ORAL | 0 refills | Status: DC

## 2022-11-06 MED ORDER — PREDNISONE 50 MG PO TABS: ORAL_TABLET | ORAL | 0 refills | Status: DC

## 2022-11-06 MED ORDER — SODIUM CHLORIDE 0.9% FLUSH
10.0000 mL | Freq: Once | INTRAVENOUS | Status: AC
  Administered 2022-11-06: 10 mL via INTRAVENOUS

## 2022-11-06 NOTE — Patient Instructions (Signed)

## 2022-11-06 NOTE — Progress Notes (Signed)
Patients port flushed without difficulty.  Good blood return noted with no bruising or swelling noted at site.  Band aid applied.  VSS with discharge and left in satisfactory condition with no s/s of distress noted.   

## 2022-11-07 LAB — CA 125: Cancer Antigen (CA) 125: 13.7 U/mL (ref 0.0–38.1)

## 2022-11-10 ENCOUNTER — Ambulatory Visit (HOSPITAL_COMMUNITY)
Admission: RE | Admit: 2022-11-10 | Discharge: 2022-11-10 | Disposition: A | Discharge status: Home / Self Care | Payer: Medicare Other | Source: Ambulatory Visit | Attending: Hematology | Admitting: Hematology

## 2022-11-10 DIAGNOSIS — C562 Malignant neoplasm of left ovary: Secondary | ICD-10-CM | POA: Diagnosis present

## 2022-11-10 MED ORDER — DIPHENHYDRAMINE HCL 25 MG PO CAPS: 50.0000 mg | ORAL_CAPSULE | Freq: Once | ORAL | Status: DC

## 2022-11-10 MED ORDER — PREDNISONE 50 MG PO TABS
50.0000 mg | ORAL_TABLET | Freq: Four times a day (QID) | ORAL | Status: DC
  Filled 2022-11-10: qty 1

## 2022-11-10 MED ORDER — DIPHENHYDRAMINE HCL 50 MG/ML IJ SOLN: 50.0000 mg | Freq: Once | INTRAMUSCULAR | Status: DC

## 2022-11-10 MED ORDER — IOHEXOL 300 MG/ML  SOLN
100.0000 mL | Freq: Once | INTRAMUSCULAR | Status: AC | PRN
  Administered 2022-11-10: 100 mL via INTRAVENOUS

## 2022-11-12 ENCOUNTER — Inpatient Hospital Stay (HOSPITAL_BASED_OUTPATIENT_CLINIC_OR_DEPARTMENT_OTHER): Payer: Medicare Other | Admitting: Hematology

## 2022-11-12 ENCOUNTER — Encounter: Payer: Self-pay | Admitting: Hematology

## 2022-11-12 VITALS — BP 142/68 | HR 107 | Temp 98.3°F | Resp 18 | Wt 214.0 lb

## 2022-11-12 DIAGNOSIS — C562 Malignant neoplasm of left ovary: Secondary | ICD-10-CM

## 2022-11-12 DIAGNOSIS — Z8543 Personal history of malignant neoplasm of ovary: Secondary | ICD-10-CM | POA: Diagnosis not present

## 2022-11-12 NOTE — Patient Instructions (Addendum)
[facility]  Discharge Instructions  You were seen and examined today by Dr. Ellin Saba.  Dr. Ellin Saba discussed your most recent lab work and CT scan which revealed that the spot on your pelvic wall has grown.  Dr. Ellin Saba is going to discuss this finding with Dr. Andrey Farmer.   Follow-up as scheduled.    Thank you for choosing [facility] to provide your oncology and hematology care.   To afford each patient quality time with our provider, please arrive at least 15 minutes before your scheduled appointment time. You may need to reschedule your appointment if you arrive late (10 or more minutes). Arriving late affects you and other patients whose appointments are after yours.  Also, if you miss three or more appointments without notifying the office, you may be dismissed from the clinic at the provider's discretion.    Again, thank you for choosing [facility].  Our hope is that these requests will decrease the amount of time that you wait before being seen by our physicians.   If you have a lab appointment with the Cancer Center - please note that after April 8th, all labs will be drawn in the cancer center.  You do not have to check in or register with the main entrance as you have in the past but will complete your check-in at the cancer center.            _____________________________________________________________  Should you have questions after your visit to [facility], please contact our office at [phone] and follow the prompts.  Our office hours are 8:00 a.m. to 4:30 p.m. Monday - Thursday and 8:00 a.m. to 2:30 p.m. Friday.  Please note that voicemails left after 4:00 p.m. may not be returned until the following business day.  We are closed weekends and all major holidays.  You do have access to a nurse 24-7, just call the main number to the clinic [phone] and do not press any options, hold  on the line and a nurse will answer the phone.    For prescription refill requests, have your pharmacy contact our office and allow 72 hours.    Masks are no longer required in the cancer centers. If you would like for your care team to wear a mask while they are taking care of you, please let them know. You may have one support person who is at least 75 years old accompany you for your appointments.

## 2022-11-12 NOTE — Progress Notes (Signed)
[facility] [address] [place] [address]    Clinic Day:  11/12/2022  Referring physician: Adolphus Birchwood, MD  Patient Care Team: Adolphus Birchwood, MD as PCP - General (Gynecologic Oncology) Doreatha Massed, MD as Medical Oncologist (Medical Oncology) Therese Sarah, RN as Oncology Nurse Navigator (Oncology) [name], Consuello Bossier, NP as Nurse Practitioner (Cardiothoracic Surgery)   ASSESSMENT & PLAN:   Assessment: 1. Stage IIIb high-grade serous ovarian carcinoma: - Presentation with abdominal distention and bloating sensation. - CT abdomen at outside hospital on 12/05/2020 with findings concerning for ovarian neoplasm. - CT chest without contrast on 12/18/2020 with scattered solid pulmonary nodules measuring up to 3 mm, indeterminate. - CA125 on 12/05/2020-10000 - 12/18/2020 exploratory laparotomy, bilateral salpingo-oophorectomy, infra gastric omentectomy, R0 primary tumor debulking by Dr. Andrey Farmer at [facility]. - Pathology-left ovary high-grade serous carcinoma, greatest dimension 26.5 cm, left ovarian capsule intact.  Fallopian tube surface involvement present, omentum involvement present, largest extrapelvic peritoneal focus-macroscopic, peritoneal/ascitic fluid involvement negative.  PT3BPNX.  FIGO stage IIIb. - She was evaluated for FLORA 5 clinical trial, found not to be a candidate. - Germline mutation testing was negative. - 6 cycles of carboplatin and paclitaxel from 02/11/2021 through 05/28/2021.  She has refused niraparib maintenance.    2. Social/family history: - She is seen with her daughter-in-law today.  She lives by herself.  She ambulates with the help of cane.  She retired after working at [employer].  She is non-smoker. - Brother had prostate cancer.  Another brother had throat cancer.  Sister had kidney cancer.  Grandson had bladder cancer at age 10.  Paternal uncle had head and neck cancer.    Plan: 1. Stage IIIb high-grade serous  ovarian carcinoma: - She does not have any abdominal pain or distention. - CTAP on 06/04/2022: 1.5 x 1.4 cm soft tissue nodule along the left pelvic sidewall adjacent to the left vaginal cuff which is new. - CTAP on 11/10/2022: Soft tissue nodule along the left pelvic sidewall abutting the left posterior wall of the bladder measuring 3.2 x 2.4 cm.  Small peritoneal nodule within the left upper quadrant of the abdomen is unchanged measuring 6 mm. - Reviewed labs from 11/06/2022: CA125 is 13.7.  CBC grossly normal.  LFTs are normal. - She reports that she is having hernia surgery on 11/25/2022.  She has a follow-up with Dr. Andrey Farmer on 12/08/2022. - I have recommended that she follow-up with Dr. Andrey Farmer sooner than 12/08/2022. - I will see her back in 4 to 6 weeks for follow-up.  2.  Hypomagnesemia: - Continue magnesium twice daily.  Magnesium is normal.   3.  Peripheral neuropathy: - Mild numbness in the feet is stable.  No neuropathic pains.  No orders of the defined types were placed in this encounter.     Doreatha Massed, MD   9/11/20242:50 PM  CHIEF COMPLAINT:   Diagnosis: ovarian cancer    Cancer Staging  Ovarian cancer on left [facility]) Staging form: Ovary, Fallopian Tube, and Primary Peritoneal Carcinoma, AJCC 8th Edition - Clinical stage from 01/30/2021: FIGO Stage IIIB (cT3b, cNX, cM0) - Signed by Doreatha Massed, MD on 01/30/2021    Prior Therapy: 1. Exploratory laparotomy, BSO, and infra-gastric omentectomy on 12/19/2020 with Dr. Andrey Farmer  2. Carboplatin (AUC 6) / Paclitaxel (175) q21d x 6 cycles 02/11/2021 - 05/28/2021   Current Therapy:  surveillance   HISTORY OF PRESENT ILLNESS:   Oncology History  Ovarian cancer on left [facility])  01/07/2021  Initial Diagnosis   Ovarian cancer on left [facility])   01/30/2021 Cancer Staging   Staging form: Ovary, Fallopian Tube, and Primary Peritoneal Carcinoma, AJCC 8th Edition - Clinical stage from 01/30/2021: FIGO Stage IIIB (cT3b, cNX, cM0) -  Signed by Doreatha Massed, MD on 01/30/2021 Histologic grade (G): G3 Histologic grading system: 4 grade system   02/11/2021 - 05/28/2021 Chemotherapy   Patient is on Treatment Plan : OVARIAN Carboplatin (AUC 6) / Paclitaxel (175) q21d x 6 cycles        INTERVAL HISTORY:   Miliany is a 77 y.o. female seen for follow-up of her ovarian cancer.  She denies any abdominal pains.  No cramping reported.  Appetite and energy levels are 100%.  PAST MEDICAL HISTORY:   Past Medical History: Past Medical History:  Diagnosis Date   Anemia    CAD (coronary artery disease)    DM2 (diabetes mellitus, type 2) (HCC)    GERD (gastroesophageal reflux disease)    HTN (hypertension)    Ovarian ca (HCC)    Port-A-Cath in place 02/02/2021    Surgical History: Past Surgical History:  Procedure Laterality Date   IR IMAGING GUIDED PORT INSERTION  02/08/2021    Social History: Social History   Socioeconomic History   Marital status: Single    Spouse name: Not on file   Number of children: Not on file   Years of education: Not on file   Highest education level: Not on file  Occupational History   Not on file  Tobacco Use   Smoking status: Not on file   Smokeless tobacco: Not on file  Substance and Sexual Activity   Alcohol use: Not on file   Drug use: Not on file   Sexual activity: Not on file  Other Topics Concern   Not on file  Social History Narrative   Not on file   Social Determinants of Health   Financial Resource Strain: Low Risk  (02/05/2021)   Overall Financial Resource Strain (CARDIA)    Difficulty of Paying Living Expenses: Not hard at all  Food Insecurity: No Food Insecurity (02/05/2021)   Hunger Vital Sign    Worried About Running Out of Food in the Last Year: Never true    Ran Out of Food in the Last Year: Never true  Transportation Needs: Unmet Transportation Needs (02/05/2021)   PRAPARE - Administrator, Civil Service (Medical): Yes    Lack of  Transportation (Non-Medical): No  Physical Activity: Not on file  Stress: Stress Concern Present (02/05/2021)   [employer] of Occupational Health - Occupational Stress Questionnaire    Feeling of Stress : To some extent  Social Connections: Moderately Isolated (02/05/2021)   Social Connection and Isolation Panel [NHANES]    Frequency of Communication with Friends and Family: More than three times a week    Frequency of Social Gatherings with Friends and Family: More than three times a week    Attends Religious Services: More than 4 times per year    Active Member of [employer] or Organizations: No    Attends Banker Meetings: Never    Marital Status: Widowed  Intimate Partner Violence: Not on file    Family History: History reviewed. No pertinent family history.  Current Medications:  Current Outpatient Medications:    acetaminophen (TYLENOL) 325 MG tablet, Take 3 tablets (975 mg total) by mouth every 6 (six) hours as needed for moderate pain., Disp: 360 tablet, Rfl: 2   atorvastatin (LIPITOR)  40 MG tablet, Take 40 mg by mouth at bedtime., Disp: , Rfl:    azelastine (ASTELIN) 0.1 % nasal spray, Place 1 spray into both nostrils 2 (two) times daily as needed for allergies., Disp: , Rfl:    B-D ULTRAFINE III SHORT PEN 31G X 8 MM MISC, SMARTSIG:1 Each SUB-Q Daily, Disp: , Rfl:    baclofen (LIORESAL) 10 MG tablet, Take 10 mg by mouth 3 (three) times daily as needed for muscle spasms., Disp: , Rfl:    clopidogrel (PLAVIX) 75 MG tablet, Take 75 mg by mouth daily., Disp: , Rfl:    diphenhydrAMINE-zinc acetate (BENADRYL) cream, Apply 1 application topically 3 (three) times daily as needed for itching., Disp: , Rfl:    docusate sodium (COLACE) 100 MG capsule, Take 1 capsule (100 mg total) by mouth daily as needed for mild constipation., Disp: 30 capsule, Rfl: 1   ferrous sulfate 325 (65 FE) MG tablet, Take 325 mg by mouth every evening., Disp: , Rfl:    furosemide (LASIX) 20 MG  tablet, Take 1 tablet (20 mg total) by mouth in the morning., Disp: 30 tablet, Rfl: 2   gabapentin (NEURONTIN) 300 MG capsule, Take 1 capsule (300 mg total) by mouth 3 (three) times daily., Disp: 60 capsule, Rfl: 2   HYDROcodone-acetaminophen (NORCO/VICODIN) 5-325 MG tablet, Take 1 tablet by mouth every 6 (six) hours as needed for moderate pain., Disp: 60 tablet, Rfl: 0   losartan (COZAAR) 50 MG tablet, Take 50 mg by mouth at bedtime., Disp: , Rfl:    magnesium oxide (MAG-OX) 400 (240 Mg) MG tablet, Take 1 tablet (400 mg total) by mouth 2 (two) times daily. (Patient taking differently: Take 1 tablet by mouth daily.), Disp: 180 tablet, Rfl: 3   Misc. Devices MISC, Please provide patient with 3 diabetic nutritional supplements per day., Disp: 1 each, Rfl: 11   pantoprazole (PROTONIX) 20 MG tablet, Take 20 mg by mouth daily., Disp: , Rfl:    predniSONE (DELTASONE) 50 MG tablet, Take 13hrs, 7hrs and 1hr before port placementTake 13hrs, 7hrs and 1hr before port placement, Disp: 3 tablet, Rfl: 0   SOLIQUA 100-33 UNT-MCG/ML SOPN, Inject 45 Units into the skin daily., Disp: , Rfl:    Tetrahydrozoline HCl (REDNESS RELIEVER EYE DROPS OP), Place 1 drop into both eyes daily as needed (redness)., Disp: , Rfl:    Allergies: Allergies  Allergen Reactions   Aspirin Hives   Mometasone Itching   Naproxen Sodium Itching   Penicillin G Swelling   Triamcinolone Itching   Codeine Palpitations   Ibuprofen Rash   Iodinated Contrast Media Itching and Rash    REVIEW OF SYSTEMS:   Review of Systems  Constitutional:  Negative for chills, fatigue and fever.  HENT:   Negative for lump/mass, mouth sores, nosebleeds, sore throat and trouble swallowing.   Eyes:  Negative for eye problems.  Respiratory:  Positive for cough. Negative for shortness of breath.   Cardiovascular:  Positive for leg swelling. Negative for chest pain and palpitations.  Gastrointestinal:  Positive for constipation. Negative for abdominal pain,  diarrhea, nausea and vomiting.  Genitourinary:  Negative for bladder incontinence, difficulty urinating, dysuria, frequency, hematuria and nocturia.   Musculoskeletal:  Negative for arthralgias, back pain, flank pain, myalgias and neck pain.  Skin:  Negative for itching and rash.  Neurological:  Positive for numbness. Negative for dizziness and headaches.  Hematological:  Does not bruise/bleed easily.  Psychiatric/Behavioral:  Negative for depression, sleep disturbance and suicidal ideas. The patient is  not nervous/anxious.   All other systems reviewed and are negative.    VITALS:   Blood pressure (!) 142/68, pulse (!) 107, temperature 98.3 F (36.8 C), temperature source Oral, resp. rate 18, weight 214 lb (97.1 kg), SpO2 98%.  Wt Readings from Last 3 Encounters:  11/12/22 214 lb (97.1 kg)  07/02/22 229 lb (103.9 kg)  04/01/22 215 lb 11.2 oz (97.8 kg)    Body mass index is 39.89 kg/m.  Performance status (ECOG): 1 - Symptomatic but completely ambulatory  PHYSICAL EXAM:   Physical Exam Vitals and nursing note reviewed. Exam conducted with a chaperone present.  Constitutional:      Appearance: Normal appearance.  Cardiovascular:     Rate and Rhythm: Normal rate and regular rhythm.     Pulses: Normal pulses.     Heart sounds: Normal heart sounds.  Pulmonary:     Effort: Pulmonary effort is normal.     Breath sounds: Normal breath sounds.  Abdominal:     Palpations: Abdomen is soft. There is no hepatomegaly, splenomegaly or mass.     Tenderness: There is no abdominal tenderness.  Musculoskeletal:     Right lower leg: No edema.     Left lower leg: No edema.  Lymphadenopathy:     Cervical: No cervical adenopathy.     Right cervical: No superficial, deep or posterior cervical adenopathy.    Left cervical: No superficial, deep or posterior cervical adenopathy.     Upper Body:     Right upper body: No supraclavicular or axillary adenopathy.     Left upper body: No  supraclavicular or axillary adenopathy.  Neurological:     General: No focal deficit present.     Mental Status: She is alert and oriented to person, place, and time.  Psychiatric:        Mood and Affect: Mood normal.        Behavior: Behavior normal.    LABS:      Latest Ref Rng & Units 11/06/2022    2:26 PM 06/24/2022    1:33 PM 03/25/2022    2:15 PM  CBC  WBC 4.0 - 10.5 K/uL 7.2  9.2  6.2   Hemoglobin 12.0 - 15.0 g/dL 00.9  38.1  82.9   Hematocrit 36.0 - 46.0 % 32.5  31.1  30.8   Platelets 150 - 400 K/uL 266  263  240       Latest Ref Rng & Units 11/06/2022    2:26 PM 06/24/2022    1:33 PM 03/25/2022    2:15 PM  CMP  Glucose 70 - 99 mg/dL 75  937  169   BUN 8 - 23 mg/dL 17  26  25    Creatinine 0.44 - 1.00 mg/dL 6.78  9.38  1.01   Sodium 135 - 145 mmol/L 140  136  135   Potassium 3.5 - 5.1 mmol/L 3.8  3.8  4.0   Chloride 98 - 111 mmol/L 104  101  103   CO2 22 - 32 mmol/L 25  23  23    Calcium 8.9 - 10.3 mg/dL 9.4  9.5  9.0   Total Protein 6.5 - 8.1 g/dL 7.4  7.6  7.4   Total Bilirubin 0.3 - 1.2 mg/dL 0.6  0.7  0.5   Alkaline Phos 38 - 126 U/L 74  72  65   AST 15 - 41 U/L 16  17  15    ALT 0 - 44 U/L 18  13  13  No results found for: "CEA1", "CEA" / No results found for: "CEA1", "CEA" No results found for: "PSA1" No results found for: "CAN199" Lab Results  Component Value Date   CAN125 13.7 11/06/2022    No results found for: "TOTALPROTELP", "ALBUMINELP", "A1GS", "A2GS", "BETS", "BETA2SER", "GAMS", "MSPIKE", "SPEI" Lab Results  Component Value Date   TIBC 343 11/06/2022   TIBC 365 06/24/2022   FERRITIN 301 11/06/2022   FERRITIN 186 06/24/2022   IRONPCTSAT 19 11/06/2022   IRONPCTSAT 19 06/24/2022   Lab Results  Component Value Date   LDH 119 02/11/2021     STUDIES:   CT Abdomen Pelvis W Contrast  Result Date: 11/12/2022 CLINICAL DATA:  Restaging ovarian cancer. * Tracking Code: BO * EXAM: CT ABDOMEN AND PELVIS WITH CONTRAST TECHNIQUE: Multidetector CT  imaging of the abdomen and pelvis was performed using the standard protocol following bolus administration of intravenous contrast. RADIATION DOSE REDUCTION: This exam was performed according to the departmental dose-optimization program which includes automated exposure control, adjustment of the mA and/or kV according to patient size and/or use of iterative reconstruction technique. CONTRAST:  OMNIPAQUE IOHEXOL 300 MG/ML  SOLN COMPARISON:  06/24/2022 FINDINGS: Lower chest: No acute abnormality. Hepatobiliary: Within the right lobe of liver there is a 7 mm, low-attenuation lesion which is unchanged from previous exam remaining too small to reliably characterize, image 25/2. gallbladder is not visualized suggesting prior cholecystectomy. No bile duct dilatation. Pancreas: Unremarkable. No pancreatic ductal dilatation or surrounding inflammatory changes. Spleen: Normal in size without focal abnormality. Adrenals/Urinary Tract: Normal adrenal glands. No nephrolithiasis, hydronephrosis, or suspicious mass. Bladder is unremarkable. Stomach/Bowel: Stomach is within normal limits. No evidence of bowel wall thickening, distention, or inflammatory changes. Vascular/Lymphatic: Aortic atherosclerosis. No enlarged abdominal or pelvic lymph nodes. Reproductive: Status post hysterectomy.  No adnexal mass. Other: Soft tissue nodule along the left pelvic sidewall abuts the left posterior wall of the bladder. This measures 3.2 x 2.4 cm, image 70/2. on the previous exam this measured 1.5 x 1.4 cm. Small peritoneal nodule within the left upper quadrant of the abdomen is unchanged measuring 6 mm, image 16/2. No ascites. Musculoskeletal: No acute or significant osseous findings. IMPRESSION: 1. Interval increase in size of soft tissue nodule along the left pelvic sidewall abutting the left posterior wall of the bladder. 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 2. Stable small peritoneal nodule within the left  upper quadrant of the abdomen. 3.  Aortic Atherosclerosis (ICD10-I70.0). Electronically Signed   By: Signa Kell M.D.   On: 11/12/2022 09:02

## 2022-11-17 NOTE — Progress Notes (Signed)
Called [facility] to get surgical orders for PAT appt. I was informed that they would send Dr. Derrell Lolling a message but he will be out of the office until next week.

## 2022-11-17 NOTE — Pre-Procedure Instructions (Signed)
Surgical Instructions   Your procedure is scheduled on Tuesday, September 24th. Report to [facility] Main Entrance "A" at 07:00 A.M., then check in with the Admitting office. Any questions or running late day of surgery: call [phone]  Questions prior to your surgery date: call [phone], Monday-Friday, 8am-4pm. If you experience any cold or flu symptoms such as cough, fever, chills, shortness of breath, etc. between now and your scheduled surgery, please notify us at the above number.     Remember:  Do not eat after midnight the night before your surgery   You may drink clear liquids until 06:00 AM the morning of your surgery.   Clear liquids allowed are: Water, Non-Citrus Juices (without pulp), Carbonated Beverages, Clear Tea, Black Coffee Only (NO MILK, CREAM OR POWDERED CREAMER of any kind), and Gatorade.    Take these medicines the morning of surgery with A SIP OF WATER  gabapentin (NEURONTIN)  pantoprazole (PROTONIX)  predniSONE (DELTASONE)     May take these medicines IF NEEDED: acetaminophen (TYLENOL)  azelastine (ASTELIN) nasal spray baclofen (LIORESAL)  HYDROcodone-acetaminophen (NORCO/VICODIN)  Tetrahydrozoline HCl (REDNESS RELIEVER EYE DROPS OP)    Follow your surgeon's instructions on when to stop clopidogrel (PLAVIX) .  If no instructions were given by your surgeon then you will need to call the office to get those instructions.     One week prior to surgery, STOP taking any Aspirin (unless otherwise instructed by your surgeon) Aleve, Naproxen, Ibuprofen, Motrin, Advil, Goody's, BC's, all herbal medications, fish oil, and non-prescription vitamins.  WHAT DO I DO ABOUT MY DIABETES MEDICATION?   Do not take SOLIQUA 100-33 the day before surgery (9/23) or the morning of surgery (9/24).    HOW TO MANAGE YOUR DIABETES BEFORE AND AFTER SURGERY  Why is it important to control my blood sugar before and after surgery? Improving blood sugar levels before  and after surgery helps healing and can limit problems. A way of improving blood sugar control is eating a healthy diet by:  Eating less sugar and carbohydrates  Increasing activity/exercise  Talking with your doctor about reaching your blood sugar goals High blood sugars (greater than 180 mg/dL) can raise your risk of infections and slow your recovery, so you will need to focus on controlling your diabetes during the weeks before surgery. Make sure that the doctor who takes care of your diabetes knows about your planned surgery including the date and location.  How do I manage my blood sugar before surgery? Check your blood sugar at least 4 times a day, starting 2 days before surgery, to make sure that the level is not too high or low.  Check your blood sugar the morning of your surgery when you wake up and every 2 hours until you get to the Short Stay unit.  If your blood sugar is less than 70 mg/dL, you will need to treat for low blood sugar: Do not take insulin. Treat a low blood sugar (less than 70 mg/dL) with  cup of clear juice (cranberry or apple), 4 glucose tablets, OR glucose gel. Recheck blood sugar in 15 minutes after treatment (to make sure it is greater than 70 mg/dL). If your blood sugar is not greater than 70 mg/dL on recheck, call [phone] for further instructions. Report your blood sugar to the short stay nurse when you get to Short Stay.  If you are admitted to the hospital after surgery: Your blood sugar will be checked by the staff and you will probably be  given insulin after surgery (instead of oral diabetes medicines) to make sure you have good blood sugar levels. The goal for blood sugar control after surgery is 80-180 mg/dL.                     Do NOT Smoke (Tobacco/Vaping) for 24 hours prior to your procedure.  If you use a CPAP at night, you may bring your mask/headgear for your overnight stay.   You will be asked to remove any contacts, glasses, piercing's,  hearing aid's, dentures/partials prior to surgery. Please bring cases for these items if needed.    Patients discharged the day of surgery will not be allowed to drive home, and someone needs to stay with them for 24 hours.  SURGICAL WAITING ROOM VISITATION Patients may have no more than 2 support people in the waiting area - these visitors may rotate.   Pre-op nurse will coordinate an appropriate time for 1 ADULT support person, who may not rotate, to accompany patient in pre-op.  Children under the age of 57 must have an adult with them who is not the patient and must remain in the main waiting area with an adult.  If the patient needs to stay at the hospital during part of their recovery, the visitor guidelines for inpatient rooms apply.  Please refer to the [facility] website for the visitor guidelines for any additional information.   If you received a COVID test during your pre-op visit  it is requested that you wear a mask when out in public, stay away from anyone that may not be feeling well and notify your surgeon if you develop symptoms. If you have been in contact with anyone that has tested positive in the last 10 days please notify you surgeon.      Pre-operative CHG Bathing Instructions   You can play a key role in reducing the risk of infection after surgery. Your skin needs to be as free of germs as possible. You can reduce the number of germs on your skin by washing with CHG (chlorhexidine gluconate) soap before surgery. CHG is an antiseptic soap that kills germs and continues to kill germs even after washing.   DO NOT use if you have an allergy to chlorhexidine/CHG or antibacterial soaps. If your skin becomes reddened or irritated, stop using the CHG and notify one of our RNs at [phone].              TAKE A SHOWER THE NIGHT BEFORE SURGERY AND THE DAY OF SURGERY    Please keep in mind the following:  DO NOT shave, including legs and underarms, 48 hours prior to  surgery.   You may shave your face before/day of surgery.  Place clean sheets on your bed the night before surgery Use a clean washcloth (not used since being washed) for each shower. DO NOT sleep with pet's night before surgery.  CHG Shower Instructions:  If you choose to wash your hair and private area, wash first with your normal shampoo/soap.  After you use shampoo/soap, rinse your hair and body thoroughly to remove shampoo/soap residue.  Turn the water OFF and apply half the bottle of CHG soap to a CLEAN washcloth.  Apply CHG soap ONLY FROM YOUR NECK DOWN TO YOUR TOES (washing for 3-5 minutes)  DO NOT use CHG soap on face, private areas, open wounds, or sores.  Pay special attention to the area where your surgery is being performed.  If you  are having back surgery, having someone wash your back for you may be helpful. Wait 2 minutes after CHG soap is applied, then you may rinse off the CHG soap.  Pat dry with a clean towel  Put on clean pajamas    Additional instructions for the day of surgery: DO NOT APPLY any lotions, deodorants, cologne, or perfumes.   Do not wear jewelry or makeup Do not wear nail polish, gel polish, artificial nails, or any other type of covering on natural nails (fingers and toes) Do not bring valuables to the hospital. [facility] is not responsible for valuables/personal belongings. Put on clean/comfortable clothes.  Please brush your teeth.  Ask your nurse before applying any prescription medications to the skin.

## 2022-11-18 ENCOUNTER — Inpatient Hospital Stay (HOSPITAL_COMMUNITY)
Admission: RE | Admit: 2022-11-18 | Discharge: 2022-11-18 | Disposition: A | Discharge status: Home / Self Care | Payer: Medicare Other | Source: Ambulatory Visit

## 2022-11-25 ENCOUNTER — Inpatient Hospital Stay (HOSPITAL_COMMUNITY): Admission: RE | Admit: 2022-11-25 | Payer: Medicare Other | Source: Home / Self Care | Admitting: General Surgery

## 2022-11-25 SURGERY — REPAIR, HERNIA, INCISIONAL
Anesthesia: General

## 2022-12-25 ENCOUNTER — Encounter: Payer: Self-pay | Admitting: Hematology

## 2022-12-25 ENCOUNTER — Inpatient Hospital Stay: Payer: Medicare Other | Attending: Hematology | Admitting: Hematology

## 2022-12-25 VITALS — BP 129/60 | HR 105 | Temp 97.6°F | Resp 18 | Wt 217.2 lb

## 2022-12-25 DIAGNOSIS — Z79899 Other long term (current) drug therapy: Secondary | ICD-10-CM

## 2022-12-25 DIAGNOSIS — Z8543 Personal history of malignant neoplasm of ovary: Secondary | ICD-10-CM | POA: Diagnosis present

## 2022-12-25 DIAGNOSIS — C562 Malignant neoplasm of left ovary: Secondary | ICD-10-CM

## 2022-12-25 DIAGNOSIS — Z9221 Personal history of antineoplastic chemotherapy: Secondary | ICD-10-CM | POA: Diagnosis not present

## 2022-12-25 NOTE — Progress Notes (Signed)
DISCONTINUE ON PATHWAY REGIMEN - Ovarian     A cycle is every 21 days:     Paclitaxel      Carboplatin   **Always confirm dose/schedule in your pharmacy ordering system**  REASON: Other Reason PRIOR TREATMENT: [id number]: Carboplatin AUC=6 + Paclitaxel 175 mg/m2 q21 Days x 6 Cycles For Complete Responders or 2 Cycles Past Best Response for Partial Responders Not Receiving Maintenance Therapy TREATMENT RESPONSE: Partial Response (PR)    Patient Characteristics: BRCA Mutation Status: Awaiting Test Results Therapeutic Status: Postoperative without Neoadjuvant Therapy (Pathologic Staging)

## 2022-12-25 NOTE — Progress Notes (Signed)
[facility] [address] [place] [address]    Clinic Day:  12/25/2022  Referring physician: Adolphus Birchwood, MD  Patient Care Team: Adolphus Birchwood, MD as PCP - General (Gynecologic Oncology) Doreatha Massed, MD as Medical Oncologist (Medical Oncology) Therese Sarah, RN as Oncology Nurse Navigator (Oncology) [name], Consuello Bossier, NP as Nurse Practitioner (Cardiothoracic Surgery)   ASSESSMENT & PLAN:   Assessment: 1. Stage IIIb high-grade serous ovarian carcinoma: - Presentation with abdominal distention and bloating sensation. - CT abdomen at outside hospital on 12/05/2020 with findings concerning for ovarian neoplasm. - CT chest without contrast on 12/18/2020 with scattered solid pulmonary nodules measuring up to 3 mm, indeterminate. - CA125 on 12/05/2020-10000 - 12/18/2020 exploratory laparotomy, bilateral salpingo-oophorectomy, infra gastric omentectomy, R0 primary tumor debulking by Dr. Andrey Farmer at [facility]. - Pathology-left ovary high-grade serous carcinoma, greatest dimension 26.5 cm, left ovarian capsule intact.  Fallopian tube surface involvement present, omentum involvement present, largest extrapelvic peritoneal focus-macroscopic, peritoneal/ascitic fluid involvement negative.  PT3BPNX.  FIGO stage IIIb. - She was evaluated for FLORA 5 clinical trial, found not to be a candidate. - Germline mutation testing was negative. - 6 cycles of carboplatin and paclitaxel from 02/11/2021 through 05/28/2021.  She has refused niraparib maintenance. - CTAP on 11/10/2022 showed recurrence in the left pelvic sidewall. - She met with Dr. Andrey Farmer and felt not a candidate for debulking.  She is considered platinum sensitive. - Carboplatin, Doxil started on    2. Social/family history: - She is seen with her daughter-in-law today.  She lives by herself.  She ambulates with the help of cane.  She retired after working at [employer].  She is non-smoker. - Brother had  prostate cancer.  Another brother had throat cancer.  Sister had kidney cancer.  Grandson had bladder cancer at age 27.  Paternal uncle had head and neck cancer.    Plan: 1. Stage IIIb high-grade serous ovarian carcinoma: - CTAP on 11/10/2022 reviewed by me shows progression. - She met with Dr. Andrey Farmer at [facility] who felt that she is not a candidate for debulking.  She was recommended to initiate systemic therapy with carboplatin and Doxil with bevacizumab maintenance. - We talked about chemotherapy regimen and side effects in detail. - We also talked about bevacizumab maintenance.  She was not too keen on maintenance but she wanted to complete set number of cycles of chemotherapy. - We will schedule her for baseline echocardiogram.  She already has a port.  She wants to start her treatment after the election on 01/06/2023.  2.  Hypomagnesemia: - Continue magnesium twice daily.  Magnesium is normal.   3.  Peripheral neuropathy: - Numbness in the feet is stable with no neuropathic pains.  We are avoiding paclitaxel.  Orders Placed This Encounter  Procedures   Magnesium    Standing Status:   Future    Standing Expiration Date:   01/08/2024   CBC with Differential    Standing Status:   Future    Standing Expiration Date:   01/08/2024   Comprehensive metabolic panel    Standing Status:   Future    Standing Expiration Date:   01/08/2024   Urinalysis, dipstick only    Standing Status:   Future    Standing Expiration Date:   01/08/2024   Magnesium    Standing Status:   Future    Standing Expiration Date:   02/05/2024   CBC with Differential    Standing Status:  Future    Standing Expiration Date:   02/05/2024   Comprehensive metabolic panel    Standing Status:   Future    Standing Expiration Date:   02/05/2024   Magnesium    Standing Status:   Future    Standing Expiration Date:   03/04/2024   CBC with Differential    Standing Status:   Future    Standing Expiration Date:   03/04/2024    Comprehensive metabolic panel    Standing Status:   Future    Standing Expiration Date:   03/04/2024   Urinalysis, dipstick only    Standing Status:   Future    Standing Expiration Date:   03/04/2024   ECHOCARDIOGRAM COMPLETE    Standing Status:   Future    Standing Expiration Date:   12/25/2023    Order Specific Question:   Where should this test be performed    Answer:   [facility]    Order Specific Question:   Perflutren DEFINITY (image enhancing agent) should be administered unless hypersensitivity or allergy exist    Answer:   Administer Perflutren    Order Specific Question:   Reason for exam-Echo    Answer:   Chemo  Z09     I,Helena R Teague,acting as a scribe for Doreatha Massed, MD.,have documented all relevant documentation on the behalf of Doreatha Massed, MD,as directed by  Doreatha Massed, MD while in the presence of Doreatha Massed, MD.  I, Doreatha Massed MD, have reviewed the above documentation for accuracy and completeness, and I agree with the above.     Doreatha Massed, MD   10/24/20246:05 PM  CHIEF COMPLAINT:   Diagnosis: ovarian cancer    Cancer Staging  Ovarian cancer on left [facility]) Staging form: Ovary, Fallopian Tube, and Primary Peritoneal Carcinoma, AJCC 8th Edition - Clinical stage from 01/30/2021: FIGO Stage IIIB (cT3b, cNX, cM0) - Signed by Doreatha Massed, MD on 01/30/2021    Prior Therapy: 1. Exploratory laparotomy, BSO, and infra-gastric omentectomy on 12/19/2020 with Dr. Andrey Farmer  2. Carboplatin (AUC 6) / Paclitaxel (175) q21d x 6 cycles 02/11/2021 - 05/28/2021   Current Therapy:  surveillance   HISTORY OF PRESENT ILLNESS:   Oncology History  Ovarian cancer on left [facility])  01/07/2021 Initial Diagnosis   Ovarian cancer on left [facility])   01/30/2021 Cancer Staging   Staging form: Ovary, Fallopian Tube, and Primary Peritoneal Carcinoma, AJCC 8th Edition - Clinical stage from 01/30/2021: FIGO Stage IIIB (cT3b, cNX, cM0)  - Signed by Doreatha Massed, MD on 01/30/2021 Histologic grade (G): G3 Histologic grading system: 4 grade system   02/11/2021 - 05/28/2021 Chemotherapy   Patient is on Treatment Plan : OVARIAN Carboplatin (AUC 6) / Paclitaxel (175) q21d x 6 cycles     01/08/2023 -  Chemotherapy   Patient is on Treatment Plan : OVARIAN Liposomal Doxorubicin D1 + Carboplatin D1 + Bevacizumab D1,15 q28d / Bevacizumab q21d        INTERVAL HISTORY:   Rebecca [name] is a 77 y.o. female seen for follow-up of her ovarian cancer. She was last seen by me on 11/12/22.  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   Today, she states that she is doing well overall. Her appetite level is at 75%. Her energy level is at 75%. She was seen by Dr. Andrey Farmer on 11/24/22 and reportedly said she would start a new chemotherapy. She would like to start new treatment on or after 01/06/23.  PAST MEDICAL HISTORY:   Past Medical History: Past Medical History:  Diagnosis Date   Anemia    CAD (coronary artery disease)    DM2 (diabetes mellitus, type 2) (HCC)    GERD (gastroesophageal reflux disease)    HTN (hypertension)    Ovarian ca (HCC)    Port-A-Cath in place 02/02/2021    Surgical History: Past Surgical History:  Procedure Laterality Date   IR IMAGING GUIDED PORT INSERTION  02/08/2021    Social History: Social History   Socioeconomic History   Marital status: Single    Spouse name: Not on file   Number of children: Not on file   Years of education: Not on file   Highest education level: Not on file  Occupational History   Not on file  Tobacco Use   Smoking status: Not on file   Smokeless tobacco: Not on file  Substance and Sexual Activity   Alcohol use: Not on file   Drug use: Not on file   Sexual activity: Not on file  Other Topics Concern   Not on file  Social History Narrative   Not on file   Social Determinants of Health   Financial Resource Strain: Low Risk   (02/05/2021)   Overall Financial Resource Strain (CARDIA)    Difficulty of Paying Living Expenses: Not hard at all  Food Insecurity: No Food Insecurity (02/05/2021)   Hunger Vital Sign    Worried About Running Out of Food in the Last Year: Never true    Ran Out of Food in the Last Year: Never true  Transportation Needs: Unmet Transportation Needs (02/05/2021)   PRAPARE - Administrator, Civil Service (Medical): Yes    Lack of Transportation (Non-Medical): No  Physical Activity: Not on file  Stress: Stress Concern Present (02/05/2021)   [employer] of Occupational Health - Occupational Stress Questionnaire    Feeling of Stress : To some extent  Social Connections: Moderately Isolated (02/05/2021)   Social Connection and Isolation Panel [NHANES]    Frequency of Communication with Friends and Family: More than three times a week    Frequency of Social Gatherings with Friends and Family: More than three times a week    Attends Religious Services: More than 4 times per year    Active Member of [employer] or Organizations: No    Attends Banker Meetings: Never    Marital Status: Widowed  Intimate Partner Violence: Not on file    Family History: History reviewed. No pertinent family history.  Current Medications:  Current Outpatient Medications:    acetaminophen (TYLENOL) 325 MG tablet, Take 3 tablets (975 mg total) by mouth every 6 (six) hours as needed for moderate pain., Disp: 360 tablet, Rfl: 2   atorvastatin (LIPITOR) 40 MG tablet, Take 40 mg by mouth at bedtime., Disp: , Rfl:    azelastine (ASTELIN) 0.1 % nasal spray, Place 1 spray into both nostrils 2 (two) times daily as needed for allergies., Disp: , Rfl:    B-D ULTRAFINE III SHORT PEN 31G X 8 MM MISC, SMARTSIG:1 Each SUB-Q Daily, Disp: , Rfl:    baclofen (LIORESAL) 10 MG tablet, Take 10 mg by mouth 3 (three) times daily as needed for muscle spasms., Disp: , Rfl:    clopidogrel (PLAVIX) 75 MG tablet, Take 75  mg by mouth daily., Disp: , Rfl:    diphenhydrAMINE-zinc acetate (BENADRYL) cream, Apply 1 application topically 3 (three) times daily as needed for itching., Disp: , Rfl:  docusate sodium (COLACE) 100 MG capsule, Take 1 capsule (100 mg total) by mouth daily as needed for mild constipation., Disp: 30 capsule, Rfl: 1   ferrous sulfate 325 (65 FE) MG tablet, Take 325 mg by mouth every evening., Disp: , Rfl:    furosemide (LASIX) 20 MG tablet, Take 1 tablet (20 mg total) by mouth in the morning., Disp: 30 tablet, Rfl: 2   gabapentin (NEURONTIN) 300 MG capsule, Take 1 capsule (300 mg total) by mouth 3 (three) times daily., Disp: 60 capsule, Rfl: 2   HYDROcodone-acetaminophen (NORCO/VICODIN) 5-325 MG tablet, Take 1 tablet by mouth every 6 (six) hours as needed for moderate pain., Disp: 60 tablet, Rfl: 0   losartan (COZAAR) 50 MG tablet, Take 50 mg by mouth at bedtime., Disp: , Rfl:    magnesium oxide (MAG-OX) 400 (240 Mg) MG tablet, Take 1 tablet (400 mg total) by mouth 2 (two) times daily. (Patient taking differently: Take 1 tablet by mouth daily.), Disp: 180 tablet, Rfl: 3   Misc. Devices MISC, Please provide patient with 3 diabetic nutritional supplements per day., Disp: 1 each, Rfl: 11   pantoprazole (PROTONIX) 20 MG tablet, Take 20 mg by mouth daily., Disp: , Rfl:    predniSONE (DELTASONE) 50 MG tablet, Take 13hrs, 7hrs and 1hr before port placementTake 13hrs, 7hrs and 1hr before port placement, Disp: 3 tablet, Rfl: 0   SOLIQUA 100-33 UNT-MCG/ML SOPN, Inject 45 Units into the skin daily., Disp: , Rfl:    Tetrahydrozoline HCl (REDNESS RELIEVER EYE DROPS OP), Place 1 drop into both eyes daily as needed (redness)., Disp: , Rfl:    Allergies: Allergies  Allergen Reactions   Aspirin Hives   Mometasone Itching   Naproxen Sodium Itching   Penicillin G Swelling   Triamcinolone Itching   Codeine Palpitations   Ibuprofen Rash   Iodinated Contrast Media Itching and Rash    REVIEW OF SYSTEMS:    Review of Systems  Constitutional:  Negative for chills, fatigue and fever.  HENT:   Negative for lump/mass, mouth sores, nosebleeds, sore throat and trouble swallowing.   Eyes:  Negative for eye problems.  Respiratory:  Positive for cough and shortness of breath.   Cardiovascular:  Negative for chest pain, leg swelling and palpitations.  Gastrointestinal:  Positive for abdominal pain (RUQ, 8/10 severity) and constipation. Negative for diarrhea, nausea and vomiting.  Genitourinary:  Negative for bladder incontinence, difficulty urinating, dysuria, frequency, hematuria and nocturia.   Musculoskeletal:  Negative for arthralgias, back pain, flank pain, myalgias and neck pain.  Skin:  Negative for itching and rash.  Neurological:  Positive for headaches and numbness (in hands and feet). Negative for dizziness.  Hematological:  Does not bruise/bleed easily.  Psychiatric/Behavioral:  Negative for depression, sleep disturbance and suicidal ideas. The patient is not nervous/anxious.   All other systems reviewed and are negative.    VITALS:   Blood pressure 129/60, pulse (!) 105, temperature 97.6 F (36.4 C), resp. rate 18, weight 217 lb 3.2 oz (98.5 kg), SpO2 96%.  Wt Readings from Last 3 Encounters:  12/25/22 217 lb 3.2 oz (98.5 kg)  11/12/22 214 lb (97.1 kg)  07/02/22 229 lb (103.9 kg)    Body mass index is 40.48 kg/m.  Performance status (ECOG): 1 - Symptomatic but completely ambulatory  PHYSICAL EXAM:   Physical Exam Vitals and nursing note reviewed. Exam conducted with a chaperone present.  Constitutional:      Appearance: Normal appearance.  Cardiovascular:     Rate and  Rhythm: Normal rate and regular rhythm.     Pulses: Normal pulses.     Heart sounds: Normal heart sounds.  Pulmonary:     Effort: Pulmonary effort is normal.     Breath sounds: Normal breath sounds.  Abdominal:     Palpations: Abdomen is soft. There is no hepatomegaly, splenomegaly or mass.      Tenderness: There is no abdominal tenderness.  Musculoskeletal:     Right lower leg: No edema.     Left lower leg: No edema.  Lymphadenopathy:     Cervical: No cervical adenopathy.     Right cervical: No superficial, deep or posterior cervical adenopathy.    Left cervical: No superficial, deep or posterior cervical adenopathy.     Upper Body:     Right upper body: No supraclavicular or axillary adenopathy.     Left upper body: No supraclavicular or axillary adenopathy.  Neurological:     General: No focal deficit present.     Mental Status: She is alert and oriented to person, place, and time.  Psychiatric:        Mood and Affect: Mood normal.        Behavior: Behavior normal.     LABS:      Latest Ref Rng & Units 11/06/2022    2:26 PM 06/24/2022    1:33 PM 03/25/2022    2:15 PM  CBC  WBC 4.0 - 10.5 K/uL 7.2  9.2  6.2   Hemoglobin 12.0 - 15.0 g/dL 16.1  09.6  04.5   Hematocrit 36.0 - 46.0 % 32.5  31.1  30.8   Platelets 150 - 400 K/uL 266  263  240       Latest Ref Rng & Units 11/06/2022    2:26 PM 06/24/2022    1:33 PM 03/25/2022    2:15 PM  CMP  Glucose 70 - 99 mg/dL 75  409  811   BUN 8 - 23 mg/dL 17  26  25    Creatinine 0.44 - 1.00 mg/dL 9.14  7.82  9.56   Sodium 135 - 145 mmol/L 140  136  135   Potassium 3.5 - 5.1 mmol/L 3.8  3.8  4.0   Chloride 98 - 111 mmol/L 104  101  103   CO2 22 - 32 mmol/L 25  23  23    Calcium 8.9 - 10.3 mg/dL 9.4  9.5  9.0   Total Protein 6.5 - 8.1 g/dL 7.4  7.6  7.4   Total Bilirubin 0.3 - 1.2 mg/dL 0.6  0.7  0.5   Alkaline Phos 38 - 126 U/L 74  72  65   AST 15 - 41 U/L 16  17  15    ALT 0 - 44 U/L 18  13  13       No results found for: "CEA1", "CEA" / No results found for: "CEA1", "CEA" No results found for: "PSA1" No results found for: "CAN199" Lab Results  Component Value Date   CAN125 13.7 11/06/2022    No results found for: "TOTALPROTELP", "ALBUMINELP", "A1GS", "A2GS", "BETS", "BETA2SER", "GAMS", "MSPIKE", "SPEI" Lab Results   Component Value Date   TIBC 343 11/06/2022   TIBC 365 06/24/2022   FERRITIN 301 11/06/2022   FERRITIN 186 06/24/2022   IRONPCTSAT 19 11/06/2022   IRONPCTSAT 19 06/24/2022   Lab Results  Component Value Date   LDH 119 02/11/2021     STUDIES:   No results found.

## 2022-12-25 NOTE — Patient Instructions (Addendum)
[facility] at [facility] Discharge Instructions   You were seen and examined today by Dr. Ellin Saba.  He reviewed the results of your lab work which are normal/stable.   We will plan to start you on treatment with a new chemotherapy regimen with two drugs. Those drugs are called Doxil and carboplatin. They are given in the clinic every 4 weeks.   We will plan to start your treatment the week of November 11th.   Return as scheduled.    Thank you for choosing [facility] at [facility] to provide your oncology and hematology care.  To afford each patient quality time with our provider, please arrive at least 15 minutes before your scheduled appointment time.   If you have a lab appointment with the Cancer Center please come in thru the Main Entrance and check in at the main information desk.  You need to re-schedule your appointment should you arrive 10 or more minutes late.  We strive to give you quality time with our providers, and arriving late affects you and other patients whose appointments are after yours.  Also, if you no show three or more times for appointments you may be dismissed from the clinic at the providers discretion.     Again, thank you for choosing [facility].  Our hope is that these requests will decrease the amount of time that you wait before being seen by our physicians.       _____________________________________________________________  Should you have questions after your visit to [facility], please contact our office at [phone] and follow the prompts.  Our office hours are 8:00 a.m. and 4:30 p.m. Monday - Friday.  Please note that voicemails left after 4:00 p.m. may not be returned until the following business day.  We are closed weekends and major holidays.  You do have access to a nurse 24-7, just call the main number to the clinic [phone] and do not press any options, hold on  the line and a nurse will answer the phone.    For prescription refill requests, have your pharmacy contact our office and allow 72 hours.    Due to Covid, you will need to wear a mask upon entering the hospital. If you do not have a mask, a mask will be given to you at the Main Entrance upon arrival. For doctor visits, patients may have 1 support person age 57 or older with them. For treatment visits, patients can not have anyone with them due to social distancing guidelines and our immunocompromised population.

## 2022-12-25 NOTE — Progress Notes (Signed)
START ON PATHWAY REGIMEN - Ovarian     A cycle is every 28 days:     Carboplatin      Liposomal doxorubicin   **Always confirm dose/schedule in your pharmacy ordering system**  Patient Characteristics: Recurrent or Progressive Disease, Second Line, Platinum Sensitive and ? 6 Months Since Last Therapy, Not a Candidate for Secondary Debulking Surgery BRCA Mutation Status: Absent Therapeutic Status: Recurrent or Progressive Disease Line of Therapy: Second Line  Intent of Therapy: Curative Intent, Discussed with Patient

## 2022-12-27 ENCOUNTER — Other Ambulatory Visit: Payer: Self-pay

## 2023-01-01 ENCOUNTER — Other Ambulatory Visit: Payer: Self-pay | Admitting: Hematology

## 2023-01-02 ENCOUNTER — Ambulatory Visit (HOSPITAL_COMMUNITY): Payer: Medicare Other

## 2023-01-06 ENCOUNTER — Other Ambulatory Visit: Payer: Self-pay

## 2023-01-07 ENCOUNTER — Ambulatory Visit (HOSPITAL_COMMUNITY)
Admission: RE | Admit: 2023-01-07 | Discharge: 2023-01-07 | Disposition: A | Discharge status: Home / Self Care | Payer: Medicare Other | Source: Ambulatory Visit | Attending: Hematology | Admitting: Hematology

## 2023-01-07 DIAGNOSIS — C562 Malignant neoplasm of left ovary: Secondary | ICD-10-CM | POA: Insufficient documentation

## 2023-01-07 DIAGNOSIS — I2510 Atherosclerotic heart disease of native coronary artery without angina pectoris: Secondary | ICD-10-CM | POA: Diagnosis not present

## 2023-01-07 DIAGNOSIS — I10 Essential (primary) hypertension: Secondary | ICD-10-CM | POA: Insufficient documentation

## 2023-01-07 DIAGNOSIS — E119 Type 2 diabetes mellitus without complications: Secondary | ICD-10-CM | POA: Insufficient documentation

## 2023-01-07 DIAGNOSIS — I3481 Nonrheumatic mitral (valve) annulus calcification: Secondary | ICD-10-CM | POA: Diagnosis not present

## 2023-01-07 DIAGNOSIS — Z79899 Other long term (current) drug therapy: Secondary | ICD-10-CM | POA: Insufficient documentation

## 2023-01-07 LAB — ECHOCARDIOGRAM COMPLETE
Area-P 1/2: 3.99 cm2
Calc EF: 61.9 %
S' Lateral: 3.5 cm
Single Plane A2C EF: 59.7 %
Single Plane A4C EF: 63.3 %

## 2023-01-08 ENCOUNTER — Other Ambulatory Visit: Payer: Self-pay

## 2023-01-12 ENCOUNTER — Inpatient Hospital Stay: Payer: Medicare Other

## 2023-01-12 ENCOUNTER — Inpatient Hospital Stay: Payer: Medicare Other | Attending: Hematology

## 2023-01-12 VITALS — BP 122/55 | HR 94 | Temp 97.8°F | Resp 18

## 2023-01-12 DIAGNOSIS — C562 Malignant neoplasm of left ovary: Secondary | ICD-10-CM | POA: Diagnosis present

## 2023-01-12 DIAGNOSIS — Z5111 Encounter for antineoplastic chemotherapy: Secondary | ICD-10-CM | POA: Diagnosis present

## 2023-01-12 DIAGNOSIS — Z79899 Other long term (current) drug therapy: Secondary | ICD-10-CM | POA: Diagnosis not present

## 2023-01-12 DIAGNOSIS — Z8543 Personal history of malignant neoplasm of ovary: Secondary | ICD-10-CM | POA: Insufficient documentation

## 2023-01-12 DIAGNOSIS — L299 Pruritus, unspecified: Secondary | ICD-10-CM

## 2023-01-12 LAB — URINALYSIS, DIPSTICK ONLY
Bilirubin Urine: NEGATIVE
Glucose, UA: NEGATIVE mg/dL
Hgb urine dipstick: NEGATIVE
Ketones, ur: NEGATIVE mg/dL
Nitrite: POSITIVE — AB
Protein, ur: NEGATIVE mg/dL
Specific Gravity, Urine: 1.011 (ref 1.005–1.030)
pH: 6 (ref 5.0–8.0)

## 2023-01-12 LAB — CBC WITH DIFFERENTIAL/PLATELET
Abs Immature Granulocytes: 0.03 10*3/uL (ref 0.00–0.07)
Basophils Absolute: 0 10*3/uL (ref 0.0–0.1)
Basophils Relative: 1 %
Eosinophils Absolute: 0.2 10*3/uL (ref 0.0–0.5)
Eosinophils Relative: 2 %
HCT: 33.1 % — ABNORMAL LOW (ref 36.0–46.0)
Hemoglobin: 11 g/dL — ABNORMAL LOW (ref 12.0–15.0)
Immature Granulocytes: 0 %
Lymphocytes Relative: 25 %
Lymphs Abs: 1.9 10*3/uL (ref 0.7–4.0)
MCH: 30.4 pg (ref 26.0–34.0)
MCHC: 33.2 g/dL (ref 30.0–36.0)
MCV: 91.4 fL (ref 80.0–100.0)
Monocytes Absolute: 0.7 10*3/uL (ref 0.1–1.0)
Monocytes Relative: 9 %
Neutro Abs: 4.8 10*3/uL (ref 1.7–7.7)
Neutrophils Relative %: 63 %
Platelets: 283 10*3/uL (ref 150–400)
RBC: 3.62 MIL/uL — ABNORMAL LOW (ref 3.87–5.11)
RDW: 12.8 % (ref 11.5–15.5)
WBC: 7.6 10*3/uL (ref 4.0–10.5)
nRBC: 0 % (ref 0.0–0.2)

## 2023-01-12 LAB — URINALYSIS, ROUTINE W REFLEX MICROSCOPIC
Bilirubin Urine: NEGATIVE
Glucose, UA: NEGATIVE mg/dL
Hgb urine dipstick: NEGATIVE
Ketones, ur: NEGATIVE mg/dL
Nitrite: POSITIVE — AB
Protein, ur: NEGATIVE mg/dL
Specific Gravity, Urine: 1.011 (ref 1.005–1.030)
pH: 6 (ref 5.0–8.0)

## 2023-01-12 LAB — COMPREHENSIVE METABOLIC PANEL
ALT: 17 U/L (ref 0–44)
AST: 14 U/L — ABNORMAL LOW (ref 15–41)
Albumin: 3.8 g/dL (ref 3.5–5.0)
Alkaline Phosphatase: 95 U/L (ref 38–126)
Anion gap: 8 (ref 5–15)
BUN: 15 mg/dL (ref 8–23)
CO2: 27 mmol/L (ref 22–32)
Calcium: 9 mg/dL (ref 8.9–10.3)
Chloride: 100 mmol/L (ref 98–111)
Creatinine, Ser: 1.12 mg/dL — ABNORMAL HIGH (ref 0.44–1.00)
GFR, Estimated: 51 mL/min — ABNORMAL LOW (ref >60)
Glucose, Bld: 164 mg/dL — ABNORMAL HIGH (ref 70–99)
Potassium: 3.5 mmol/L (ref 3.5–5.1)
Sodium: 135 mmol/L (ref 135–145)
Total Bilirubin: 0.5 mg/dL (ref <1.2)
Total Protein: 7.2 g/dL (ref 6.5–8.1)

## 2023-01-12 LAB — MAGNESIUM: Magnesium: 1.7 mg/dL (ref 1.7–2.4)

## 2023-01-12 MED ORDER — SODIUM CHLORIDE 0.9 % IV SOLN
INTRAVENOUS | Status: DC
Start: 2023-01-12 — End: 2023-01-12

## 2023-01-12 MED ORDER — DEXAMETHASONE SODIUM PHOSPHATE 10 MG/ML IJ SOLN
10.0000 mg | Freq: Once | INTRAMUSCULAR | Status: AC
Start: 2023-01-12 — End: 2023-01-12
  Administered 2023-01-12: 10 mg via INTRAVENOUS
  Filled 2023-01-12: qty 1

## 2023-01-12 MED ORDER — SODIUM CHLORIDE 0.9 % IV SOLN: 10.0000 mg | Freq: Once | INTRAVENOUS | Status: DC

## 2023-01-12 MED ORDER — CARBOPLATIN CHEMO INJECTION 600 MG/60ML
502.5000 mg | Freq: Once | INTRAVENOUS | Status: AC
  Administered 2023-01-12: 500 mg via INTRAVENOUS
  Filled 2023-01-12: qty 50

## 2023-01-12 MED ORDER — FAMOTIDINE IN NACL 20-0.9 MG/50ML-% IV SOLN
20.0000 mg | Freq: Once | INTRAVENOUS | Status: AC
  Administered 2023-01-12: 20 mg via INTRAVENOUS
  Filled 2023-01-12: qty 50

## 2023-01-12 MED ORDER — HEPARIN SOD (PORK) LOCK FLUSH 100 UNIT/ML IV SOLN
500.0000 [IU] | Freq: Once | INTRAVENOUS | Status: AC | PRN
  Administered 2023-01-12: 500 [IU]

## 2023-01-12 MED ORDER — SODIUM CHLORIDE 0.9% FLUSH
10.0000 mL | INTRAVENOUS | Status: DC | PRN
  Administered 2023-01-12: 10 mL

## 2023-01-12 MED ORDER — DEXTROSE 5 % IV SOLN
INTRAVENOUS | Status: DC
Start: 2023-01-12 — End: 2023-01-12

## 2023-01-12 MED ORDER — LIDOCAINE-PRILOCAINE 2.5-2.5 % EX CREA: TOPICAL_CREAM | CUTANEOUS | 3 refills | Status: AC

## 2023-01-12 MED ORDER — SODIUM CHLORIDE 0.9% FLUSH
10.0000 mL | Freq: Once | INTRAVENOUS | Status: AC
  Administered 2023-01-12: 10 mL via INTRAVENOUS

## 2023-01-12 MED ORDER — FOSAPREPITANT DIMEGLUMINE INJECTION 150 MG
150.0000 mg | Freq: Once | INTRAVENOUS | Status: AC
  Administered 2023-01-12: 150 mg via INTRAVENOUS
  Filled 2023-01-12: qty 150

## 2023-01-12 MED ORDER — DEXTROSE 5 % IV SOLN
30.0000 mg/m2 | Freq: Once | INTRAVENOUS | Status: AC
  Administered 2023-01-12: 60 mg via INTRAVENOUS
  Filled 2023-01-12: qty 30

## 2023-01-12 MED ORDER — PROCHLORPERAZINE MALEATE 10 MG PO TABS: 10.0000 mg | ORAL_TABLET | Freq: Four times a day (QID) | ORAL | 3 refills | Status: DC | PRN

## 2023-01-12 MED ORDER — PALONOSETRON HCL INJECTION 0.25 MG/5ML
0.2500 mg | Freq: Once | INTRAVENOUS | Status: AC
  Administered 2023-01-12: 0.25 mg via INTRAVENOUS
  Filled 2023-01-12: qty 5

## 2023-01-12 MED ORDER — CETIRIZINE HCL 10 MG/ML IV SOLN
10.0000 mg | Freq: Once | INTRAVENOUS | Status: AC
  Administered 2023-01-12: 10 mg via INTRAVENOUS
  Filled 2023-01-12: qty 1

## 2023-01-12 NOTE — Progress Notes (Signed)
Patient stated she was "itching" with urination.  Denied burning or dysuria.  Urine collected and oncologist notified.    Teaching packet given with all questions asked and answered.  Consent signed.   Patient tolerated chemotherapy with no complaints voiced.  Side effects with management reviewed with understanding verbalized.  Port site clean and dry with no bruising or swelling noted at site.  Good blood return noted before and after administration of chemotherapy.  Band aid applied.  Patient left in satisfactory condition with VSS and no s/s of distress noted.

## 2023-01-12 NOTE — Patient Instructions (Signed)
[facility] Chemotherapy Teaching   You will see the doctor regularly throughout treatment.  We will obtain blood work from you prior to every treatment and monitor your results to make sure it is safe to give your treatment. The doctor monitors your response to treatment by the way you are feeling, your blood work, and by obtaining scans periodically.  There will be wait times while you are here for treatment.  It will take about 30 minutes to 1 hour for your lab work to result.  Then there will be wait times while pharmacy mixes your medications.   Medications you will receive in the clinic prior to your chemotherapy medications:  Aloxi:  ALOXI is used in adults to help prevent nausea and vomiting that happens with certain chemotherapy drugs.  Aloxi is a long acting medication, and will remain in your system for about two days.   Emend:  This is an anti-nausea medication that is used with Aloxi to help prevent nausea and vomiting caused by chemotherapy.  Dexamethasone:  This is a steroid given prior to chemotherapy to help prevent allergic reactions; it may also help prevent and control nausea and diarrhea.   Carboplatin (Paraplatin, CBDCA)  About This Drug  Carboplatin is used to treat cancer. It is given in the vein (IV).  It will take 30 minutes to infuse.   Possible Side Effects   Bone marrow suppression. This is a decrease in the number of white blood cells, red blood cells, and platelets. This may raise your risk of infection, make you tired and weak (fatigue), and raise your risk of bleeding.   Nausea and vomiting (throwing up)   Weakness   Changes in your liver function   Changes in your kidney function   Electrolyte changes   Pain  Note: Each of the side effects above was reported in 20% or greater of patients treated with carboplatin. Not all possible side effects are included above.   Warnings and Precautions   Severe bone marrow suppression    Allergic reactions, including anaphylaxis are rare but may happen in some patients. Signs of allergic reaction to this drug may be swelling of the face, feeling like your tongue or throat are swelling, trouble breathing, rash, itching, fever, chills, feeling dizzy, and/or feeling that your heart is beating in a fast or not normal way. If this happens, do not take another dose of this drug. You should get urgent medical treatment.   Severe nausea and vomiting   Effects on the nerves are called peripheral neuropathy. This risk is increased if you are over the age of 8 or if you have received other medicine with risk of peripheral neuropathy. You may feel numbness, tingling, or pain in your hands and feet. It may be hard for you to button your clothes, open jars, or walk as usual. The effect on the nerves may get worse with more doses of the drug. These effects get better in some people after the drug is stopped but it does not get better in all people.   Blurred vision, loss of vision or other changes in eyesight   Decreased hearing   - Skin and tissue irritation including redness, pain, warmth, or swelling at the IV site if the drug leaks out of the vein and into nearby tissue.   Severe changes in your kidney function, which can cause kidney failure   Severe changes in your liver function, which can cause liver failure  Note: Some  of the side effects above are very rare. If you have concerns and/or questions, please discuss them with your medical team.   Important Information   This drug may be present in the saliva, tears, sweat, urine, stool, vomit, semen, and vaginal secretions. Talk to your doctor and/or your nurse about the necessary precautions to take during this time.   Treating Side Effects   Manage tiredness by pacing your activities for the day.   Be sure to include periods of rest between energy-draining activities.   To decrease the risk of infection, wash your hands  regularly.   Avoid close contact with people who have a cold, the flu, or other infections.   Take your temperature as your doctor or nurse tells you, and whenever you feel like you may have a fever.   To help decrease the risk of bleeding, use a soft toothbrush. Check with your nurse before using dental floss.   Be very careful when using knives or tools.   Use an electric shaver instead of a razor.   Drink plenty of fluids (a minimum of eight glasses per day is recommended).   If you throw up or have loose bowel movements, you should drink more fluids so that you do not become dehydrated (lack of water in the body from losing too much fluid).   To help with nausea and vomiting, eat small, frequent meals instead of three large meals a day. Choose foods and drinks that are at room temperature. Ask your nurse or doctor about other helpful tips and medicine that is available to help stop or lessen these symptoms.   If you have numbness and tingling in your hands and feet, be careful when cooking, walking, and handling sharp objects and hot liquids.   Keeping your pain under control is important to your well-being. Please tell your doctor or nurse if you are experiencing pain.   Food and Drug Interactions   There are no known interactions of carboplatin with food.   This drug may interact with other medicines. Tell your doctor and pharmacist about all the prescription and over-the-counter medicines and dietary supplements (vitamins, minerals, herbs and others) that you are taking at this time. Also, check with your doctor or pharmacist before starting any new prescription or over-the-counter medicines, or dietary supplements to make sure that there are no interactions.   When to Call the Doctor  Call your doctor or nurse if you have any of these symptoms and/or any new or unusual symptoms:   Fever of 100.4 F (38 C) or higher   Chills   Tiredness that interferes with your daily  activities   Feeling dizzy or lightheaded   Easy bleeding or bruising   Nausea that stops you from eating or drinking and/or is not relieved by prescribed medicines   Throwing up   Blurred vision or other changes in eyesight   Decrease in hearing or ringing in the ear   Signs of allergic reaction: swelling of the face, feeling like your tongue or throat are swelling, trouble breathing, rash, itching, fever, chills, feeling dizzy, and/or feeling that your heart is beating in a fast or not normal way. If this happens, call 911 for emergency care.   Signs of possible liver problems: dark urine, pale bowel movements, bad stomach pain, feeling very tired and weak, unusual itching, or yellowing of the eyes or skin   Decreased urine, or very dark urine   Numbness, tingling, or pain in your hands  and feet   Pain that does not go away or is not relieved by prescribed medicine   While you are getting this drug, please tell your nurse right away if you have any pain, redness, or swelling at the site of the IV infusion, or if you have any new onset of symptoms, or if you just feel "different" from before when the infusion was started.   Reproduction Warnings   Pregnancy warning: This drug may have harmful effects on the unborn baby. Women of child bearing potential should use effective methods of birth control during your cancer treatment. Let your doctor know right away if you think you may be pregnant.   Breastfeeding warning: It is not known if this drug passes into breast milk. For this reason, women should not breastfeed during treatment because this drug could enter the breast milk and cause harm to a breastfeeding baby.   Fertility warning: Human fertility studies have not been done with this drug. Talk with your doctor or nurse if you plan to have children. Ask for information on sperm or egg banking.   SELF CARE ACTIVITIES WHILE RECEIVING CHEMOTHERAPY:  Hydration Increase your  fluid intake 48 hours prior to treatment and drink at least 8 to 12 cups (64 ounces) of water/decaffeinated beverages per day after treatment. You can still have your cup of coffee or soda but these beverages do not count as part of your 8 to 12 cups that you need to drink daily. No alcohol intake.  Medications Continue taking your normal prescription medication as prescribed.  If you start any new herbal or new supplements please let us know first to make sure it is safe.  Mouth Care Have teeth cleaned professionally before starting treatment. Keep dentures and partial plates clean. Use soft toothbrush and do not use mouthwashes that contain alcohol. Biotene is a good mouthwash that is available at most pharmacies or may be ordered by calling [phone]. Use warm salt water gargles (1 teaspoon salt per 1 quart warm water) before and after meals and at bedtime. If you need dental work, please let the doctor know before you go for your appointment so that we can coordinate the best possible time for you in regards to your chemo regimen. You need to also let your dentist know that you are actively taking chemo. We may need to do labs prior to your dental appointment.  Skin Care Always use sunscreen that has not expired and with SPF (Sun Protection Factor) of 50 or higher. Wear hats to protect your head from the sun. Remember to use sunscreen on your hands, ears, face, & feet.  Use good moisturizing lotions such as udder cream, eucerin, or even Vaseline. Some chemotherapies can cause dry skin, color changes in your skin and nails.    Avoid long, hot showers or baths. Use gentle, fragrance-free soaps and laundry detergent. Use moisturizers, preferably creams or ointments rather than lotions because the thicker consistency is better at preventing skin dehydration. Apply the cream or ointment within 15 minutes of showering. Reapply moisturizer at night, and moisturize your hands every time after you wash  them.  Hair Loss (if your doctor says your hair will fall out)  If your doctor says that your hair is likely to fall out, decide before you begin chemo whether you want to wear a wig. You may want to shop before treatment to match your hair color. Hats, turbans, and scarves can also camouflage hair loss, although some people  prefer to leave their heads uncovered. If you go bare-headed outdoors, be sure to use sunscreen on your scalp. Cut your hair short. It eases the inconvenience of shedding lots of hair, but it also can reduce the emotional impact of watching your hair fall out. Don't perm or color your hair during chemotherapy. Those chemical treatments are already damaging to hair and can enhance hair loss. Once your chemo treatments are done and your hair has grown back, it's OK to resume dyeing or perming hair.  With chemotherapy, hair loss is almost always temporary. But when it grows back, it may be a different color or texture. In older adults who still had hair color before chemotherapy, the new growth may be completely gray.  Often, new hair is very fine and soft.  Infection Prevention Please wash your hands for at least 30 seconds using warm soapy water. Handwashing is the #1 way to prevent the spread of germs. Stay away from sick people or people who are getting over a cold. If you develop respiratory systems such as green/yellow mucus production or productive cough or persistent cough let us know and we will see if you need an antibiotic. It is a good idea to keep a pair of gloves on when going into grocery stores/[employer] to decrease your risk of coming into contact with germs on the carts, etc. Carry alcohol hand gel with you at all times and use it frequently if out in public. If your temperature reaches 100.4 or higher please call the clinic and let us know.  If it is after hours or on the weekend please go to the ER if your temperature is over 100.4.  Please have your own personal  thermometer at home to use.    Sex and bodily fluids If you are going to have sex, a condom must be used to protect the person that isn't taking chemotherapy. Chemo can decrease your libido (sex drive). For a few days after chemotherapy, chemotherapy can be excreted through your bodily fluids.  When using the toilet please close the lid and flush the toilet twice.  Do this for a few day after you have had chemotherapy.   Effects of chemotherapy on your sex life Some changes are simple and won't last long. They won't affect your sex life permanently.  Sometimes you may feel: too tired not strong enough to be very active sick or sore  not in the mood anxious or low  Your anxiety might not seem related to sex. For example, you may be worried about the cancer and how your treatment is going. Or you may be worried about money, or about how you family are coping with your illness.  These things can cause stress, which can affect your interest in sex. It's important to talk to your partner about how you feel.  Remember - the changes to your sex life don't usually last long. There's usually no medical reason to stop having sex during chemo. The drugs won't have any long term physical effects on your performance or enjoyment of sex. Cancer can't be passed on to your partner during sex  Contraception It's important to use reliable contraception during treatment. Avoid getting pregnant while you or your partner are having chemotherapy. This is because the drugs may harm the baby. Sometimes chemotherapy drugs can leave a man or woman infertile.  This means you would not be able to have children in the future. You might want to talk to someone about permanent  infertility. It can be very difficult to learn that you may no longer be able to have children. Some people find counselling helpful. There might be ways to preserve your fertility, although this is easier for men than for women. You may want to speak to a  fertility expert. You can talk about sperm banking or harvesting your eggs. You can also ask about other fertility options, such as donor eggs. If you have or have had breast cancer, your doctor might advise you not to take the contraceptive pill. This is because the hormones in it might affect the cancer. It is not known for sure whether or not chemotherapy drugs can be passed on through semen or secretions from the vagina. Because of this some doctors advise people to use a barrier method if you have sex during treatment. This applies to vaginal, anal or oral sex. Generally, doctors advise a barrier method only for the time you are actually having the treatment and for about a week after your treatment. Advice like this can be worrying, but this does not mean that you have to avoid being intimate with your partner. You can still have close contact with your partner and continue to enjoy sex.  Animals If you have cats or birds we just ask that you not change the litter or change the cage.  Please have someone else do this for you while you are on chemotherapy.   Food Safety During and After Cancer Treatment Food safety is important for people both during and after cancer treatment. Cancer and cancer treatments, such as chemotherapy, radiation therapy, and stem cell/bone marrow transplantation, often weaken the immune system. This makes it harder for your body to protect itself from foodborne illness, also called food poisoning. Foodborne illness is caused by eating food that contains harmful bacteria, parasites, or viruses.  Foods to avoid Some foods have a higher risk of becoming tainted with bacteria. These include: Unwashed fresh fruit and vegetables, especially leafy vegetables that can hide dirt and other contaminants Raw sprouts, such as alfalfa sprouts Raw or undercooked beef, especially ground beef, or other raw or undercooked meat and poultry Fatty, fried, or spicy foods immediately before or  after treatment.  These can sit heavy on your stomach and make you feel nauseous. Raw or undercooked shellfish, such as oysters. Sushi and sashimi, which often contain raw fish.  Unpasteurized beverages, such as unpasteurized fruit juices, raw milk, raw yogurt, or cider Undercooked eggs, such as soft boiled, over easy, and poached; raw, unpasteurized eggs; or foods made with raw egg, such as homemade raw cookie dough and homemade mayonnaise  Simple steps for food safety  Shop smart. Do not buy food stored or displayed in an unclean area. Do not buy bruised or damaged fruits or vegetables. Do not buy cans that have cracks, dents, or bulges. Pick up foods that can spoil at the end of your shopping trip and store them in a cooler on the way home.  Prepare and clean up foods carefully. Rinse all fresh fruits and vegetables under running water, and dry them with a clean towel or paper towel. Clean the top of cans before opening them. After preparing food, wash your hands for 20 seconds with hot water and soap. Pay special attention to areas between fingers and under nails. Clean your utensils and dishes with hot water and soap. Disinfect your kitchen and cutting boards using 1 teaspoon of liquid, unscented bleach mixed into 1 quart of water.  Dispose of old food. Eat canned and packaged food before its expiration date (the "use by" or "best before" date). Consume refrigerated leftovers within 3 to 4 days. After that time, throw out the food. Even if the food does not smell or look spoiled, it still may be unsafe. Some bacteria, such as Listeria, can grow even on foods stored in the refrigerator if they are kept for too long.  Take precautions when eating out. At restaurants, avoid buffets and salad bars where food sits out for a long time and comes in contact with many people. Food can become contaminated when someone with a virus, often a norovirus, or another "bug" handles it. Put any  leftover food in a "to-go" container yourself, rather than having the server do it. And, refrigerate leftovers as soon as you get home. Choose restaurants that are clean and that are willing to prepare your food as you order it cooked.   AT HOME MEDICATIONS:                                                                                                                                                                Compazine/Prochlorperazine 10mg  tablet. Take 1 tablet every 6 hours as needed for nausea/vomiting. (This can make you sleepy)   EMLA cream. Apply a quarter size amount to port site 1 hour prior to chemo. Do not rub in. Cover with plastic wrap.    Diarrhea Sheet   If you are having loose stools/diarrhea, please purchase Imodium and begin taking as outlined:  At the first sign of poorly formed or loose stools you should begin taking Imodium (loperamide) 2 mg capsules.  Take two tablets (4mg ) followed by one tablet (2mg ) every 2 hours - DO NOT EXCEED 8 tablets in 24 hours.  If it is bedtime and you are having loose stools, take 2 tablets at bedtime, then 2 tablets every 4 hours until morning.   Always call the Cancer Center if you are having loose stools/diarrhea that you can't get under control.  Loose stools/diarrhea leads to dehydration (loss of water) in your body.  We have other options of trying to get the loose stools/diarrhea to stop but you must let us know!   Constipation Sheet  Colace - 100 mg capsules - take 2 capsules daily.  If this doesn't help then you can increase to 2 capsules twice daily.  Please call if the above does not work for you. Do not go more than 2 days without a bowel movement.  It is very important that you do not become constipated.  It will make you feel sick to your stomach (nausea) and can cause abdominal pain and vomiting.  Nausea Sheet   Compazine/Prochlorperazine 10mg  tablet. Take 1 tablet every 6 hours as  needed for nausea/vomiting (This can  make you drowsy).  If you are having persistent nausea (nausea that does not stop) please call the Cancer Center and let us know the amount of nausea that you are experiencing.  If you begin to vomit, you need to call the Cancer Center and if it is the weekend and you have vomited more than one time and can't get it to stop-go to the Emergency Room.  Persistent nausea/vomiting can lead to dehydration (loss of fluid in your body) and will make you feel very weak and unwell. Ice chips, sips of clear liquids, foods that are at room temperature, crackers, and toast tend to be better tolerated.   SYMPTOMS TO REPORT AS SOON AS POSSIBLE AFTER TREATMENT:  FEVER GREATER THAN 100.4 F  CHILLS WITH OR WITHOUT FEVER  NAUSEA AND VOMITING THAT IS NOT CONTROLLED WITH YOUR NAUSEA MEDICATION  UNUSUAL SHORTNESS OF BREATH  UNUSUAL BRUISING OR BLEEDING  TENDERNESS IN MOUTH AND THROAT WITH OR WITHOUT PRESENCE OF ULCERS  URINARY PROBLEMS  BOWEL PROBLEMS  UNUSUAL RASH      Wear comfortable clothing and clothing appropriate for easy access to any Portacath or PICC line. Let us know if there is anything that we can do to make your therapy better!    What to do if you need assistance after hours or on the weekends: CALL [phone].  HOLD on the line, do not hang up.  You will hear multiple messages but at the end you will be connected with a nurse triage line.  They will contact the doctor if necessary.  Most of the time they will be able to assist you.  Do not call the hospital operator.      I have been informed and understand all of the instructions given to me and have received a copy. I have been instructed to call the clinic [phone] or my family physician as soon as possible for continued medical care, if indicated. I do not have any more questions at this time but understand that I may call the Cancer Center or the Patient Navigator at [phone] during office hours should I have  questions or need assistance in obtaining follow-up care.   Doxorubicin Liposomal (Doxil)  Doxil is a version of the chemotherapy medication doxorubicin that is covered in a protective coating, which allows it to avoid destruction by the body's immune system and remain in the body for a longer amount of time. As a result, Doxil has more time to reach the tumor tissue, where the medication is then slowly released. Doxorubicin liposomal interferes with the growth of cancer cells and slows their spread in the body by inhibiting DNA synthesis and causing the production of harmful free radicals.  How to Take Doxorubicin Liposomal Doxorubicin is given intravenously (IV, into a vein). It is an infusion that is given over 60 minutes.   This medication is red and your urine may appear orange or reddish in color for 1-2 days after the infusion. This is not blood. This is expected as the medication is cleared from your body. If the red/orange urine continues past two days or if you have other urinary symptoms, such as frequency or painful urination, call your healthcare provider.  Possible Side Effects of Doxorubicin Liposomal There are a number of things you can do to manage the side effects of doxorubicin liposomal. Talk to your care team about these recommendations. They can help you decide what will work best for you.  These are some of the most common or important side effects:  Infusion-Related Side Effects The infusion can cause a reaction that may lead to shortness of breath, facial swelling, fast heart rate, rash, chills, chest or throat tightness or pain, back pain, fever, low blood pressure, nausea, and vomiting. This is most often associated with the first time this medication is administered. If you notice any of these symptoms or changes in how you feel during the infusion, let your nurse know right away.  Heart Problems  This medication can cause damage to your heart, including congestive  heart failure and restrictive cardiomyopathy. It is important that you report immediately to your doctor or nurse any shortness of breath, cough, ankle swelling, chest pain, or rapid or irregular heartbeats. Your doctor care team may order tests to check your heart function prior to receiving this drug or to evaluate your heart when symptoms develop.  Low Platelet Count (Thrombocytopenia) Platelets help your blood clot, so when the count is low you are at a higher risk of bleeding. Let your oncology care team know if you have any excess bruising or bleeding, including nose bleeds, bleeding gums, or blood in your urine or stool. If the platelet count becomes too low, you may receive a transfusion of platelets.  Do not use a razor (an Neurosurgeon is fine). Avoid contact sports and activities that can result in injury or bleeding. Do not take aspirin (salicylic acid), non-steroidal, anti-inflammatory medications (NSAIDs) such as Motrin/Advil (ibuprofen), Aleve (naproxen), Celebrex (celecoxib) etc. as these can all increase the risk of bleeding. Please consult with your healthcare team regarding use of these agents and all over the counter medications/supplements while on therapy. Do not floss or use toothpicks and use a soft-bristle toothbrush to brush your teeth. Low White Blood Cell Count (Leukopenia or Neutropenia) White blood cells (WBC) are important for fighting infection. While receiving treatment, your WBC count can drop, putting you at a higher risk of getting an infection. You should let your doctor or nurse know right away if you have a fever (temperature greater than 100.7F or 38C, sore throat or cold, shortness of breath, cough, burning with urination, or a sore that doesn't heal.   Tips to preventing infection:  Washing hands, both yours and your visitors, is the best way to prevent the spread of infection. Avoid large crowds and people who are sick (i.e.: those who have a cold, fever  or cough or live with someone with these symptoms). When working in your yard, wear protective clothing including long pants and gloves. Do not handle pet waste. Keep all cuts or scratches clean. Shower or bathe daily and perform frequent mouth care. Do not cut cuticles or ingrown nails. You may wear nail polish, but not fake nails. Ask your oncology care team before scheduling dental appointments or procedures. Ask your oncology care team before you, or someone you live with has any vaccinations. Low Red Blood Cell Count (Anemia) Your red blood cells are responsible for carrying oxygen to the tissues in your body. When the red cell count is low, you may feel tired or weak. You should let your oncology care team know if you experience any shortness of breath, difficulty breathing, or pain in your chest. If the count gets too low, you may receive a blood transfusion.  Hand Foot Syndrome Hand foot syndrome (HFS) is a skin reaction that appears on the palms of the hands and/or the soles of the feet, as a result  of certain chemotherapy agents being absorbed by the skin cells. HFS can begin as a mild tingling, numbness, pins-and-needles feeling, redness or pain or swelling of the hands and/or feet. This can then progress to painful swelling, blistering, or peeling skin that can interfere with your ability to do normal activities. Be sure to let your oncology team know right away if you notice these symptoms, as they may need to adjust the chemotherapy dose or take a break to allow the skin to heal. Some tips to help prevent HFS include:  Keep hands and feet clean and dry. Avoid tight shoes or socks. Avoid activities that put pressure on the palms or soles for 1 week after treatment. Apply an alcohol-free moisturizer liberally and often.  (Avoid moisturizers with perfumes or scents) Avoid very hot water for baths and showers. Nausea and/or Vomiting Talk to your oncology care team so they can prescribe  medications to help you manage nausea and vomiting. In addition, dietary changes may help. Avoid things that may worsen the symptoms, such as heavy or greasy/fatty, spicy or acidic foods (lemons, tomatoes, oranges). Try saltines, or ginger ale to lessen symptoms.  Call your oncology care team if you are unable to keep fluids down for more than 12 hours or if you feel lightheaded or dizzy at any time.  Mouth Ulcers (Mucositis) Certain cancer treatments can cause sores or soreness in your mouth and/or throat. Notify your oncology care team if your mouth, tongue, inside of your cheek or throat becomes white, ulcerated, or painful. Performing regular mouth care can help prevent or manage mouth sores. If mouth sores become painful, your doctor or nurse can recommend a pain reliever.  Brush with a soft-bristle toothbrush or cotton swab twice a day. Avoid mouthwashes that contain alcohol. A baking soda and/or salt with warm water mouth rinse (2 level teaspoons of baking soda or 1 level teaspoon of salt in an eight ounce glass of warm water) is recommended 4 times daily. If your mouth becomes dry, eat moist foods, drink plenty of fluids (6-8 glasses), and suck on sugarless hard candy. Avoid smoking and chewing tobacco, drinking alcoholic beverages and citrus juices. Fatigue Fatigue is very common during cancer treatment and is an overwhelming feeling of exhaustion that is not usually relieved by rest. While on cancer treatment, and for a period after, you may need to adjust your schedule to manage fatigue. Plan times to rest during the day and conserve energy for more important activities. Exercise can help combat fatigue; a simple daily walk with a friend can help. Talk to your healthcare team for helpful tips on dealing with this side effect.  Rash Some patients may develop a rash, scaly skin, or red itchy bumps. Use an alcohol-free moisturizer on your skin and lips; avoid moisturizers with perfumes or  scents. Your doctor or nurse can recommend a topical medication if itching is bothersome. If your skin does crack or bleed, be sure to keep the area clean to avoid infection. Be sure to notify your healthcare provider of any rash that develops, as this can be a reaction. They can give you more tips on caring for your skin.  Decrease in Appetite or Taste Changes Nutrition is an important part of your care. Cancer treatment can affect your appetite and, in some cases, the side effects of treatment can make eating difficult. Ask your oncology care team about nutritional counseling services at your treatment center to help with food choices.  Try to eat five  or six small meals or snacks throughout the day, instead of 3 larger meals. If you are not eating enough, nutritional supplements may help. You may experience a metallic taste or find that food has no taste at all. You may dislike foods or beverages that you liked before receiving cancer treatment. These symptoms can last for several months or longer after treatment ends. Avoid any food that you think smells or tastes bad. If red meat is a problem, eat chicken, [place], eggs, dairy products, and fish without a strong smell. Sometimes cold food has less of an odor. Add extra flavor to meat or fish by marinating it in sweet juices, sweet and sour sauce or dressings. Use seasonings like basil, oregano, or rosemary to add flavor. Bacon, ham, and onion can add flavor to vegetables. Diarrhea Your oncology care team can recommend medications to relieve diarrhea. Also, try eating low-fiber, bland foods, such as white rice and boiled or baked chicken. Avoid raw fruits, vegetables, whole grain breads, cereals, and seeds. Soluble fiber is found in some foods and absorbs fluid, which can help relieve diarrhea. Foods high in soluble fiber include: applesauce, bananas (ripe), canned fruit, orange sections, boiled potatoes, white rice, products made with white flour,  oatmeal, cream of rice, cream of wheat, and farina. Drink 8-10 glasses of non-alcoholic, un-caffeinated fluid a day to prevent dehydration.  Constipation There are several things you can do to prevent or relieve constipation. Include fiber in your diet (fruits and vegetables), drink 8-10 glasses of non-alcoholic fluids a day, and keep active. A stool softener once or twice a day may prevent constipation. If you do not have a bowel movement for 2-3 days, you should contact your healthcare team for suggestions to relieve the constipation.  Less common, but important side effects can include:  Secondary Cancer: There is a very low risk of developing an oral (mouth) cancer after treatment with this medication, which can occur many years after treatment. If you develop an ulcer or sore that does not heal in your mouth or on your gums, notify your healthcare provider.  Radiation Recall: Radiation recall is when the administration of a medication causes a skin reaction that looks like a sunburn (redness, swelling, soreness, peeling skin) in areas where radiation was previously given. Notify your oncology team if you notice this side effect. Treatment can include topical steroid ointments and a delay in your next chemotherapy dose.  Sexual & Reproductive Concerns This drug may affect your reproductive system, resulting in the menstrual cycle or sperm production becoming irregular or stopping permanently. Women may experience menopausal effects including hot flashes and vaginal dryness. In addition, the desire for sex may decrease during treatment.  Exposure of an unborn child to this medication could cause birth defects, so you should not become pregnant or father a child while on this medication. Women will be asked to take a pregnancy test prior to receiving this medication. Effective birth control is necessary during treatment and for 6 months after treatment, even if your menstrual cycle stops or you  believe you are not producing sperm. You may want to consider sperm banking or egg harvesting if you may wish to have a child in the future. Discuss these options with your oncology team. You should not breastfeed while receiving this medication.

## 2023-01-12 NOTE — Progress Notes (Signed)
Pharmacist Chemotherapy Monitoring - Initial Assessment    Anticipated start date: 01/12/23   The following has been reviewed per standard work regarding the patient's treatment regimen: The patient's diagnosis, treatment plan and drug doses, and organ/hematologic function Lab orders and baseline tests specific to treatment regimen  The treatment plan start date, drug sequencing, and pre-medications Prior authorization status  Patient's documented medication list, including drug-drug interaction screen and prescriptions for anti-emetics and supportive care specific to the treatment regimen The drug concentrations, fluid compatibility, administration routes, and timing of the medications to be used The patient's access for treatment and lifetime cumulative dose history, if applicable  The patient's medication allergies and previous infusion related reactions, if applicable   Changes made to treatment plan:  pre-medications added cetirizine and famotidine due to >6 doses of Carboplatin.  Follow up needed:  N/A   Stephens Shire, [facility], 01/12/2023  10:16 AM

## 2023-01-13 ENCOUNTER — Telehealth: Payer: Self-pay | Admitting: Hematology

## 2023-01-13 ENCOUNTER — Telehealth: Payer: Self-pay

## 2023-01-13 ENCOUNTER — Encounter: Payer: Self-pay | Admitting: Hematology

## 2023-01-13 NOTE — Telephone Encounter (Signed)
Pt questioned if she could still get assist from the [employer]. Advised her of the old guidelines of the grant and that recently the guidelines have changed.   She also asked that I look for assist from other funds and etc. I advised her I would.  I will send a message to [facility] to see if there is copay assist and etc available for her.

## 2023-01-13 NOTE — Telephone Encounter (Signed)
Chemotherapy 24 hour follow up call.  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 She has used gabapentin in the past without much relief. Her breathing was "shallow" this morning but is better. She does think the left side of her face is more puffy than the right but both sides have improved since this morning. Instructed the patient if her breathing changes/worsens to call 911. She does not have a ride to the ER and doesn't want to go to the ER unless she has to.  Provider made aware.

## 2023-01-14 ENCOUNTER — Inpatient Hospital Stay: Payer: Medicare Other

## 2023-01-14 VITALS — BP 114/59 | HR 79 | Temp 97.4°F | Resp 18

## 2023-01-14 DIAGNOSIS — Z5111 Encounter for antineoplastic chemotherapy: Secondary | ICD-10-CM | POA: Diagnosis not present

## 2023-01-14 DIAGNOSIS — C562 Malignant neoplasm of left ovary: Secondary | ICD-10-CM

## 2023-01-14 MED ORDER — PEGFILGRASTIM-FPGK 6 MG/0.6ML ~~LOC~~ SOSY
6.0000 mg | PREFILLED_SYRINGE | Freq: Once | SUBCUTANEOUS | Status: AC
  Administered 2023-01-14: 6 mg via SUBCUTANEOUS
  Filled 2023-01-14: qty 0.6

## 2023-01-14 NOTE — Progress Notes (Signed)
Patient tolerated Stimufend injection with no complaints voiced.  Site clean and dry with no bruising or swelling noted at site.  See MAR for details.  Band aid applied.  Patient stable during and after injection. Pt express to RN that she felt achy today but has gotten better since she has gotten up this morning. RN educated pt on the importance of taking Claritin to help with the achy feeling from the injection, pt verbalized understanding and all questions answered.  Vss with discharge and left in satisfactory condition with no s/s of distress noted. All follow ups as scheduled.   Shanielle Correll [employer]

## 2023-01-14 NOTE — Patient Instructions (Signed)

## 2023-01-27 ENCOUNTER — Other Ambulatory Visit: Payer: Self-pay

## 2023-02-10 ENCOUNTER — Other Ambulatory Visit: Payer: Self-pay

## 2023-02-10 NOTE — Progress Notes (Signed)
[facility] [address], [place] [address]    Clinic Day:  02/11/2023  Referring physician: Adolphus Birchwood, MD  Patient Care Team: Adolphus Birchwood, MD as PCP - General (Gynecologic Oncology) Doreatha Massed, MD as Medical Oncologist (Medical Oncology) Therese Sarah, RN as Oncology Nurse Navigator (Oncology) [name], Consuello Bossier, NP as Nurse Practitioner (Cardiothoracic Surgery)   ASSESSMENT & PLAN:   Assessment: 1. Stage IIIb high-grade serous ovarian carcinoma: - Presentation with abdominal distention and bloating sensation. - CT abdomen at outside hospital on 12/05/2020 with findings concerning for ovarian neoplasm. - CT chest without contrast on 12/18/2020 with scattered solid pulmonary nodules measuring up to 3 mm, indeterminate. - CA125 on 12/05/2020-10000 - 12/18/2020 exploratory laparotomy, bilateral salpingo-oophorectomy, infra gastric omentectomy, R0 primary tumor debulking by Dr. Andrey Farmer at [facility]. - Pathology-left ovary high-grade serous carcinoma, greatest dimension 26.5 cm, left ovarian capsule intact.  Fallopian tube surface involvement present, omentum involvement present, largest extrapelvic peritoneal focus-macroscopic, peritoneal/ascitic fluid involvement negative.  PT3BPNX.  FIGO stage IIIb. - She was evaluated for FLORA 5 clinical trial, found not to be a candidate. - Germline mutation testing was negative. - 6 cycles of carboplatin and paclitaxel from 02/11/2021 through 05/28/2021.  She has refused niraparib maintenance. - CTAP on 11/10/2022 showed recurrence in the left pelvic sidewall. - She met with Dr. Andrey Farmer and felt not a candidate for debulking.  She is considered platinum sensitive. - Carboplatin, Doxil started on 01/12/2023    2. Social/family history: - She is seen with her daughter-in-law today.  She lives by herself.  She ambulates with the help of cane.  She retired after working at [employer].  She is non-smoker. -  Brother had prostate cancer.  Another brother had throat cancer.  Sister had kidney cancer.  Grandson had bladder cancer at age 78.  Paternal uncle had head and neck cancer.    Plan: 1. Stage IIIb high-grade serous ovarian carcinoma: - She received cycle 1 on 01/12/2023. - She reported body pains and tiredness lasting about 2 weeks. - Reviewed labs today: Normal LFTs.  CBC grossly normal. - She may proceed with cycle 2 today.  Will decrease Doxil dose by 25%.  Will decrease carboplatin to 400 mg flat dose.  Will add bevacizumab 10 mg/kg on day 1.  Will not give day 15 dose with this cycle. - RTC 4 weeks for follow-up.  2.  Hypomagnesemia: - She is taking magnesium once daily.  Magnesium is 1.6 today.  Increase magnesium to twice daily.   3.  Peripheral neuropathy: - Numbness in the feet is stable.  We are avoiding paclitaxel.  4.  High risk drug monitoring: - We checked echocardiogram on 01/07/2023 with EF 65 to 70%.  Will closely monitor while on Doxil.    Orders Placed This Encounter  Procedures   Magnesium    Standing Status:   Future    Standing Expiration Date:   04/07/2024   CBC with Differential    Standing Status:   Future    Standing Expiration Date:   04/07/2024   Comprehensive metabolic panel    Standing Status:   Future    Standing Expiration Date:   04/07/2024   Magnesium    Standing Status:   Future    Standing Expiration Date:   05/05/2024   CBC with Differential    Standing Status:   Future    Standing Expiration Date:   05/05/2024   Comprehensive metabolic panel  Standing Status:   Future    Standing Expiration Date:   05/05/2024   Magnesium    Standing Status:   Future    Standing Expiration Date:   06/02/2024   CBC with Differential    Standing Status:   Future    Standing Expiration Date:   06/02/2024   Comprehensive metabolic panel    Standing Status:   Future    Standing Expiration Date:   06/02/2024   Urinalysis, dipstick only    Standing Status:   Future     Standing Expiration Date:   06/02/2024      I,Katie Daubenspeck,acting as a scribe for Doreatha Massed, MD.,have documented all relevant documentation on the behalf of Doreatha Massed, MD,as directed by  Doreatha Massed, MD while in the presence of Doreatha Massed, MD.   I, Doreatha Massed MD, have reviewed the above documentation for accuracy and completeness, and I agree with the above.   Doreatha Massed, MD   12/11/20245:20 PM  CHIEF COMPLAINT:   Diagnosis: ovarian cancer    Cancer Staging  Ovarian cancer on left [facility]) Staging form: Ovary, Fallopian Tube, and Primary Peritoneal Carcinoma, AJCC 8th Edition - Clinical stage from 01/30/2021: FIGO Stage IIIB (cT3b, cNX, cM0) - Signed by Doreatha Massed, MD on 01/30/2021    Prior Therapy: 1. Exploratory laparotomy, BSO, and infra-gastric omentectomy on 12/19/2020 with Dr. Andrey Farmer  2. Carboplatin (AUC 6) / Paclitaxel (175) q21d x 6 cycles 02/11/2021 - 05/28/2021   Current Therapy:  carboplatin and Doxil with bevacizumab    HISTORY OF PRESENT ILLNESS:   Oncology History  Ovarian cancer on left [facility])  01/07/2021 Initial Diagnosis   Ovarian cancer on left [facility])   01/30/2021 Cancer Staging   Staging form: Ovary, Fallopian Tube, and Primary Peritoneal Carcinoma, AJCC 8th Edition - Clinical stage from 01/30/2021: FIGO Stage IIIB (cT3b, cNX, cM0) - Signed by Doreatha Massed, MD on 01/30/2021 Histologic grade (G): G3 Histologic grading system: 4 grade system   02/11/2021 - 05/28/2021 Chemotherapy   Patient is on Treatment Plan : OVARIAN Carboplatin (AUC 6) / Paclitaxel (175) q21d x 6 cycles     01/12/2023 -  Chemotherapy   Patient is on Treatment Plan : OVARIAN Liposomal Doxorubicin D1 + Carboplatin D1 + Bevacizumab D1,15 q28d / Bevacizumab q21d        INTERVAL HISTORY:   Rebecca [name] is a 77 y.o. female presenting to clinic today for follow up of ovarian cancer. She was last seen by me on  12/25/22.  Today, she states that she is doing well overall. Her appetite level is at 80%. Her energy level is at 60%.  PAST MEDICAL HISTORY:   Past Medical History: Past Medical History:  Diagnosis Date   Anemia    CAD (coronary artery disease)    DM2 (diabetes mellitus, type 2) (HCC)    GERD (gastroesophageal reflux disease)    HTN (hypertension)    Ovarian ca (HCC)    Port-A-Cath in place 02/02/2021    Surgical History: Past Surgical History:  Procedure Laterality Date   IR IMAGING GUIDED PORT INSERTION  02/08/2021    Social History: Social History   Socioeconomic History   Marital status: Single    Spouse name: Not on file   Number of children: Not on file   Years of education: Not on file   Highest education level: Not on file  Occupational History   Not on file  Tobacco Use   Smoking status: Not on file   Smokeless  tobacco: Not on file  Substance and Sexual Activity   Alcohol use: Not on file   Drug use: Not on file   Sexual activity: Not on file  Other Topics Concern   Not on file  Social History Narrative   Not on file   Social Determinants of Health   Financial Resource Strain: Low Risk  (02/05/2021)   Overall Financial Resource Strain (CARDIA)    Difficulty of Paying Living Expenses: Not hard at all  Food Insecurity: No Food Insecurity (02/05/2021)   Hunger Vital Sign    Worried About Running Out of Food in the Last Year: Never true    Ran Out of Food in the Last Year: Never true  Transportation Needs: Unmet Transportation Needs (02/05/2021)   PRAPARE - Administrator, Civil Service (Medical): Yes    Lack of Transportation (Non-Medical): No  Physical Activity: Not on file  Stress: Stress Concern Present (02/05/2021)   [employer] of Occupational Health - Occupational Stress Questionnaire    Feeling of Stress : To some extent  Social Connections: Moderately Isolated (02/05/2021)   Social Connection and Isolation Panel [NHANES]     Frequency of Communication with Friends and Family: More than three times a week    Frequency of Social Gatherings with Friends and Family: More than three times a week    Attends Religious Services: More than 4 times per year    Active Member of [employer] or Organizations: No    Attends Banker Meetings: Never    Marital Status: Widowed  Intimate Partner Violence: Not on file    Family History: No family history on file.  Current Medications:  Current Outpatient Medications:    acetaminophen (TYLENOL) 325 MG tablet, Take 3 tablets (975 mg total) by mouth every 6 (six) hours as needed for moderate pain., Disp: 360 tablet, Rfl: 2   atorvastatin (LIPITOR) 40 MG tablet, Take 40 mg by mouth at bedtime., Disp: , Rfl:    azelastine (ASTELIN) 0.1 % nasal spray, Place 1 spray into both nostrils 2 (two) times daily as needed for allergies., Disp: , Rfl:    B-D ULTRAFINE III SHORT PEN 31G X 8 MM MISC, SMARTSIG:1 Each SUB-Q Daily, Disp: , Rfl:    baclofen (LIORESAL) 10 MG tablet, Take 10 mg by mouth 3 (three) times daily as needed for muscle spasms., Disp: , Rfl:    clopidogrel (PLAVIX) 75 MG tablet, Take 75 mg by mouth daily., Disp: , Rfl:    diphenhydrAMINE-zinc acetate (BENADRYL) cream, Apply 1 application topically 3 (three) times daily as needed for itching., Disp: , Rfl:    docusate sodium (COLACE) 100 MG capsule, Take 1 capsule (100 mg total) by mouth daily as needed for mild constipation., Disp: 30 capsule, Rfl: 1   ferrous sulfate 325 (65 FE) MG tablet, Take 325 mg by mouth every evening., Disp: , Rfl:    furosemide (LASIX) 20 MG tablet, Take 1 tablet (20 mg total) by mouth in the morning., Disp: 30 tablet, Rfl: 2   gabapentin (NEURONTIN) 300 MG capsule, Take 1 capsule (300 mg total) by mouth 3 (three) times daily., Disp: 60 capsule, Rfl: 2   HYDROcodone-acetaminophen (NORCO/VICODIN) 5-325 MG tablet, Take 1 tablet by mouth every 6 (six) hours as needed for moderate pain., Disp: 60  tablet, Rfl: 0   losartan (COZAAR) 50 MG tablet, Take 50 mg by mouth at bedtime., Disp: , Rfl:    Misc. Devices MISC, Please provide patient with 3 diabetic  nutritional supplements per day., Disp: 1 each, Rfl: 11   pantoprazole (PROTONIX) 20 MG tablet, Take 20 mg by mouth daily., Disp: , Rfl:    predniSONE (DELTASONE) 50 MG tablet, Take 13hrs, 7hrs and 1hr before port placementTake 13hrs, 7hrs and 1hr before port placement, Disp: 3 tablet, Rfl: 0   SOLIQUA 100-33 UNT-MCG/ML SOPN, Inject 45 Units into the skin daily., Disp: , Rfl:    Tetrahydrozoline HCl (REDNESS RELIEVER EYE DROPS OP), Place 1 drop into both eyes daily as needed (redness)., Disp: , Rfl:    lidocaine-prilocaine (EMLA) cream, Apply to affected area once (Patient not taking: Reported on 02/11/2023), Disp: 30 g, Rfl: 3   magnesium oxide (MAG-OX) 400 (240 Mg) MG tablet, Take 1 tablet (400 mg total) by mouth 2 (two) times daily., Disp: 60 tablet, Rfl: 6   prochlorperazine (COMPAZINE) 10 MG tablet, Take 1 tablet (10 mg total) by mouth every 6 (six) hours as needed for nausea or vomiting. (Patient not taking: Reported on 02/11/2023), Disp: 60 tablet, Rfl: 3 No current facility-administered medications for this visit.  Facility-Administered Medications Ordered in Other Visits:    0.9 %  sodium chloride infusion, , Intravenous, Continuous, Doreatha Massed, MD, Stopped at 02/11/23 1226   dextrose 5 % solution, , Intravenous, Continuous, Doreatha Massed, MD, Stopped at 02/11/23 1441   sodium chloride flush (NS) 0.9 % injection 10 mL, 10 mL, Intracatheter, PRN, Doreatha Massed, MD, 10 mL at 02/11/23 1441   Allergies: Allergies  Allergen Reactions   Aspirin Hives   Mometasone Itching   Naproxen Sodium Itching   Penicillin G Swelling   Triamcinolone Itching   Codeine Palpitations   Ibuprofen Rash   Iodinated Contrast Media Itching and Rash    REVIEW OF SYSTEMS:   Review of Systems  Constitutional:  Negative for  chills, fatigue and fever.  HENT:   Negative for lump/mass, mouth sores, nosebleeds, sore throat and trouble swallowing.   Eyes:  Negative for eye problems.  Respiratory:  Positive for cough and shortness of breath.   Cardiovascular:  Negative for chest pain, leg swelling and palpitations.  Gastrointestinal:  Positive for constipation and nausea. Negative for abdominal pain, diarrhea and vomiting.  Genitourinary:  Negative for bladder incontinence, difficulty urinating, dysuria, frequency, hematuria and nocturia.   Musculoskeletal:  Negative for arthralgias, back pain, flank pain, myalgias and neck pain.  Skin:  Negative for itching and rash.  Neurological:  Positive for dizziness, headaches and numbness.  Hematological:  Does not bruise/bleed easily.  Psychiatric/Behavioral:  Positive for sleep disturbance. Negative for depression and suicidal ideas. The patient is not nervous/anxious.   All other systems reviewed and are negative.    VITALS:   Weight 212 lb (96.2 kg).  Wt Readings from Last 3 Encounters:  02/11/23 212 lb (96.2 kg)  02/11/23 212 lb (96.2 kg)  12/25/22 217 lb 3.2 oz (98.5 kg)    Body mass index is 39.51 kg/m.  Performance status (ECOG): 1 - Symptomatic but completely ambulatory  PHYSICAL EXAM:   Physical Exam Vitals and nursing note reviewed. Exam conducted with a chaperone present.  Constitutional:      Appearance: Normal appearance.  Cardiovascular:     Rate and Rhythm: Normal rate and regular rhythm.     Pulses: Normal pulses.     Heart sounds: Normal heart sounds.  Pulmonary:     Effort: Pulmonary effort is normal.     Breath sounds: Normal breath sounds.  Abdominal:     Palpations: Abdomen is soft.  There is no hepatomegaly, splenomegaly or mass.     Tenderness: There is no abdominal tenderness.  Musculoskeletal:     Right lower leg: No edema.     Left lower leg: No edema.  Lymphadenopathy:     Cervical: No cervical adenopathy.     Right cervical:  No superficial, deep or posterior cervical adenopathy.    Left cervical: No superficial, deep or posterior cervical adenopathy.     Upper Body:     Right upper body: No supraclavicular or axillary adenopathy.     Left upper body: No supraclavicular or axillary adenopathy.  Neurological:     General: No focal deficit present.     Mental Status: She is alert and oriented to person, place, and time.  Psychiatric:        Mood and Affect: Mood normal.        Behavior: Behavior normal.     LABS:      Latest Ref Rng & Units 02/11/2023    8:22 AM 01/12/2023    9:15 AM 11/06/2022    2:26 PM  CBC  WBC 4.0 - 10.5 K/uL 7.5  7.6  7.2   Hemoglobin 12.0 - 15.0 g/dL 9.8  16.1  09.6   Hematocrit 36.0 - 46.0 % 28.9  33.1  32.5   Platelets 150 - 400 K/uL 348  283  266       Latest Ref Rng & Units 02/11/2023    8:22 AM 01/12/2023    9:15 AM 11/06/2022    2:26 PM  CMP  Glucose 70 - 99 mg/dL 045  409  75   BUN 8 - 23 mg/dL 16  15  17    Creatinine 0.44 - 1.00 mg/dL 8.11  9.14  7.82   Sodium 135 - 145 mmol/L 135  135  140   Potassium 3.5 - 5.1 mmol/L 4.0  3.5  3.8   Chloride 98 - 111 mmol/L 102  100  104   CO2 22 - 32 mmol/L 23  27  25    Calcium 8.9 - 10.3 mg/dL 9.3  9.0  9.4   Total Protein 6.5 - 8.1 g/dL 7.0  7.2  7.4   Total Bilirubin <1.2 mg/dL 0.4  0.5  0.6   Alkaline Phos 38 - 126 U/L 83  95  74   AST 15 - 41 U/L 17  14  16    ALT 0 - 44 U/L 18  17  18       No results found for: "CEA1", "CEA" / No results found for: "CEA1", "CEA" No results found for: "PSA1" No results found for: "CAN199" Lab Results  Component Value Date   CAN125 13.7 11/06/2022    No results found for: "TOTALPROTELP", "ALBUMINELP", "A1GS", "A2GS", "BETS", "BETA2SER", "GAMS", "MSPIKE", "SPEI" Lab Results  Component Value Date   TIBC 343 11/06/2022   TIBC 365 06/24/2022   FERRITIN 301 11/06/2022   FERRITIN 186 06/24/2022   IRONPCTSAT 19 11/06/2022   IRONPCTSAT 19 06/24/2022   Lab Results  Component Value  Date   LDH 119 02/11/2021     STUDIES:   No results found.

## 2023-02-11 ENCOUNTER — Encounter: Payer: Self-pay | Admitting: Hematology

## 2023-02-11 ENCOUNTER — Inpatient Hospital Stay (HOSPITAL_BASED_OUTPATIENT_CLINIC_OR_DEPARTMENT_OTHER): Payer: Medicare Other | Admitting: Hematology

## 2023-02-11 ENCOUNTER — Inpatient Hospital Stay: Payer: Medicare Other | Attending: Hematology

## 2023-02-11 ENCOUNTER — Inpatient Hospital Stay: Payer: Medicare Other

## 2023-02-11 VITALS — Wt 212.0 lb

## 2023-02-11 VITALS — BP 127/61 | HR 110 | Temp 97.8°F | Resp 18 | Ht 63.0 in | Wt 212.0 lb

## 2023-02-11 DIAGNOSIS — C562 Malignant neoplasm of left ovary: Secondary | ICD-10-CM

## 2023-02-11 DIAGNOSIS — Z5111 Encounter for antineoplastic chemotherapy: Secondary | ICD-10-CM | POA: Diagnosis present

## 2023-02-11 DIAGNOSIS — Z79899 Other long term (current) drug therapy: Secondary | ICD-10-CM | POA: Diagnosis not present

## 2023-02-11 LAB — CBC WITH DIFFERENTIAL/PLATELET
Abs Immature Granulocytes: 0.05 10*3/uL (ref 0.00–0.07)
Basophils Absolute: 0.1 10*3/uL (ref 0.0–0.1)
Basophils Relative: 1 %
Eosinophils Absolute: 0.1 10*3/uL (ref 0.0–0.5)
Eosinophils Relative: 1 %
HCT: 28.9 % — ABNORMAL LOW (ref 36.0–46.0)
Hemoglobin: 9.8 g/dL — ABNORMAL LOW (ref 12.0–15.0)
Immature Granulocytes: 1 %
Lymphocytes Relative: 23 %
Lymphs Abs: 1.7 10*3/uL (ref 0.7–4.0)
MCH: 31.9 pg (ref 26.0–34.0)
MCHC: 33.9 g/dL (ref 30.0–36.0)
MCV: 94.1 fL (ref 80.0–100.0)
Monocytes Absolute: 0.7 10*3/uL (ref 0.1–1.0)
Monocytes Relative: 10 %
Neutro Abs: 4.9 10*3/uL (ref 1.7–7.7)
Neutrophils Relative %: 64 %
Platelets: 348 10*3/uL (ref 150–400)
RBC: 3.07 MIL/uL — ABNORMAL LOW (ref 3.87–5.11)
RDW: 15.8 % — ABNORMAL HIGH (ref 11.5–15.5)
WBC: 7.5 10*3/uL (ref 4.0–10.5)
nRBC: 0 % (ref 0.0–0.2)

## 2023-02-11 LAB — COMPREHENSIVE METABOLIC PANEL WITH GFR
ALT: 18 U/L (ref 0–44)
AST: 17 U/L (ref 15–41)
Albumin: 3.7 g/dL (ref 3.5–5.0)
Alkaline Phosphatase: 83 U/L (ref 38–126)
Anion gap: 10 (ref 5–15)
BUN: 16 mg/dL (ref 8–23)
CO2: 23 mmol/L (ref 22–32)
Calcium: 9.3 mg/dL (ref 8.9–10.3)
Chloride: 102 mmol/L (ref 98–111)
Creatinine, Ser: 1.13 mg/dL — ABNORMAL HIGH (ref 0.44–1.00)
GFR, Estimated: 51 mL/min — ABNORMAL LOW (ref >60)
Glucose, Bld: 173 mg/dL — ABNORMAL HIGH (ref 70–99)
Potassium: 4 mmol/L (ref 3.5–5.1)
Sodium: 135 mmol/L (ref 135–145)
Total Bilirubin: 0.4 mg/dL (ref <1.2)
Total Protein: 7 g/dL (ref 6.5–8.1)

## 2023-02-11 LAB — MAGNESIUM: Magnesium: 1.6 mg/dL — ABNORMAL LOW (ref 1.7–2.4)

## 2023-02-11 MED ORDER — DEXTROSE 5 % IV SOLN: INTRAVENOUS | Status: DC

## 2023-02-11 MED ORDER — FAMOTIDINE IN NACL 20-0.9 MG/50ML-% IV SOLN
20.0000 mg | Freq: Once | INTRAVENOUS | Status: AC
Start: 2023-02-11 — End: 2023-02-11
  Administered 2023-02-11: 20 mg via INTRAVENOUS
  Filled 2023-02-11: qty 50

## 2023-02-11 MED ORDER — SODIUM CHLORIDE 0.9 % IV SOLN
10.0000 mg/kg | Freq: Once | INTRAVENOUS | Status: AC
  Administered 2023-02-11: 1000 mg via INTRAVENOUS
  Filled 2023-02-11: qty 32

## 2023-02-11 MED ORDER — DEXAMETHASONE SODIUM PHOSPHATE 10 MG/ML IJ SOLN
10.0000 mg | Freq: Once | INTRAMUSCULAR | Status: AC
  Administered 2023-02-11: 10 mg via INTRAVENOUS
  Filled 2023-02-11: qty 1

## 2023-02-11 MED ORDER — PALONOSETRON HCL INJECTION 0.25 MG/5ML
0.2500 mg | Freq: Once | INTRAVENOUS | Status: AC
Start: 2023-02-11 — End: 2023-02-11
  Administered 2023-02-11: 0.25 mg via INTRAVENOUS
  Filled 2023-02-11: qty 5

## 2023-02-11 MED ORDER — SODIUM CHLORIDE 0.9 % IV SOLN
INTRAVENOUS | Status: DC
Start: 2023-02-11 — End: 2023-02-11

## 2023-02-11 MED ORDER — SODIUM CHLORIDE 0.9% FLUSH
10.0000 mL | INTRAVENOUS | Status: DC | PRN
  Administered 2023-02-11: 10 mL via INTRAVENOUS

## 2023-02-11 MED ORDER — MAGNESIUM SULFATE 2 GM/50ML IV SOLN
2.0000 g | Freq: Once | INTRAVENOUS | Status: AC
Start: 2023-02-11 — End: 2023-02-11
  Administered 2023-02-11: 2 g via INTRAVENOUS
  Filled 2023-02-11: qty 50

## 2023-02-11 MED ORDER — DOXORUBICIN HCL LIPOSOMAL CHEMO INJECTION 2 MG/ML
22.5000 mg/m2 | Freq: Once | INTRAVENOUS | Status: AC
  Administered 2023-02-11: 50 mg via INTRAVENOUS
  Filled 2023-02-11: qty 25

## 2023-02-11 MED ORDER — SODIUM CHLORIDE 0.9 % IV SOLN: 10.0000 mg | Freq: Once | INTRAVENOUS | Status: DC

## 2023-02-11 MED ORDER — SODIUM CHLORIDE 0.9 % IV SOLN
400.0000 mg | Freq: Once | INTRAVENOUS | Status: AC
  Administered 2023-02-11: 400 mg via INTRAVENOUS
  Filled 2023-02-11: qty 40

## 2023-02-11 MED ORDER — SODIUM CHLORIDE 0.9% FLUSH
10.0000 mL | INTRAVENOUS | Status: DC | PRN
  Administered 2023-02-11: 10 mL

## 2023-02-11 MED ORDER — MAGNESIUM OXIDE -MG SUPPLEMENT 400 (240 MG) MG PO TABS: 1.0000 | ORAL_TABLET | Freq: Two times a day (BID) | ORAL | 6 refills | Status: DC

## 2023-02-11 MED ORDER — CETIRIZINE HCL 10 MG/ML IV SOLN
10.0000 mg | Freq: Once | INTRAVENOUS | Status: AC
Start: 2023-02-11 — End: 2023-02-11
  Administered 2023-02-11: 10 mg via INTRAVENOUS
  Filled 2023-02-11: qty 1

## 2023-02-11 MED ORDER — HEPARIN SOD (PORK) LOCK FLUSH 100 UNIT/ML IV SOLN
500.0000 [IU] | Freq: Once | INTRAVENOUS | Status: AC | PRN
Start: 2023-02-11 — End: 2023-02-11
  Administered 2023-02-11: 500 [IU]

## 2023-02-11 MED ORDER — SODIUM CHLORIDE 0.9 % IV SOLN
150.0000 mg | Freq: Once | INTRAVENOUS | Status: AC
  Administered 2023-02-11: 150 mg via INTRAVENOUS
  Filled 2023-02-11: qty 150

## 2023-02-11 NOTE — Patient Instructions (Signed)
[facility] [place] - A DEPT OF [facility]  Discharge Instructions: Thank you for choosing [facility] to provide your oncology and hematology care.  If you have a lab appointment with the Cancer Center - please note that after April 8th, 2024, all labs will be drawn in the cancer center.  You do not have to check in or register with the main entrance as you have in the past but will complete your check-in in the cancer center.  Wear comfortable clothing and clothing appropriate for easy access to any Portacath or PICC line.   We strive to give you quality time with your provider. You may need to reschedule your appointment if you arrive late (15 or more minutes).  Arriving late affects you and other patients whose appointments are after yours.  Also, if you miss three or more appointments without notifying the office, you may be dismissed from the clinic at the provider's discretion.      For prescription refill requests, have your pharmacy contact our office and allow 72 hours for refills to be completed.    Today you received the following chemotherapy and/or immunotherapy agents Bevacizumab, Carboplatin and Doxil, return as scheduled.   To help prevent nausea and vomiting after your treatment, we encourage you to take your nausea medication as directed.  BELOW ARE SYMPTOMS THAT SHOULD BE REPORTED IMMEDIATELY: *FEVER GREATER THAN 100.4 F (38 C) OR HIGHER *CHILLS OR SWEATING *NAUSEA AND VOMITING THAT IS NOT CONTROLLED WITH YOUR NAUSEA MEDICATION *UNUSUAL SHORTNESS OF BREATH *UNUSUAL BRUISING OR BLEEDING *URINARY PROBLEMS (pain or burning when urinating, or frequent urination) *BOWEL PROBLEMS (unusual diarrhea, constipation, pain near the anus) TENDERNESS IN MOUTH AND THROAT WITH OR WITHOUT PRESENCE OF ULCERS (sore throat, sores in mouth, or a toothache) UNUSUAL RASH, SWELLING OR PAIN  UNUSUAL VAGINAL DISCHARGE OR ITCHING   Items with * indicate a  potential emergency and should be followed up as soon as possible or go to the Emergency Department if any problems should occur.  Please show the CHEMOTHERAPY ALERT CARD or IMMUNOTHERAPY ALERT CARD at check-in to the Emergency Department and triage nurse.  Should you have questions after your visit or need to cancel or reschedule your appointment, please contact [facility] [place] - A DEPT OF [facility] [phone]  and follow the prompts.  Office hours are 8:00 a.m. to 4:30 p.m. Monday - Friday. Please note that voicemails left after 4:00 p.m. may not be returned until the following business day.  We are closed weekends and major holidays. You have access to a nurse at all times for urgent questions. Please call the main number to the clinic [phone] and follow the prompts.  For any non-urgent questions, you may also contact your provider using MyChart. We now offer e-Visits for anyone 65 and older to request care online for non-urgent symptoms. For details visit [email or web address].   Also download the MyChart app! Go to the app store, search "MyChart", open the app, select [place], and log in with your MyChart username and password.

## 2023-02-11 NOTE — Progress Notes (Signed)
Confirmed flat dose of Carboplatin ordered today at 400 mg.  Pryor Ochoa, PharmD

## 2023-02-11 NOTE — Progress Notes (Signed)
Patient presents today for treatment, patient okay for treatment today with Carboplatin reduced to 400mg  and Doxil reduced by 25% and bevacizumab added to plan. Consent signed. Patient tolerated chemotherapy with no complaints voiced. Side effects with management reviewed understanding verbalized. Port site clean and dry with no bruising or swelling noted at site. Good blood return noted before and after administration of chemotherapy. Band aid applied. Patient left in satisfactory condition with VSS and no s/s of distress noted.

## 2023-02-11 NOTE — Patient Instructions (Addendum)
[facility] at [facility] Discharge Instructions   You were seen and examined today by Dr. Ellin Saba.  He reviewed the results of your lab work which are normal/stable.   We will proceed with your treatment today.   You may start taking Claritin today - take daily starting on days of treatment and for 2-3 days after receiving the shot. This may help with your body aches and pains after the shot.   Return as scheduled.    Thank you for choosing [facility] at [facility] to provide your oncology and hematology care.  To afford each patient quality time with our provider, please arrive at least 15 minutes before your scheduled appointment time.   If you have a lab appointment with the Cancer Center please come in thru the Main Entrance and check in at the main information desk.  You need to re-schedule your appointment should you arrive 10 or more minutes late.  We strive to give you quality time with our providers, and arriving late affects you and other patients whose appointments are after yours.  Also, if you no show three or more times for appointments you may be dismissed from the clinic at the providers discretion.     Again, thank you for choosing [facility].  Our hope is that these requests will decrease the amount of time that you wait before being seen by our physicians.       _____________________________________________________________  Should you have questions after your visit to [facility], please contact our office at [phone] and follow the prompts.  Our office hours are 8:00 a.m. and 4:30 p.m. Monday - Friday.  Please note that voicemails left after 4:00 p.m. may not be returned until the following business day.  We are closed weekends and major holidays.  You do have access to a nurse 24-7, just call the main number to the clinic [phone] and do not press any options, hold on the line and a  nurse will answer the phone.    For prescription refill requests, have your pharmacy contact our office and allow 72 hours.    Due to Covid, you will need to wear a mask upon entering the hospital. If you do not have a mask, a mask will be given to you at the Main Entrance upon arrival. For doctor visits, patients may have 1 support person age 8 or older with them. For treatment visits, patients can not have anyone with them due to social distancing guidelines and our immunocompromised population.

## 2023-02-11 NOTE — Progress Notes (Signed)
Patient has been examined by Dr. Ellin Saba. Vital signs and labs have been reviewed by MD - ANC, Creatinine, LFTs, hemoglobin, and platelets are within treatment parameters per M.D. - pt may proceed with treatment. Carboplatin dose reduced to 400 mg and Doxil dose reduced by 25%, and adding Avastin today per MD. Primary RN and pharmacy notified.

## 2023-02-12 ENCOUNTER — Other Ambulatory Visit: Payer: Self-pay

## 2023-02-12 NOTE — Progress Notes (Signed)
24 hour call back: Patient denies any side effects related to treatment. (Avastin). Patient has no complaints and states , " I am feeling much better."

## 2023-02-13 ENCOUNTER — Inpatient Hospital Stay: Payer: Medicare Other

## 2023-02-13 DIAGNOSIS — Z5111 Encounter for antineoplastic chemotherapy: Secondary | ICD-10-CM | POA: Diagnosis not present

## 2023-02-13 DIAGNOSIS — C562 Malignant neoplasm of left ovary: Secondary | ICD-10-CM

## 2023-02-13 MED ORDER — PEGFILGRASTIM-FPGK 6 MG/0.6ML ~~LOC~~ SOSY
6.0000 mg | PREFILLED_SYRINGE | Freq: Once | SUBCUTANEOUS | Status: AC
Start: 2023-02-13 — End: 2023-02-13
  Administered 2023-02-13: 6 mg via SUBCUTANEOUS
  Filled 2023-02-13: qty 0.6

## 2023-02-13 NOTE — Patient Instructions (Signed)
[facility] [place] - A DEPT OF [facility]  Discharge Instructions: Thank you for choosing [facility] to provide your oncology and hematology care.  If you have a lab appointment with the Cancer Center - please note that after April 8th, 2024, all labs will be drawn in the cancer center.  You do not have to check in or register with the main entrance as you have in the past but will complete your check-in in the cancer center.  Wear comfortable clothing and clothing appropriate for easy access to any Portacath or PICC line.   We strive to give you quality time with your provider. You may need to reschedule your appointment if you arrive late (15 or more minutes).  Arriving late affects you and other patients whose appointments are after yours.  Also, if you miss three or more appointments without notifying the office, you may be dismissed from the clinic at the provider's discretion.      For prescription refill requests, have your pharmacy contact our office and allow 72 hours for refills to be completed.    Today you received the following chemotherapy and/or immunotherapy agents Stimufend.  Pegfilgrastim Injection What is this medication? PEGFILGRASTIM (PEG fil gra stim) lowers the risk of infection in people who are receiving chemotherapy. It works by Systems analyst make more white blood cells, which protects your body from infection. It may also be used to help people who have been exposed to high doses of radiation. This medicine may be used for other purposes; ask your health care provider or pharmacist if you have questions. COMMON BRAND NAME(S): Cherly Hensen, Neulasta, Nyvepria, Stimufend, UDENYCA, UDENYCA ONBODY, Ziextenzo What should I tell my care team before I take this medication? They need to know if you have any of these conditions: Kidney disease Latex allergy Ongoing radiation therapy Sickle cell disease Skin reactions to  acrylic adhesives (On-Body Injector only) An unusual or allergic reaction to pegfilgrastim, filgrastim, other medications, foods, dyes, or preservatives Pregnant or trying to get pregnant Breast-feeding How should I use this medication? This medication is for injection under the skin. If you get this medication at home, you will be taught how to prepare and give the pre-filled syringe or how to use the On-body Injector. Refer to the patient Instructions for Use for detailed instructions. Use exactly as directed. Tell your care team immediately if you suspect that the On-body Injector may not have performed as intended or if you suspect the use of the On-body Injector resulted in a missed or partial dose. It is important that you put your used needles and syringes in a special sharps container. Do not put them in a trash can. If you do not have a sharps container, call your pharmacist or care team to get one. Talk to your care team about the use of this medication in children. While this medication may be prescribed for selected conditions, precautions do apply. Overdosage: If you think you have taken too much of this medicine contact a poison control center or emergency room at once. NOTE: This medicine is only for you. Do not share this medicine with others. What if I miss a dose? It is important not to miss your dose. Call your care team if you miss your dose. If you miss a dose due to an On-body Injector failure or leakage, a new dose should be administered as soon as possible using a single prefilled syringe for  manual use. What may interact with this medication? Interactions have not been studied. This list may not describe all possible interactions. Give your health care provider a list of all the medicines, herbs, non-prescription drugs, or dietary supplements you use. Also tell them if you smoke, drink alcohol, or use illegal drugs. Some items may interact with your medicine. What should I  watch for while using this medication? Your condition will be monitored carefully while you are receiving this medication. You may need blood work done while you are taking this medication. Talk to your care team about your risk of cancer. You may be more at risk for certain types of cancer if you take this medication. If you are going to need a MRI, CT scan, or other procedure, tell your care team that you are using this medication (On-Body Injector only). What side effects may I notice from receiving this medication? Side effects that you should report to your care team as soon as possible: Allergic reactions--skin rash, itching, hives, swelling of the face, lips, tongue, or throat Capillary leak syndrome--stomach or muscle pain, unusual weakness or fatigue, feeling faint or lightheaded, decrease in the amount of urine, swelling of the ankles, hands, or feet, trouble breathing High white blood cell level--fever, fatigue, trouble breathing, night sweats, change in vision, weight loss Inflammation of the aorta--fever, fatigue, back, chest, or stomach pain, severe headache Kidney injury (glomerulonephritis)--decrease in the amount of urine, red or dark Joannah Gitlin urine, foamy or bubbly urine, swelling of the ankles, hands, or feet Shortness of breath or trouble breathing Spleen injury--pain in upper left stomach or shoulder Unusual bruising or bleeding Side effects that usually do not require medical attention (report to your care team if they continue or are bothersome): Bone pain Pain in the hands or feet This list may not describe all possible side effects. Call your doctor for medical advice about side effects. You may report side effects to FDA at 1-800-FDA-1088. Where should I keep my medication? Keep out of the reach of children. If you are using this medication at home, you will be instructed on how to store it. Throw away any unused medication after the expiration date on the label. NOTE:  This sheet is a summary. It may not cover all possible information. If you have questions about this medicine, talk to your doctor, pharmacist, or health care provider.  2024 Elsevier/Gold Standard (2021-01-18 00:00:00)        To help prevent nausea and vomiting after your treatment, we encourage you to take your nausea medication as directed.  BELOW ARE SYMPTOMS THAT SHOULD BE REPORTED IMMEDIATELY: *FEVER GREATER THAN 100.4 F (38 C) OR HIGHER *CHILLS OR SWEATING *NAUSEA AND VOMITING THAT IS NOT CONTROLLED WITH YOUR NAUSEA MEDICATION *UNUSUAL SHORTNESS OF BREATH *UNUSUAL BRUISING OR BLEEDING *URINARY PROBLEMS (pain or burning when urinating, or frequent urination) *BOWEL PROBLEMS (unusual diarrhea, constipation, pain near the anus) TENDERNESS IN MOUTH AND THROAT WITH OR WITHOUT PRESENCE OF ULCERS (sore throat, sores in mouth, or a toothache) UNUSUAL RASH, SWELLING OR PAIN  UNUSUAL VAGINAL DISCHARGE OR ITCHING   Items with * indicate a potential emergency and should be followed up as soon as possible or go to the Emergency Department if any problems should occur.  Please show the CHEMOTHERAPY ALERT CARD or IMMUNOTHERAPY ALERT CARD at check-in to the Emergency Department and triage nurse.  Should you have questions after your visit or need to cancel or reschedule your appointment, please contact [facility] [place] -  A DEPT OF Eligha Bridegroom [facility] [phone]  and follow the prompts.  Office hours are 8:00 a.m. to 4:30 p.m. Monday - Friday. Please note that voicemails left after 4:00 p.m. may not be returned until the following business day.  We are closed weekends and major holidays. You have access to a nurse at all times for urgent questions. Please call the main number to the clinic [phone] and follow the prompts.  For any non-urgent questions, you may also contact your provider using MyChart. We now offer e-Visits for anyone 41 and older to request care online for  non-urgent symptoms. For details visit [email or web address].   Also download the MyChart app! Go to the app store, search "MyChart", open the app, select [facility], and log in with your MyChart username and password.

## 2023-02-13 NOTE — Progress Notes (Signed)
Patient tolerated Stimufend injection with no complaints voiced.  Site clean and dry with no bruising or swelling noted.  No complaints of pain.  Discharged with vital signs stable and no signs or symptoms of distress noted.

## 2023-02-14 ENCOUNTER — Other Ambulatory Visit: Payer: Self-pay

## 2023-02-17 ENCOUNTER — Telehealth: Payer: Self-pay | Admitting: *Deleted

## 2023-02-17 NOTE — Telephone Encounter (Signed)
Received call from Sharion Balloon, NP with PCP office to advise of WBC 26.9 and UTI.  Currently treating with Macrobid 100 mg BID.  Patient was asymptomatic, however was advised to report to ER if she developed a fever due to current chemo regimen.

## 2023-02-26 ENCOUNTER — Other Ambulatory Visit: Payer: Self-pay | Admitting: *Deleted

## 2023-02-26 ENCOUNTER — Inpatient Hospital Stay: Payer: Medicare Other

## 2023-02-26 MED ORDER — MAGNESIUM OXIDE -MG SUPPLEMENT 400 (240 MG) MG PO TABS: 1.0000 | ORAL_TABLET | Freq: Two times a day (BID) | ORAL | 6 refills | Status: DC

## 2023-03-05 ENCOUNTER — Other Ambulatory Visit: Payer: Self-pay | Admitting: *Deleted

## 2023-03-10 ENCOUNTER — Ambulatory Visit: Payer: Medicare Other

## 2023-03-10 ENCOUNTER — Inpatient Hospital Stay: Payer: Medicare Other | Admitting: Hematology

## 2023-03-10 ENCOUNTER — Other Ambulatory Visit: Payer: Self-pay

## 2023-03-10 ENCOUNTER — Inpatient Hospital Stay: Payer: Medicare Other

## 2023-03-11 ENCOUNTER — Inpatient Hospital Stay: Payer: Medicare Other | Admitting: Hematology

## 2023-03-11 ENCOUNTER — Ambulatory Visit: Payer: Medicare Other

## 2023-03-11 ENCOUNTER — Inpatient Hospital Stay: Payer: Medicare Other

## 2023-03-12 ENCOUNTER — Inpatient Hospital Stay: Payer: Medicare Other | Attending: Hematology

## 2023-03-13 ENCOUNTER — Inpatient Hospital Stay: Payer: Medicare Other

## 2023-03-16 NOTE — Progress Notes (Signed)
 [facility] [address], [place] [address]    Clinic Day:  03/17/2023  Referring physician: Eloy Herring, MD  Patient Care Team: Eloy Herring, MD as PCP - General (Gynecologic Oncology) Rogers Hai, MD as Medical Oncologist (Medical Oncology) [name], RN as Oncology Nurse Navigator (Oncology) [name], [name], NP as Nurse Practitioner (Cardiothoracic Surgery)   ASSESSMENT & PLAN:   Assessment: 1. Stage IIIb high-grade serous ovarian carcinoma: - Presentation with abdominal distention and bloating sensation. - CT abdomen at outside hospital on 12/05/2020 with findings concerning for ovarian neoplasm. - CT chest without contrast on 12/18/2020 with scattered solid pulmonary nodules measuring up to 3 mm, indeterminate. - CA125 on 12/05/2020-10000 - 12/18/2020 exploratory laparotomy, bilateral salpingo-oophorectomy, infra gastric omentectomy, R0 primary tumor debulking by Dr. Eloy at [facility]. - Pathology-left ovary high-grade serous carcinoma, greatest dimension 26.5 cm, left ovarian capsule intact.  Fallopian tube surface involvement present, omentum involvement present, largest extrapelvic peritoneal focus-macroscopic, peritoneal/ascitic fluid involvement negative.  PT3BPNX.  FIGO stage IIIb. - She was evaluated for FLORA 5 clinical trial, found not to be a candidate. - Germline mutation testing was negative. - 6 cycles of carboplatin  and paclitaxel  from 02/11/2021 through 05/28/2021.  She has refused niraparib  maintenance. - CTAP on 11/10/2022 showed recurrence in the left pelvic sidewall. - She met with Dr. Eloy and felt not a candidate for debulking.  She is considered platinum sensitive. - Carboplatin , Doxil  started on 01/12/2023    2. Social/family history: - She is seen with her daughter-in-law today.  She lives by herself.  She ambulates with the help of cane.  She retired after working at [employer].  She is non-smoker. -  Brother had prostate cancer.  Another brother had throat cancer.  Sister had kidney cancer.  Grandson had bladder cancer at age 33.  Paternal uncle had head and neck cancer.    Plan: 1. Stage IIIb high-grade serous ovarian carcinoma: - Cycle 2 with dose reduction on 02/11/2023 with first dose of bevacizumab . - She reported tiredness has improved.  She had a couple of episodes when she had blood tinged mucus.  She vomited couple of times and Compazine  has helped. - Labs today: Normal LFTs and creatinine.  CBC grossly normal with hemoglobin 9.6. - Proceed with C3 today with Doxil  dose reduced to 22.5 mg/m and carboplatin  flat dose 400 mg with bevacizumab .  Will consider increasing bevacizumab  dose next time. - RTC 4 weeks for follow-up.  Plan on repeating scans after cycle 4.  2.  Hypomagnesemia: - She is taking magnesium  twice daily.  Magnesium  is 1.7 today.  Apparently it was not covered by her insurance and she had to buy over-the-counter.  Will increase magnesium  to once a day.  I have sent another prescription to [facility] in [place].   3.  Peripheral neuropathy: - Numbness in the feet is stable.  4.  High risk drug monitoring: - 2D echo on 01/07/2023: EF 65 to 70%.    No orders of the defined types were placed in this encounter.     [name],acting as a neurosurgeon for Hai Rogers, MD.,have documented all relevant documentation on the behalf of Hai Rogers, MD,as directed by  Hai Rogers, MD while in the presence of Hai Rogers, MD.  I, Hai Rogers MD, have reviewed the above documentation for accuracy and completeness, and I agree with the above.    Hai Rogers, MD   1/14/202510:05 AM  CHIEF COMPLAINT:  Diagnosis: ovarian cancer    Cancer Staging  Ovarian cancer on left [facility]) Staging form: Ovary, Fallopian Tube, and Primary Peritoneal Carcinoma, AJCC 8th Edition - Clinical stage from 01/30/2021: FIGO Stage  IIIB (cT3b, cNX, cM0) - Signed by Rogers Hai, MD on 01/30/2021    Prior Therapy: 1. Exploratory laparotomy, BSO, and infra-gastric omentectomy on 12/19/2020 with Dr. Eloy  2. Carboplatin  (AUC 6) / Paclitaxel  (175) q21d x 6 cycles 02/11/2021 - 05/28/2021   Current Therapy:  carboplatin  and Doxil  with bevacizumab     HISTORY OF PRESENT ILLNESS:   Oncology History  Ovarian cancer on left [facility])  01/07/2021 Initial Diagnosis   Ovarian cancer on left [facility])   01/30/2021 Cancer Staging   Staging form: Ovary, Fallopian Tube, and Primary Peritoneal Carcinoma, AJCC 8th Edition - Clinical stage from 01/30/2021: FIGO Stage IIIB (cT3b, cNX, cM0) - Signed by Rogers Hai, MD on 01/30/2021 Histologic grade (G): G3 Histologic grading system: 4 grade system   02/11/2021 - 05/28/2021 Chemotherapy   Patient is on Treatment Plan : OVARIAN Carboplatin  (AUC 6) / Paclitaxel  (175) q21d x 6 cycles     01/12/2023 -  Chemotherapy   Patient is on Treatment Plan : OVARIAN Liposomal Doxorubicin  D1 + Carboplatin  D1 + Bevacizumab  D1,15 q28d / Bevacizumab  q21d        INTERVAL HISTORY:   Rebecca [name] is a 78 y.o. female presenting to clinic today for follow up of ovarian cancer. She was last seen by me on 02/11/23.  Today, she states that she is doing well overall. Her appetite level is at 100%. Her energy level is at 75%. She is accompanied by her husband.   She tolerated last cycle of treatment well after dosage reduction and aches/pains have improved. She had two episodes of vomiting that resolved after taking compazine . She notes multiple episodes of mild epistaxis when blowing her nose. Numbness in feet is stable. She denies dyspnea when lying on her back, though she has dyspnea on exertion. She reports hernia in abdomen makes it difficult for to bend. She is taking magnesium  BID.   PAST MEDICAL HISTORY:   Past Medical History: Past Medical History:  Diagnosis Date   Anemia    CAD (coronary  artery disease)    DM2 (diabetes mellitus, type 2) (HCC)    GERD (gastroesophageal reflux disease)    HTN (hypertension)    Ovarian ca (HCC)    Port-A-Cath in place 02/02/2021    Surgical History: Past Surgical History:  Procedure Laterality Date   IR IMAGING GUIDED PORT INSERTION  02/08/2021    Social History: Social History   Socioeconomic History   Marital status: Single    Spouse name: Not on file   Number of children: Not on file   Years of education: Not on file   Highest education level: Not on file  Occupational History   Not on file  Tobacco Use   Smoking status: Not on file   Smokeless tobacco: Not on file  Substance and Sexual Activity   Alcohol use: Not on file   Drug use: Not on file   Sexual activity: Not on file  Other Topics Concern   Not on file  Social History Narrative   Not on file   Social Drivers of Health   Financial Resource Strain: Low Risk  (02/05/2021)   Overall Financial Resource Strain (CARDIA)    Difficulty of Paying Living Expenses: Not hard at all  Food Insecurity: No Food Insecurity (02/05/2021)   Hunger Vital Sign  Worried About Programme Researcher, Broadcasting/film/video in the Last Year: Never true    [employer] of Food in the Last Year: Never true  Transportation Needs: Unmet Transportation Needs (02/05/2021)   PRAPARE - Administrator, Civil Service (Medical): Yes    Lack of Transportation (Non-Medical): No  Physical Activity: Not on file  Stress: Stress Concern Present (02/05/2021)   [employer] of Occupational Health - Occupational Stress Questionnaire    Feeling of Stress : To some extent  Social Connections: Moderately Isolated (02/05/2021)   Social Connection and Isolation Panel [NHANES]    Frequency of Communication with Friends and Family: More than three times a week    Frequency of Social Gatherings with Friends and Family: More than three times a week    Attends Religious Services: More than 4 times per year    Active Member  of [employer] or Organizations: No    Attends Banker Meetings: Never    Marital Status: Widowed  Intimate Partner Violence: Not on file    Family History: No family history on file.  Current Medications:  Current Outpatient Medications:    acetaminophen  (TYLENOL ) 325 MG tablet, Take 3 tablets (975 mg total) by mouth every 6 (six) hours as needed for moderate pain., Disp: 360 tablet, Rfl: 2   atorvastatin  (LIPITOR) 40 MG tablet, Take 40 mg by mouth at bedtime., Disp: , Rfl:    azelastine (ASTELIN) 0.1 % nasal spray, Place 1 spray into both nostrils 2 (two) times daily as needed for allergies., Disp: , Rfl:    B-D ULTRAFINE III SHORT PEN 31G X 8 MM MISC, SMARTSIG:1 Each SUB-Q Daily, Disp: , Rfl:    baclofen (LIORESAL) 10 MG tablet, Take 10 mg by mouth 3 (three) times daily as needed for muscle spasms., Disp: , Rfl:    clopidogrel  (PLAVIX ) 75 MG tablet, Take 75 mg by mouth daily., Disp: , Rfl:    diphenhydrAMINE -zinc acetate (BENADRYL ) cream, Apply 1 application topically 3 (three) times daily as needed for itching., Disp: , Rfl:    docusate sodium  (COLACE) 100 MG capsule, Take 1 capsule (100 mg total) by mouth daily as needed for mild constipation., Disp: 30 capsule, Rfl: 1   ferrous sulfate 325 (65 FE) MG tablet, Take 325 mg by mouth every evening., Disp: , Rfl:    furosemide  (LASIX ) 20 MG tablet, Take 1 tablet (20 mg total) by mouth in the morning., Disp: 30 tablet, Rfl: 2   gabapentin  (NEURONTIN ) 300 MG capsule, Take 1 capsule (300 mg total) by mouth 3 (three) times daily., Disp: 60 capsule, Rfl: 2   HYDROcodone -acetaminophen  (NORCO/VICODIN) 5-325 MG tablet, Take 1 tablet by mouth every 6 (six) hours as needed for moderate pain., Disp: 60 tablet, Rfl: 0   lidocaine -prilocaine  (EMLA ) cream, Apply to affected area once, Disp: 30 g, Rfl: 3   losartan  (COZAAR ) 50 MG tablet, Take 50 mg by mouth at bedtime., Disp: , Rfl:    Misc. Devices MISC, Please provide patient with 3 diabetic  nutritional supplements per day., Disp: 1 each, Rfl: 11   nitrofurantoin , macrocrystal-monohydrate, (MACROBID ) 100 MG capsule, Take 100 mg by mouth 2 (two) times daily., Disp: , Rfl:    pantoprazole  (PROTONIX ) 20 MG tablet, Take 20 mg by mouth daily., Disp: , Rfl:    predniSONE  (DELTASONE ) 50 MG tablet, Take 13hrs, 7hrs and 1hr before port placementTake 13hrs, 7hrs and 1hr before port placement, Disp: 3 tablet, Rfl: 0   prochlorperazine  (COMPAZINE ) 10 MG tablet, Take  1 tablet (10 mg total) by mouth every 6 (six) hours as needed for nausea or vomiting., Disp: 60 tablet, Rfl: 3   SOLIQUA 100-33 UNT-MCG/ML SOPN, Inject 45 Units into the skin daily., Disp: , Rfl:    Tetrahydrozoline HCl (REDNESS RELIEVER EYE DROPS OP), Place 1 drop into both eyes daily as needed (redness)., Disp: , Rfl:    magnesium  oxide (MAG-OX) 400 (240 Mg) MG tablet, Take 1 tablet (400 mg total) by mouth 2 (two) times daily., Disp: 60 tablet, Rfl: 6 No current facility-administered medications for this visit.  Facility-Administered Medications Ordered in Other Visits:    0.9 %  sodium chloride  infusion, , Intravenous, Continuous, Marthe Dant, MD   CARBOplatin  (PARAPLATIN ) 400 mg in sodium chloride  0.9 % 250 mL chemo infusion, 400 mg, Intravenous, Once, Rogers Hai, MD   cetirizine  (QUZYTTIR ) injection 10 mg, 10 mg, Intravenous, Once, Rogers Hai, MD   dexamethasone  (DECADRON ) injection 10 mg, 10 mg, Intravenous, Once, Rogers Hai, MD   dextrose  5 % solution, , Intravenous, Continuous, Rogers Hai, MD   DOXOrubicin  HCL LIPOSOMAL (DOXIL ) 50 mg in dextrose  5 % 250 mL chemo infusion, 22.5 mg/m2 (Order-Specific), Intravenous, Once, Jaree Trinka, MD   famotidine  (PEPCID ) IVPB 20 mg premix, 20 mg, Intravenous, Once, Rogers Hai, MD   fosaprepitant  (EMEND) 150 mg in sodium chloride  0.9 % 145 mL IVPB, 150 mg, Intravenous, Once, Rogers Hai, MD   heparin  lock flush 100  unit/mL, 500 Units, Intracatheter, Once PRN, Ellizabeth Dacruz, MD   palonosetron  (ALOXI ) injection 0.25 mg, 0.25 mg, Intravenous, Once, Rogers Hai, MD   sodium chloride  flush (NS) 0.9 % injection 10 mL, 10 mL, Intracatheter, PRN, Adonte Vanriper, MD   Allergies: Allergies  Allergen Reactions   Aspirin Hives   Mometasone Itching   Naproxen Sodium Itching   Penicillin G Swelling   Triamcinolone Itching   Codeine Palpitations   Ibuprofen Rash   Iodinated Contrast Media Itching and Rash    REVIEW OF SYSTEMS:   Review of Systems  Constitutional:  Negative for chills, fatigue and fever.  HENT:   Negative for lump/mass, mouth sores, nosebleeds, sore throat and trouble swallowing.   Eyes:  Negative for eye problems.  Respiratory:  Positive for cough and shortness of breath.   Cardiovascular:  Positive for chest pain (5/10 severity). Negative for leg swelling and palpitations.  Gastrointestinal:  Positive for abdominal pain (left side, 5/10 severity) and constipation. Negative for diarrhea, nausea and vomiting.  Genitourinary:  Negative for bladder incontinence, difficulty urinating, dysuria, frequency, hematuria and nocturia.   Musculoskeletal:  Positive for back pain (5/10 severity). Negative for arthralgias, flank pain, myalgias and neck pain.  Skin:  Negative for itching and rash.  Neurological:  Negative for dizziness, headaches and numbness.       +tingling feet  Hematological:  Does not bruise/bleed easily.  Psychiatric/Behavioral:  Positive for sleep disturbance. Negative for depression and suicidal ideas. The patient is not nervous/anxious.   All other systems reviewed and are negative.    VITALS:   Weight 213 lb 13.5 oz (97 kg).  Wt Readings from Last 3 Encounters:  03/17/23 213 lb 13.5 oz (97 kg)  02/11/23 212 lb (96.2 kg)  02/11/23 212 lb (96.2 kg)    Body mass index is 37.88 kg/m.  Performance status (ECOG): 1 - Symptomatic but completely  ambulatory  PHYSICAL EXAM:   Physical Exam Vitals and nursing note reviewed. Exam conducted with a chaperone present.  Constitutional:      Appearance: Normal  appearance.  Cardiovascular:     Rate and Rhythm: Normal rate and regular rhythm.     Pulses: Normal pulses.     Heart sounds: Normal heart sounds.  Pulmonary:     Effort: Pulmonary effort is normal.     Breath sounds: Normal breath sounds.  Abdominal:     Palpations: Abdomen is soft. There is no hepatomegaly, splenomegaly or mass.     Tenderness: There is no abdominal tenderness.  Musculoskeletal:     Right lower leg: No edema.     Left lower leg: No edema.  Lymphadenopathy:     Cervical: No cervical adenopathy.     Right cervical: No superficial, deep or posterior cervical adenopathy.    Left cervical: No superficial, deep or posterior cervical adenopathy.     Upper Body:     Right upper body: No supraclavicular or axillary adenopathy.     Left upper body: No supraclavicular or axillary adenopathy.  Neurological:     General: No focal deficit present.     Mental Status: She is alert and oriented to person, place, and time.  Psychiatric:        Mood and Affect: Mood normal.        Behavior: Behavior normal.     LABS:      Latest Ref Rng & Units 03/17/2023    8:33 AM 02/11/2023    8:22 AM 01/12/2023    9:15 AM  CBC  WBC 4.0 - 10.5 K/uL 7.4  7.5  7.6   Hemoglobin 12.0 - 15.0 g/dL 9.6  9.8  88.9   Hematocrit 36.0 - 46.0 % 28.3  28.9  33.1   Platelets 150 - 400 K/uL 331  348  283       Latest Ref Rng & Units 03/17/2023    8:33 AM 02/11/2023    8:22 AM 01/12/2023    9:15 AM  CMP  Glucose 70 - 99 mg/dL 778  826  835   BUN 8 - 23 mg/dL 22  16  15    Creatinine 0.44 - 1.00 mg/dL 8.86  8.86  8.87   Sodium 135 - 145 mmol/L 137  135  135   Potassium 3.5 - 5.1 mmol/L 3.7  4.0  3.5   Chloride 98 - 111 mmol/L 104  102  100   CO2 22 - 32 mmol/L 23  23  27    Calcium  8.9 - 10.3 mg/dL 9.0  9.3  9.0   Total Protein  6.5 - 8.1 g/dL 6.6  7.0  7.2   Total Bilirubin 0.0 - 1.2 mg/dL 0.6  0.4  0.5   Alkaline Phos 38 - 126 U/L 103  83  95   AST 15 - 41 U/L 16  17  14    ALT 0 - 44 U/L 17  18  17       No results found for: CEA1, CEA / No results found for: CEA1, CEA No results found for: PSA1 No results found for: CAN199 Lab Results  Component Value Date   CAN125 13.7 11/06/2022    No results found for: STEPHANY CARLOTA BENSON MARKEL EARLA JOANNIE DOC, MSPIKE, SPEI Lab Results  Component Value Date   TIBC 343 11/06/2022   TIBC 365 06/24/2022   FERRITIN 301 11/06/2022   FERRITIN 186 06/24/2022   IRONPCTSAT 19 11/06/2022   IRONPCTSAT 19 06/24/2022   Lab Results  Component Value Date   LDH 119 02/11/2021     STUDIES:   No results found.

## 2023-03-17 ENCOUNTER — Inpatient Hospital Stay: Payer: Medicare Other | Attending: Hematology

## 2023-03-17 ENCOUNTER — Inpatient Hospital Stay (HOSPITAL_BASED_OUTPATIENT_CLINIC_OR_DEPARTMENT_OTHER): Payer: Medicare Other | Admitting: Hematology

## 2023-03-17 ENCOUNTER — Inpatient Hospital Stay: Payer: Medicare Other

## 2023-03-17 ENCOUNTER — Telehealth: Payer: Self-pay | Admitting: *Deleted

## 2023-03-17 VITALS — BP 160/73 | HR 103 | Temp 98.0°F | Resp 19

## 2023-03-17 VITALS — Wt 213.8 lb

## 2023-03-17 DIAGNOSIS — Z9079 Acquired absence of other genital organ(s): Secondary | ICD-10-CM | POA: Insufficient documentation

## 2023-03-17 DIAGNOSIS — Z5111 Encounter for antineoplastic chemotherapy: Secondary | ICD-10-CM | POA: Insufficient documentation

## 2023-03-17 DIAGNOSIS — R300 Dysuria: Secondary | ICD-10-CM

## 2023-03-17 DIAGNOSIS — Z5189 Encounter for other specified aftercare: Secondary | ICD-10-CM | POA: Insufficient documentation

## 2023-03-17 DIAGNOSIS — Z90722 Acquired absence of ovaries, bilateral: Secondary | ICD-10-CM | POA: Insufficient documentation

## 2023-03-17 DIAGNOSIS — C562 Malignant neoplasm of left ovary: Secondary | ICD-10-CM

## 2023-03-17 DIAGNOSIS — Z5112 Encounter for antineoplastic immunotherapy: Secondary | ICD-10-CM | POA: Insufficient documentation

## 2023-03-17 DIAGNOSIS — N390 Urinary tract infection, site not specified: Secondary | ICD-10-CM | POA: Insufficient documentation

## 2023-03-17 DIAGNOSIS — Z79899 Other long term (current) drug therapy: Secondary | ICD-10-CM | POA: Diagnosis not present

## 2023-03-17 LAB — CBC WITH DIFFERENTIAL/PLATELET
Abs Immature Granulocytes: 0.04 10*3/uL (ref 0.00–0.07)
Basophils Absolute: 0 10*3/uL (ref 0.0–0.1)
Basophils Relative: 0 %
Eosinophils Absolute: 0.1 10*3/uL (ref 0.0–0.5)
Eosinophils Relative: 1 %
HCT: 28.3 % — ABNORMAL LOW (ref 36.0–46.0)
Hemoglobin: 9.6 g/dL — ABNORMAL LOW (ref 12.0–15.0)
Immature Granulocytes: 1 %
Lymphocytes Relative: 19 %
Lymphs Abs: 1.4 10*3/uL (ref 0.7–4.0)
MCH: 33.3 pg (ref 26.0–34.0)
MCHC: 33.9 g/dL (ref 30.0–36.0)
MCV: 98.3 fL (ref 80.0–100.0)
Monocytes Absolute: 0.7 10*3/uL (ref 0.1–1.0)
Monocytes Relative: 9 %
Neutro Abs: 5.2 10*3/uL (ref 1.7–7.7)
Neutrophils Relative %: 70 %
Platelets: 331 10*3/uL (ref 150–400)
RBC: 2.88 MIL/uL — ABNORMAL LOW (ref 3.87–5.11)
RDW: 16.1 % — ABNORMAL HIGH (ref 11.5–15.5)
WBC: 7.4 10*3/uL (ref 4.0–10.5)
nRBC: 0.5 % — ABNORMAL HIGH (ref 0.0–0.2)

## 2023-03-17 LAB — URINALYSIS, DIPSTICK ONLY
Bilirubin Urine: NEGATIVE
Glucose, UA: NEGATIVE mg/dL
Hgb urine dipstick: NEGATIVE
Ketones, ur: NEGATIVE mg/dL
Nitrite: POSITIVE — AB
Protein, ur: NEGATIVE mg/dL
Specific Gravity, Urine: 1.006 (ref 1.005–1.030)
pH: 5 (ref 5.0–8.0)

## 2023-03-17 LAB — COMPREHENSIVE METABOLIC PANEL
ALT: 17 U/L (ref 0–44)
AST: 16 U/L (ref 15–41)
Albumin: 3.5 g/dL (ref 3.5–5.0)
Alkaline Phosphatase: 103 U/L (ref 38–126)
Anion gap: 10 (ref 5–15)
BUN: 22 mg/dL (ref 8–23)
CO2: 23 mmol/L (ref 22–32)
Calcium: 9 mg/dL (ref 8.9–10.3)
Chloride: 104 mmol/L (ref 98–111)
Creatinine, Ser: 1.13 mg/dL — ABNORMAL HIGH (ref 0.44–1.00)
GFR, Estimated: 51 mL/min — ABNORMAL LOW (ref >60)
Glucose, Bld: 221 mg/dL — ABNORMAL HIGH (ref 70–99)
Potassium: 3.7 mmol/L (ref 3.5–5.1)
Sodium: 137 mmol/L (ref 135–145)
Total Bilirubin: 0.6 mg/dL (ref 0.0–1.2)
Total Protein: 6.6 g/dL (ref 6.5–8.1)

## 2023-03-17 LAB — MAGNESIUM: Magnesium: 1.7 mg/dL (ref 1.7–2.4)

## 2023-03-17 MED ORDER — MAGNESIUM OXIDE -MG SUPPLEMENT 400 (240 MG) MG PO TABS: 1.0000 | ORAL_TABLET | Freq: Two times a day (BID) | ORAL | 6 refills | Status: AC

## 2023-03-17 MED ORDER — DOXORUBICIN HCL LIPOSOMAL CHEMO INJECTION 2 MG/ML
22.5000 mg/m2 | Freq: Once | INTRAVENOUS | Status: AC
  Administered 2023-03-17: 50 mg via INTRAVENOUS
  Filled 2023-03-17: qty 25

## 2023-03-17 MED ORDER — ACETAMINOPHEN 325 MG PO TABS
650.0000 mg | ORAL_TABLET | Freq: Once | ORAL | Status: AC
  Administered 2023-03-17: 650 mg via ORAL
  Filled 2023-03-17: qty 2

## 2023-03-17 MED ORDER — SODIUM CHLORIDE 0.9% FLUSH
10.0000 mL | INTRAVENOUS | Status: DC | PRN
  Administered 2023-03-17: 10 mL

## 2023-03-17 MED ORDER — SODIUM CHLORIDE 0.9 % IV SOLN: INTRAVENOUS | Status: DC

## 2023-03-17 MED ORDER — SODIUM CHLORIDE 0.9 % IV SOLN
400.0000 mg | Freq: Once | INTRAVENOUS | Status: AC
  Administered 2023-03-17: 400 mg via INTRAVENOUS
  Filled 2023-03-17: qty 40

## 2023-03-17 MED ORDER — PALONOSETRON HCL INJECTION 0.25 MG/5ML
0.2500 mg | Freq: Once | INTRAVENOUS | Status: AC
Start: 2023-03-17 — End: 2023-03-17
  Administered 2023-03-17: 0.25 mg via INTRAVENOUS
  Filled 2023-03-17: qty 5

## 2023-03-17 MED ORDER — HEPARIN SOD (PORK) LOCK FLUSH 100 UNIT/ML IV SOLN
500.0000 [IU] | Freq: Once | INTRAVENOUS | Status: AC | PRN
  Administered 2023-03-17: 500 [IU]

## 2023-03-17 MED ORDER — CIPROFLOXACIN HCL 500 MG PO TABS: 500.0000 mg | ORAL_TABLET | Freq: Two times a day (BID) | ORAL | 0 refills | Status: DC

## 2023-03-17 MED ORDER — CETIRIZINE HCL 10 MG/ML IV SOLN
10.0000 mg | Freq: Once | INTRAVENOUS | Status: AC
  Administered 2023-03-17: 10 mg via INTRAVENOUS
  Filled 2023-03-17: qty 1

## 2023-03-17 MED ORDER — SODIUM CHLORIDE 0.9 % IV SOLN
10.0000 mg/kg | Freq: Once | INTRAVENOUS | Status: AC
  Administered 2023-03-17: 1000 mg via INTRAVENOUS
  Filled 2023-03-17: qty 32

## 2023-03-17 MED ORDER — FAMOTIDINE IN NACL 20-0.9 MG/50ML-% IV SOLN
20.0000 mg | Freq: Once | INTRAVENOUS | Status: AC
  Administered 2023-03-17: 20 mg via INTRAVENOUS
  Filled 2023-03-17: qty 50

## 2023-03-17 MED ORDER — SODIUM CHLORIDE FLUSH 0.9 % IV SOLN
10.0000 mL | Freq: Once | INTRAVENOUS | Status: AC
  Administered 2023-03-17: 10 mL via INTRAVENOUS
  Filled 2023-03-17: qty 10

## 2023-03-17 MED ORDER — FOSAPREPITANT DIMEGLUMINE INJECTION 150 MG
150.0000 mg | Freq: Once | INTRAVENOUS | Status: AC
  Administered 2023-03-17: 150 mg via INTRAVENOUS
  Filled 2023-03-17: qty 150

## 2023-03-17 MED ORDER — DEXAMETHASONE SODIUM PHOSPHATE 10 MG/ML IJ SOLN
10.0000 mg | Freq: Once | INTRAMUSCULAR | Status: AC
Start: 2023-03-17 — End: 2023-03-17
  Administered 2023-03-17: 10 mg via INTRAVENOUS
  Filled 2023-03-17: qty 1

## 2023-03-17 MED ORDER — DEXTROSE 5 % IV SOLN
INTRAVENOUS | Status: DC
Start: 2023-03-17 — End: 2023-03-17

## 2023-03-17 NOTE — Patient Instructions (Signed)
 [facility] - A DEPT OF [place]  Discharge Instructions: Thank you for choosing [facility] to provide your oncology and hematology care.  If you have a lab appointment with the Cancer Center - please note that after April 8th, 2024, all labs will be drawn in the cancer center.  You do not have to check in or register with the main entrance as you have in the past but will complete your check-in in the cancer center.  Wear comfortable clothing and clothing appropriate for easy access to any Portacath or PICC line.   We strive to give you quality time with your provider. You may need to reschedule your appointment if you arrive late (15 or more minutes).  Arriving late affects you and other patients whose appointments are after yours.  Also, if you miss three or more appointments without notifying the office, you may be dismissed from the clinic at the provider's discretion.      For prescription refill requests, have your pharmacy contact our office and allow 72 hours for refills to be completed.    Today you received the following chemotherapy and/or immunotherapy agents Avastin , Doxil , Carboplatin . Carboplatin  Injection What is this medication? CARBOPLATIN  (KAR boe pla tin) treats some types of cancer. It works by slowing down the growth of cancer cells. This medicine may be used for other purposes; ask your health care provider or pharmacist if you have questions. COMMON BRAND NAME(S): Paraplatin  What should I tell my care team before I take this medication? They need to know if you have any of these conditions: Blood disorders Hearing problems Kidney disease Recent or ongoing radiation therapy An unusual or allergic reaction to carboplatin , cisplatin, other medications, foods, dyes, or preservatives Pregnant or trying to get pregnant Breast-feeding How should I use this medication? This medication is injected into a vein. It is given by  your care team in a hospital or clinic setting. Talk to your care team about the use of this medication in children. Special care may be needed. Overdosage: If you think you have taken too much of this medicine contact a poison control center or emergency room at once. NOTE: This medicine is only for you. Do not share this medicine with others. What if I miss a dose? Keep appointments for follow-up doses. It is important not to miss your dose. Call your care team if you are unable to keep an appointment. What may interact with this medication? Medications for seizures Some antibiotics, such as amikacin, gentamicin, neomycin, streptomycin, tobramycin Vaccines This list may not describe all possible interactions. Give your health care provider a list of all the medicines, herbs, non-prescription drugs, or dietary supplements you use. Also tell them if you smoke, drink alcohol, or use illegal drugs. Some items may interact with your medicine. What should I watch for while using this medication? Your condition will be monitored carefully while you are receiving this medication. You may need blood work while taking this medication. This medication may make you feel generally unwell. This is not uncommon, as chemotherapy can affect healthy cells as well as cancer cells. Report any side effects. Continue your course of treatment even though you feel ill unless your care team tells you to stop. In some cases, you may be given additional medications to help with side effects. Follow all directions for their use. This medication may increase your risk of getting an infection. Call your care team for advice  if you get a fever, chills, sore throat, or other symptoms of a cold or flu. Do not treat yourself. Try to avoid being around people who are sick. Avoid taking medications that contain aspirin, acetaminophen , ibuprofen, naproxen, or ketoprofen unless instructed by your care team. These medications may hide a  fever. Be careful brushing or flossing your teeth or using a toothpick because you may get an infection or bleed more easily. If you have any dental work done, tell your dentist you are receiving this medication. Talk to your care team if you wish to become pregnant or think you might be pregnant. This medication can cause serious birth defects. Talk to your care team about effective forms of contraception. Do not breast-feed while taking this medication. What side effects may I notice from receiving this medication? Side effects that you should report to your care team as soon as possible: Allergic reactions--skin rash, itching, hives, swelling of the face, lips, tongue, or throat Infection--fever, chills, cough, sore throat, wounds that don't heal, pain or trouble when passing urine, general feeling of discomfort or being unwell Low red blood cell level--unusual weakness or fatigue, dizziness, headache, trouble breathing Pain, tingling, or numbness in the hands or feet, muscle weakness, change in vision, confusion or trouble speaking, loss of balance or coordination, trouble walking, seizures Unusual bruising or bleeding Side effects that usually do not require medical attention (report to your care team if they continue or are bothersome): Hair loss Nausea Unusual weakness or fatigue Vomiting This list may not describe all possible side effects. Call your doctor for medical advice about side effects. You may report side effects to FDA at 1-800-FDA-1088. Where should I keep my medication? This medication is given in a hospital or clinic. It will not be stored at home. NOTE: This sheet is a summary. It may not cover all possible information. If you have questions about this medicine, talk to your doctor, pharmacist, or health care provider.  2024 Elsevier/Gold Standard (2021-06-11 00:00:00) Doxorubicin  Liposomal Injection What is this medication? DOXORUBICIN  LIPOSOMAL (dox oh ROO bi sin LIP  oh som al) treats some types of cancer. It works by slowing down the growth of cancer cells. This medicine may be used for other purposes; ask your health care provider or pharmacist if you have questions. COMMON BRAND NAME(S): Doxil , Lipodox What should I tell my care team before I take this medication? They need to know if you have any of these conditions: Blood disorder Heart disease Infection especially a viral infection, such as chickenpox, cold sores, herpes Liver disease Recent or ongoing radiation An unusual or allergic reaction to doxorubicin , soybeans, other medications, foods, dyes, or preservatives If you or your partner are pregnant or trying to get pregnant Breast-feeding How should I use this medication? This medication is injected into a vein. It is given by your care team in a hospital or clinic setting. Talk to your care team about the use of this medication in children. Special care may be needed. Overdosage: If you think you have taken too much of this medicine contact a poison control center or emergency room at once. NOTE: This medicine is only for you. Do not share this medicine with others. What if I miss a dose? Keep appointments for follow-up doses. It is important not to miss your dose. Call your care team if you are unable to keep an appointment. What may interact with this medication? Do not take this medication with any of the following: Zidovudine  This medication may also interact with the following: Medications to increase blood counts, such as filgrastim, pegfilgrastim , sargramostim Vaccines This list may not describe all possible interactions. Give your health care provider a list of all the medicines, herbs, non-prescription drugs, or dietary supplements you use. Also tell them if you smoke, drink alcohol, or use illegal drugs. Some items may interact with your medicine. What should I watch for while using this medication? Your condition will be  monitored carefully while you are receiving this medication. You may need blood work while taking this medication. This medication may make you feel generally unwell. This is not uncommon as chemotherapy can affect healthy cells as well as cancer cells. Report any side effects. Continue your course of treatment even though you feel ill unless your care team tells you to stop. Your urine may turn orange-red for a few days after your dose. This is not blood. If your urine is dark or brown, call your care team. In some cases, you may be given additional medications to help with side effects. Follow all directions for their use. Talk to your care team about your risk of cancer. You may be more at risk for certain types of cancers if you take this medication. Talk to your care team if you or your partner may be pregnant. Serious birth defects can occur if you take this medication during pregnancy and for 6 months after the last dose. Contraception is recommended while taking this medication and for 6 months after the last dose. Your care team can help you find the option that works for you. If your partner can get pregnant, use a condom while taking this medication and for 6 months after the last dose. Do not breastfeed while taking this medication. This medication may cause infertility. Talk to your care team if you are concerned about your fertility. What side effects may I notice from receiving this medication? Side effects that you should report to your care team as soon as possible: Allergic reactions--skin rash, itching, hives, swelling of the face, lips, tongue, or throat Heart failure--shortness of breath, swelling of the ankles, feet, or hands, sudden weight gain, unusual weakness or fatigue Infection--fever, chills, cough, sore throat, wounds that don't heal, pain or trouble when passing urine, general feeling of discomfort or being unwell Infusion reactions--chest pain, shortness of breath or  trouble breathing, feeling faint or lightheaded Low red blood cell level--unusual weakness or fatigue, dizziness, headache, trouble breathing Redness, swelling, and blistering of the skin over hands and feet Unusual bruising or bleeding Side effects that usually do not require medical attention (report to your care team if they continue or are bothersome): Constipation Diarrhea Loss of appetite Nausea Pain, redness, or swelling with sores inside the mouth or throat Red urine Unusual weakness or fatigue This list may not describe all possible side effects. Call your doctor for medical advice about side effects. You may report side effects to FDA at 1-800-FDA-1088. Where should I keep my medication? This medication is given in a hospital or clinic. It will not be stored at home. NOTE: This sheet is a summary. It may not cover all possible information. If you have questions about this medicine, talk to your doctor, pharmacist, or health care provider.  2024 Elsevier/Gold Standard (2022-05-22 00:00:00) Bevacizumab  Injection What is this medication? BEVACIZUMAB  (be va SIZ yoo mab) treats some types of cancer. It works by blocking a protein that causes cancer cells to grow and multiply. This helps to slow  or stop the spread of cancer cells. It is a monoclonal antibody. This medicine may be used for other purposes; ask your health care provider or pharmacist if you have questions. COMMON BRAND NAME(S): Alymsys , Avastin , MVASI , Zirabev  What should I tell my care team before I take this medication? They need to know if you have any of these conditions: Blood clots Coughing up blood Having or recent surgery Heart failure High blood pressure History of a connection between 2 or more body parts that do not usually connect (fistula) History of a tear in your stomach or intestines Protein in your urine An unusual or allergic reaction to bevacizumab , other medications, foods, dyes, or  preservatives Pregnant or trying to get pregnant Breast-feeding How should I use this medication? This medication is injected into a vein. It is given by your care team in a hospital or clinic setting. Talk to your care team the use of this medication in children. Special care may be needed. Overdosage: If you think you have taken too much of this medicine contact a poison control center or emergency room at once. NOTE: This medicine is only for you. Do not share this medicine with others. What if I miss a dose? Keep appointments for follow-up doses. It is important not to miss your dose. Call your care team if you are unable to keep an appointment. What may interact with this medication? Interactions are not expected. This list may not describe all possible interactions. Give your health care provider a list of all the medicines, herbs, non-prescription drugs, or dietary supplements you use. Also tell them if you smoke, drink alcohol, or use illegal drugs. Some items may interact with your medicine. What should I watch for while using this medication? Your condition will be monitored carefully while you are receiving this medication. You may need blood work while taking this medication. This medication may make you feel generally unwell. This is not uncommon as chemotherapy can affect healthy cells as well as cancer cells. Report any side effects. Continue your course of treatment even though you feel ill unless your care team tells you to stop. This medication may increase your risk to bruise or bleed. Call your care team if you notice any unusual bleeding. Before having surgery, talk to your care team to make sure it is ok. This medication can increase the risk of poor healing of your surgical site or wound. You will need to stop this medication for 28 days before surgery. After surgery, wait at least 28 days before restarting this medication. Make sure the surgical site or wound is healed enough  before restarting this medication. Talk to your care team if questions. Talk to your care team if you may be pregnant. Serious birth defects can occur if you take this medication during pregnancy and for 6 months after the last dose. Contraception is recommended while taking this medication and for 6 months after the last dose. Your care team can help you find the option that works for you. Do not breastfeed while taking this medication and for 6 months after the last dose. This medication can cause infertility. Talk to your care team if you are concerned about your fertility. What side effects may I notice from receiving this medication? Side effects that you should report to your care team as soon as possible: Allergic reactions--skin rash, itching, hives, swelling of the face, lips, tongue, or throat Bleeding--bloody or black, tar-like stools, vomiting blood or brown material that looks like  coffee grounds, red or dark brown urine, small red or purple spots on skin, unusual bruising or bleeding Blood clot--pain, swelling, or warmth in the leg, shortness of breath, chest pain Heart attack--pain or tightness in the chest, shoulders, arms, or jaw, nausea, shortness of breath, cold or clammy skin, feeling faint or lightheaded Heart failure--shortness of breath, swelling of the ankles, feet, or hands, sudden weight gain, unusual weakness or fatigue Increase in blood pressure Infection--fever, chills, cough, sore throat, wounds that don't heal, pain or trouble when passing urine, general feeling of discomfort or being unwell Infusion reactions--chest pain, shortness of breath or trouble breathing, feeling faint or lightheaded Kidney injury--decrease in the amount of urine, swelling of the ankles, hands, or feet Stomach pain that is severe, does not go away, or gets worse Stroke--sudden numbness or weakness of the face, arm, or leg, trouble speaking, confusion, trouble walking, loss of balance or  coordination, dizziness, severe headache, change in vision Sudden and severe headache, confusion, change in vision, seizures, which may be signs of posterior reversible encephalopathy syndrome (PRES) Side effects that usually do not require medical attention (report to your care team if they continue or are bothersome): Back pain Change in taste Diarrhea Dry skin Increased tears Nosebleed This list may not describe all possible side effects. Call your doctor for medical advice about side effects. You may report side effects to FDA at 1-800-FDA-1088. Where should I keep my medication? This medication is given in a hospital or clinic. It will not be stored at home. NOTE: This sheet is a summary. It may not cover all possible information. If you have questions about this medicine, talk to your doctor, pharmacist, or health care provider.  2024 Elsevier/Gold Standard (2021-07-05 00:00:00)       To help prevent nausea and vomiting after your treatment, we encourage you to take your nausea medication as directed.  BELOW ARE SYMPTOMS THAT SHOULD BE REPORTED IMMEDIATELY: *FEVER GREATER THAN 100.4 F (38 C) OR HIGHER *CHILLS OR SWEATING *NAUSEA AND VOMITING THAT IS NOT CONTROLLED WITH YOUR NAUSEA MEDICATION *UNUSUAL SHORTNESS OF BREATH *UNUSUAL BRUISING OR BLEEDING *URINARY PROBLEMS (pain or burning when urinating, or frequent urination) *BOWEL PROBLEMS (unusual diarrhea, constipation, pain near the anus) TENDERNESS IN MOUTH AND THROAT WITH OR WITHOUT PRESENCE OF ULCERS (sore throat, sores in mouth, or a toothache) UNUSUAL RASH, SWELLING OR PAIN  UNUSUAL VAGINAL DISCHARGE OR ITCHING   Items with * indicate a potential emergency and should be followed up as soon as possible or go to the Emergency Department if any problems should occur.  Please show the CHEMOTHERAPY ALERT CARD or IMMUNOTHERAPY ALERT CARD at check-in to the Emergency Department and triage nurse.  Should you have questions  after your visit or need to cancel or reschedule your appointment, please contact [facility] [place] - A DEPT OF [facility] [phone]  and follow the prompts.  Office hours are 8:00 a.m. to 4:30 p.m. Monday - Friday. Please note that voicemails left after 4:00 p.m. may not be returned until the following business day.  We are closed weekends and major holidays. You have access to a nurse at all times for urgent questions. Please call the main number to the clinic [phone] and follow the prompts.  For any non-urgent questions, you may also contact your provider using MyChart. We now offer e-Visits for anyone 69 and older to request care online for non-urgent symptoms. For details visit [email or web address].   Also download the  MyChart app! Go to the app store, search MyChart, open the app, select , and log in with your MyChart username and password.

## 2023-03-17 NOTE — Telephone Encounter (Signed)
 Received call from patient stating that port site began pouring blood after she arrived home from treatment today.  States that is is pooling underneath skin at the site.  Advised her to place pressure over site and have family take her to nearest ER.  No other associated symptoms noted.  Verbalized understanding.

## 2023-03-17 NOTE — Progress Notes (Signed)
 Patient has been examined by Dr. Ellin Saba. Vital signs and labs have been reviewed by MD - ANC, Creatinine, LFTs, hemoglobin, and platelets are within treatment parameters per M.D. - pt may proceed with treatment.  Primary RN and pharmacy notified.

## 2023-03-17 NOTE — Patient Instructions (Signed)

## 2023-03-17 NOTE — Progress Notes (Signed)
Treatment given today per MD orders. Tolerated infusion without adverse affects. Vital signs stable. No complaints at this time. Discharged from clinic by wheel chair in stable condition. Alert and oriented x 3. F/U with [facility] as scheduled.   

## 2023-03-17 NOTE — ED Provider Notes (Signed)
 Emergency Department Provider Note    ED Clinical Impression   Final diagnoses:  Encounter for care related to Port-a-Cath (Primary)    ED Assessment/Plan    Condition: Stable Disposition: Discharge  This chart has been completed using Dragon Medical Dictation software, and while attempts have been made to ensure accuracy, certain words and phrases may not be transcribed as intended.   History   Chief Complaint  Patient presents with  . Portacath Problem   HPI  Rebecca [name] is a 78 y.o. female who presents today to the emergency department complaining of bleeding from the site of her Port-A-Cath after having chemotherapy today.  She reports finishing her chemotherapy at approximately 2:30 and noticed that she had some bleeding at approximately 4:00.  Her shirt was wet over the site, so she came to the ER.  The bleeding had almost stopped by the time she got here to the ER.  She reports having had some lightheaded/dizziness, but states that it has improved.  She takes Plavix  daily and took it this morning.  Her son and daughter in law are in the room with her and states that she normally complains of some lightheaded/dizziness after chemo.  Allergies: is allergic to aspirin, penicillins, and codeine. Medications: is not on any long-term medications. PMHx:  has a past medical history of Cancer (CMS-HCC), Diabetes mellitus (CMS-HCC), and Hypertension. PSHx:  has a past surgical history that includes Portacath placement (Right). SocHx:   Allergies, Medications, Medical, Surgical, and Social History were reviewed as documented above.   Social Drivers of Health with Concerns   Internet Connectivity: Not on file  Housing/Utilities: Not on file  Transportation Needs: Unmet Transportation Needs (02/05/2021)   Received from [facility] - Transportation   . Lack of  Transportation (Medical): Yes   . Lack of Transportation (Non-Medical): No  Alcohol Use: Not on file  Interpersonal Safety: Not on file  Physical Activity: Not on file  Intimate Partner Violence: Not on file  Stress: Stress Concern Present (02/05/2021)   Received from [facility] - Occupational Stress Questionnaire   . Feeling of Stress : To some extent  Substance Use: Not on file (01/07/2023)  Social Connections: Moderately Isolated (02/05/2021)   Received from [facility]   Social Connection and Isolation Panel [NHANES]   . Frequency of Communication with Friends and Family: More than three times a week   . Frequency of Social Gatherings with Friends and Family: More than three times a week   . Attends Religious Services: More than 4 times per year   . Active Member of Clubs or Organizations: No   . Attends Banker Meetings: Never   . Marital Status: Widowed  Depression: Not on file  Health Literacy: Not on file     Review Of Systems  Review of Systems  Constitutional:  Negative for chills and fever.  Respiratory:  Negative for shortness of breath.   Cardiovascular:  Negative for chest pain.  Gastrointestinal:  Negative for nausea and vomiting.  Skin:        Bleeding from Port-A-Cath  All other systems reviewed and are negative.   Physical Exam   BP 147/76   Pulse 105   Temp 36.7 C (98.1  F) (Oral)   Resp 22   Ht 154.9 cm (5' 1)   Wt 96.5 kg (212 lb 12.8 oz)   SpO2 95%   BMI 40.21 kg/m   Physical Exam Vitals and nursing note reviewed.  Constitutional:      General: She is not in acute distress.    Appearance: She is well-developed.  HENT:     Head: Normocephalic and atraumatic.  Cardiovascular:     Rate and Rhythm: Normal rate and regular rhythm.     Heart sounds: Normal heart sounds.  Pulmonary:     Effort: Pulmonary effort is normal.     Breath sounds: Normal breath sounds.  Skin:    General:  Skin is warm and dry.     Capillary Refill: Capillary refill takes 2 to 3 seconds.     Findings: Ecchymosis present.     Comments: Port-A-Cath in right upper chest wall with small amount of bleeding (drops) and a small (1-2 mm) amount of ecchymosis  Neurological:     Mental Status: She is alert and oriented to person, place, and time.  Psychiatric:        Mood and Affect: Mood normal.        Behavior: Behavior normal.     ED Course  Medical Decision Making POCT glucose is 250.  CXR does not show any acute cardiopulmonary process in the right IJ Port-A-Cath remains unchanged.  Pressure dressing applied and she still had some mild oozing, so TXA was ordered for topical treatment; another pressure dressing was applied while awaiting TXA from the pharmacy.  Went to apply TXA, but the bleeding had stopped and was well-controlled.  After observation, there was no further bleeding, so she is stable for discharge home at this time.  Advised direct pressure if any bleeding, Tylenol , to avoid NSAIDs, increased hydration and improved glycemic control.  Follow-up with PCP.  I have reviewed my clinical findings and my clinical impression with the patient. The patient has expressed understanding that at this time there is no evidence for a more serious underlying process, but also understands that early in the process of a condition such as this, an initial workup can be falsely reassuring. I have counseled and discussed follow-up with the patient, stressing the importance of appropriate follow-up. I have also counseled the patient to return if symptoms worsen or if there are any concerns. Routine discharge counseling was given to the patient and the patient understands that worsening, changing or persistent symptoms should prompt an immediate call or follow up with their primary physician or return to the emergency department for reevaluation. Patient has expressed understanding.  Amount and/or Complexity of  Data Reviewed Labs: ordered. Decision-making details documented in ED Course. Radiology: ordered. Decision-making details documented in ED Course.  Risk OTC drugs. Prescription drug management.     Procedures   No results found for this visit on 03/17/23 (from the past 4464 hours).   ED Results Results for orders placed or performed during the hospital encounter of 03/17/23  POCT Glucose  Result Value Ref Range   Glucose, POC 250 (H) 70 - 105 mg/dL   XR Chest 2 views Result Date: 03/17/2023 CLINICAL DATA:  Check Port-A-Cath - bleeding ; OTHER C/o Light headedness, bleeding from port post chemo. EXAM: CHEST - 2 VIEW COMPARISON:  Radiographs 04/16/2021. FINDINGS: Right IJ Port-A-Cath appears unchanged, projecting to the lower SVC level. No catheter discontinuity demonstrated. The heart size and mediastinal contours are stable. The lungs appear clear.  No pleural effusion or pneumothorax. No acute osseous findings are evident. There are mild degenerative changes in the spine.   No evidence of acute cardiopulmonary process. Right IJ Port-A-Cath appears unchanged. Electronically Signed   By: Elsie Perone M.D.   On: 03/17/2023 18:35    Medications Administered: Medications - No data to display  Discharge Medications (Medications Prescribed during this  ED visit and Patient's Home Medications) :    Your Medication List    You have not been prescribed any medications.           Rockey Mallick [place], [place] 03/17/23 2204

## 2023-03-17 NOTE — Progress Notes (Signed)
 Labs reviewed with MD today at office visit. Ok to proceed today with treatment . Sending urine sample down for avastin.

## 2023-03-18 ENCOUNTER — Telehealth: Payer: Self-pay | Admitting: *Deleted

## 2023-03-18 NOTE — Telephone Encounter (Signed)
 Reached out to patient to reassess status of port from yesterday.  She went to the ER and they got bleeding under control.  She describes site as very bruised, however denies any bleeding since discharge from the ER.

## 2023-03-19 ENCOUNTER — Inpatient Hospital Stay: Payer: Medicare Other

## 2023-03-19 VITALS — BP 121/59 | HR 84 | Temp 97.8°F | Resp 18

## 2023-03-19 DIAGNOSIS — Z5112 Encounter for antineoplastic immunotherapy: Secondary | ICD-10-CM | POA: Diagnosis not present

## 2023-03-19 DIAGNOSIS — C562 Malignant neoplasm of left ovary: Secondary | ICD-10-CM

## 2023-03-19 LAB — URINE CULTURE: Culture: >=100000 — AB

## 2023-03-19 MED ORDER — PEGFILGRASTIM-FPGK 6 MG/0.6ML ~~LOC~~ SOSY
6.0000 mg | PREFILLED_SYRINGE | Freq: Once | SUBCUTANEOUS | Status: AC
  Administered 2023-03-19: 6 mg via SUBCUTANEOUS
  Filled 2023-03-19: qty 0.6

## 2023-03-19 NOTE — Progress Notes (Signed)
Stimufend injection given per orders. Patient tolerated it well without problems. Patient stated when she left on the 14th from her treatment day her port site started bleeding and hey could not get it to start, they called the clinic and was told to go to the nearest hospital. She went to [facility] for evaluation.  Patient insisted that Nurse flushed her port today as well. Patients port flushed without difficulty.  Good blood return noted with no bruising or swelling noted at site. Guaze pressure dressing applied and observed patinet for 10 min. No blood present at discharge. Patient will leave dressing on till morning as she requested.   VSS with discharge and left in satisfactory condition with no s/s of distress noted.  Vitals stable and discharged home from clinic ambulatory. Follow up as scheduled.

## 2023-03-19 NOTE — Patient Instructions (Signed)
[facility] [place] - A DEPT OF [facility]  Discharge Instructions: Thank you for choosing [facility] to provide your oncology and hematology care.  If you have a lab appointment with the Cancer Center - please note that after April 8th, 2024, all labs will be drawn in the cancer center.  You do not have to check in or register with the main entrance as you have in the past but will complete your check-in in the cancer center.  Wear comfortable clothing and clothing appropriate for easy access to any Portacath or PICC line.   We strive to give you quality time with your provider. You may need to reschedule your appointment if you arrive late (15 or more minutes).  Arriving late affects you and other patients whose appointments are after yours.  Also, if you miss three or more appointments without notifying the office, you may be dismissed from the clinic at the provider's discretion.      For prescription refill requests, have your pharmacy contact our office and allow 72 hours for refills to be completed.    Today you received the following injeciton:Stimufend   To help prevent nausea and vomiting after your treatment, we encourage you to take your nausea medication as directed.  BELOW ARE SYMPTOMS THAT SHOULD BE REPORTED IMMEDIATELY: *FEVER GREATER THAN 100.4 F (38 C) OR HIGHER *CHILLS OR SWEATING *NAUSEA AND VOMITING THAT IS NOT CONTROLLED WITH YOUR NAUSEA MEDICATION *UNUSUAL SHORTNESS OF BREATH *UNUSUAL BRUISING OR BLEEDING *URINARY PROBLEMS (pain or burning when urinating, or frequent urination) *BOWEL PROBLEMS (unusual diarrhea, constipation, pain near the anus) TENDERNESS IN MOUTH AND THROAT WITH OR WITHOUT PRESENCE OF ULCERS (sore throat, sores in mouth, or a toothache) UNUSUAL RASH, SWELLING OR PAIN  UNUSUAL VAGINAL DISCHARGE OR ITCHING   Items with * indicate a potential emergency and should be followed up as soon as possible or go to the  Emergency Department if any problems should occur.  Please show the CHEMOTHERAPY ALERT CARD or IMMUNOTHERAPY ALERT CARD at check-in to the Emergency Department and triage nurse.  Should you have questions after your visit or need to cancel or reschedule your appointment, please contact [facility]  - A DEPT OF [facility] [phone]  and follow the prompts.  Office hours are 8:00 a.m. to 4:30 p.m. Monday - Friday. Please note that voicemails left after 4:00 p.m. may not be returned until the following business day.  We are closed weekends and major holidays. You have access to a nurse at all times for urgent questions. Please call the main number to the clinic [phone] and follow the prompts.  For any non-urgent questions, you may also contact your provider using MyChart. We now offer e-Visits for anyone 91 and older to request care online for non-urgent symptoms. For details visit [email or web address].   Also download the MyChart app! Go to the app store, search "MyChart", open the app, select [place], and log in with your MyChart username and password.

## 2023-03-25 ENCOUNTER — Ambulatory Visit: Payer: Medicare Other

## 2023-03-25 ENCOUNTER — Other Ambulatory Visit: Payer: Medicare Other

## 2023-04-07 ENCOUNTER — Inpatient Hospital Stay: Payer: Medicare Other

## 2023-04-07 ENCOUNTER — Inpatient Hospital Stay: Payer: Medicare Other | Admitting: Hematology

## 2023-04-08 ENCOUNTER — Inpatient Hospital Stay: Payer: Medicare Other

## 2023-04-08 ENCOUNTER — Inpatient Hospital Stay: Payer: Medicare Other | Admitting: Hematology

## 2023-04-09 ENCOUNTER — Ambulatory Visit: Payer: Medicare Other

## 2023-04-10 ENCOUNTER — Inpatient Hospital Stay: Payer: Medicare Other

## 2023-04-14 ENCOUNTER — Inpatient Hospital Stay: Payer: Medicare Other | Admitting: Hematology

## 2023-04-14 ENCOUNTER — Inpatient Hospital Stay: Payer: Medicare Other

## 2023-04-15 ENCOUNTER — Other Ambulatory Visit: Payer: Self-pay

## 2023-04-16 ENCOUNTER — Inpatient Hospital Stay: Payer: Medicare Other

## 2023-04-20 NOTE — Progress Notes (Signed)
[facility] [address], [place] [address]    Clinic Day:  04/21/2023  Referring physician: Adolphus Birchwood, MD  Patient Care Team: Adolphus Birchwood, MD as PCP - General (Gynecologic Oncology) Doreatha Massed, MD as Medical Oncologist (Medical Oncology) Therese Sarah, RN as Oncology Nurse Navigator (Oncology) [name], Consuello Bossier, NP as Nurse Practitioner (Cardiothoracic Surgery)   ASSESSMENT & PLAN:   Assessment: 1. Stage IIIb high-grade serous ovarian carcinoma: - Presentation with abdominal distention and bloating sensation. - CT abdomen at outside hospital on 12/05/2020 with findings concerning for ovarian neoplasm. - CT chest without contrast on 12/18/2020 with scattered solid pulmonary nodules measuring up to 3 mm, indeterminate. - CA125 on 12/05/2020-10000 - 12/18/2020 exploratory laparotomy, bilateral salpingo-oophorectomy, infra gastric omentectomy, R0 primary tumor debulking by Dr. Andrey Farmer at [facility]. - Pathology-left ovary high-grade serous carcinoma, greatest dimension 26.5 cm, left ovarian capsule intact.  Fallopian tube surface involvement present, omentum involvement present, largest extrapelvic peritoneal focus-macroscopic, peritoneal/ascitic fluid involvement negative.  [id number].  FIGO stage IIIb. - She was evaluated for FLORA 5 clinical trial, found not to be a candidate. - Germline mutation testing was negative. - 6 cycles of carboplatin and paclitaxel from 02/11/2021 through 05/28/2021.  She has refused niraparib maintenance. - CTAP on 11/10/2022 showed recurrence in the left pelvic sidewall. - She met with Dr. Andrey Farmer and felt not a candidate for debulking.  She is considered platinum sensitive. - Carboplatin, Doxil started on 01/12/2023    2. Social/family history: - She is seen with her daughter-in-law today.  She lives by herself.  She ambulates with the help of cane.  She retired after working at [employer].  She is non-smoker. -  Brother had prostate cancer.  Another brother had throat cancer.  Sister had kidney cancer.  Grandson had bladder cancer at age 38.  Paternal uncle had head and neck cancer.    Plan: 1. Stage IIIb high-grade serous ovarian carcinoma: - She has tolerated cycle 3 reasonably well.  Denies any GI side effects. - Occasional blood-tinged mucus when she blows her nose.  No active bleeding. - Labs today: Normal LFTs.  Creatinine mildly elevated at 1.16 and stable.  CBC was grossly normal with hemoglobin 9.3 from myelosuppression.  CA125 was normal last time. - She has some burning sensation after urination.  Will check UA today. - She may proceed with cycle 4 today.  Will increase bevacizumab to 15 mg/kg dose.  She will come back in 4 weeks for follow-up.  Will plan on repeating CTAP with contrast prior to next visit.  2.  Hypomagnesemia: - Continue magnesium 1 tablet daily.  Magnesium is normal today at 1.7.   3.  Peripheral neuropathy: - Numbness in the feet and fingertips is stable.  4.  High risk drug monitoring: - 2D echo on 01/07/2023: EF 65 to 70%.    Orders Placed This Encounter  Procedures   Urine culture   CT ABDOMEN PELVIS W CONTRAST    Standing Status:   Future    Expected Date:   05/19/2023    Expiration Date:   04/20/2024    If indicated for the ordered procedure, I authorize the administration of contrast media per Radiology protocol:   Yes    Does the patient have a contrast media/X-ray dye allergy?:   No    Preferred imaging location?:   [facility]    If indicated for the ordered procedure, I authorize the administration of oral contrast media  per Radiology protocol:   Yes   CA 125    Standing Status:   Future    Expected Date:   04/21/2023    Expiration Date:   04/20/2024   CA 125    Standing Status:   Future    Expected Date:   05/11/2023    Expiration Date:   05/10/2024   CA 125    Standing Status:   Future    Expected Date:   06/08/2023    Expiration Date:    06/07/2024   CA 125    Standing Status:   Future    Expected Date:   07/06/2023    Expiration Date:   07/05/2024   Magnesium    Standing Status:   Future    Expected Date:   07/06/2023    Expiration Date:   07/05/2024   CBC with Differential    Standing Status:   Future    Expected Date:   07/06/2023    Expiration Date:   07/05/2024   CA 125    Standing Status:   Future    Expected Date:   07/27/2023    Expiration Date:   07/26/2024   Magnesium    Standing Status:   Future    Expected Date:   07/27/2023    Expiration Date:   07/26/2024   CBC with Differential    Standing Status:   Future    Expected Date:   07/27/2023    Expiration Date:   07/26/2024   CA 125    Standing Status:   Future    Expected Date:   08/17/2023    Expiration Date:   08/16/2024   Magnesium    Standing Status:   Future    Expected Date:   08/17/2023    Expiration Date:   08/16/2024   CBC with Differential    Standing Status:   Future    Expected Date:   08/17/2023    Expiration Date:   08/16/2024   Urinalysis, dipstick only    Standing Status:   Future    Expected Date:   08/17/2023    Expiration Date:   08/16/2024   CA 125    Standing Status:   Future    Expected Date:   09/07/2023    Expiration Date:   09/06/2024   Magnesium    Standing Status:   Future    Expected Date:   09/07/2023    Expiration Date:   09/06/2024   CBC with Differential    Standing Status:   Future    Expected Date:   09/07/2023    Expiration Date:   09/06/2024   CA 125   Urinalysis, Routine w reflex microscopic      I,Helena R Teague,acting as a Neurosurgeon for Doreatha Massed, MD.,have documented all relevant documentation on the behalf of Doreatha Massed, MD,as directed by  Doreatha Massed, MD while in the presence of Doreatha Massed, MD.  I, Doreatha Massed MD, have reviewed the above documentation for accuracy and completeness, and I agree with the above.     Doreatha Massed, MD   2/18/20259:45 AM  CHIEF COMPLAINT:    Diagnosis: ovarian cancer    Cancer Staging  Ovarian cancer on left [facility]) Staging form: Ovary, Fallopian Tube, and Primary Peritoneal Carcinoma, AJCC 8th Edition - Clinical stage from 01/30/2021: FIGO Stage IIIB (cT3b, cNX, cM0) - Signed by Doreatha Massed, MD on 01/30/2021    Prior Therapy: 1. Exploratory laparotomy, BSO, and infra-gastric  omentectomy on 12/19/2020 with Dr. Andrey Farmer  2. Carboplatin (AUC 6) / Paclitaxel (175) q21d x 6 cycles 02/11/2021 - 05/28/2021   Current Therapy:  carboplatin and Doxil with bevacizumab    HISTORY OF PRESENT ILLNESS:   Oncology History  Ovarian cancer on left [facility])  01/07/2021 Initial Diagnosis   Ovarian cancer on left [facility])   01/30/2021 Cancer Staging   Staging form: Ovary, Fallopian Tube, and Primary Peritoneal Carcinoma, AJCC 8th Edition - Clinical stage from 01/30/2021: FIGO Stage IIIB (cT3b, cNX, cM0) - Signed by Doreatha Massed, MD on 01/30/2021 Histologic grade (G): G3 Histologic grading system: 4 grade system   02/11/2021 - 05/28/2021 Chemotherapy   Patient is on Treatment Plan : OVARIAN Carboplatin (AUC 6) / Paclitaxel (175) q21d x 6 cycles     01/12/2023 -  Chemotherapy   Patient is on Treatment Plan : OVARIAN Liposomal Doxorubicin D1 + Carboplatin D1 + Bevacizumab D1,15 q28d / Bevacizumab q21d        INTERVAL HISTORY:   Rebecca [name] is a 79 y.o. female presenting to clinic today for follow up of ovarian cancer. She was last seen by me on 03/17/23.  Since her last visit, she presented to the ED at [facility] on 03/17/23 for bleeding at her port site that resolved on its own.   Today, she states that she is doing well overall. Her appetite level is at 100%. Her energy level is at 100%. She denies any recent infections. She tolerated her last treatment well. She notes temporary numbness in the feet and hands after treatment are stable. Helana reports intermittent epistaxis occurring about once daily on the left side of the nose. Blood  appears as clots and is not runny.  She notes dysuria and believes her UTI has not completely resolved. Zoeya states Cipro improved symptoms while she took it,and would like a refill of the medication. She is taking Magnesium OD.   PAST MEDICAL HISTORY:   Past Medical History: Past Medical History:  Diagnosis Date   Anemia    CAD (coronary artery disease)    DM2 (diabetes mellitus, type 2) (HCC)    GERD (gastroesophageal reflux disease)    HTN (hypertension)    Ovarian ca (HCC)    Port-A-Cath in place 02/02/2021    Surgical History: Past Surgical History:  Procedure Laterality Date   IR IMAGING GUIDED PORT INSERTION  02/08/2021    Social History: Social History   Socioeconomic History   Marital status: Single    Spouse name: Not on file   Number of children: Not on file   Years of education: Not on file   Highest education level: Not on file  Occupational History   Not on file  Tobacco Use   Smoking status: Not on file   Smokeless tobacco: Not on file  Substance and Sexual Activity   Alcohol use: Not on file   Drug use: Not on file   Sexual activity: Not on file  Other Topics Concern   Not on file  Social History Narrative   Not on file   Social Drivers of Health   Financial Resource Strain: Low Risk  (02/05/2021)   Overall Financial Resource Strain (CARDIA)    Difficulty of Paying Living Expenses: Not hard at all  Food Insecurity: No Food Insecurity (02/05/2021)   Hunger Vital Sign    Worried About Running Out of Food in the Last Year: Never true    Ran Out of Food in the Last Year: Never true  Transportation Needs: Unmet Transportation Needs (02/05/2021)   PRAPARE - Administrator, Civil Service (Medical): Yes    Lack of Transportation (Non-Medical): No  Physical Activity: Not on file  Stress: Stress Concern Present (02/05/2021)   [employer] of Occupational Health - Occupational Stress Questionnaire    Feeling of Stress : To some extent   Social Connections: Moderately Isolated (02/05/2021)   Social Connection and Isolation Panel [NHANES]    Frequency of Communication with Friends and Family: More than three times a week    Frequency of Social Gatherings with Friends and Family: More than three times a week    Attends Religious Services: More than 4 times per year    Active Member of [employer] or Organizations: No    Attends Banker Meetings: Never    Marital Status: Widowed  Intimate Partner Violence: Not on file    Family History: No family history on file.  Current Medications:  Current Outpatient Medications:    acetaminophen (TYLENOL) 325 MG tablet, Take 3 tablets (975 mg total) by mouth every 6 (six) hours as needed for moderate pain., Disp: 360 tablet, Rfl: 2   atorvastatin (LIPITOR) 40 MG tablet, Take 40 mg by mouth at bedtime., Disp: , Rfl:    azelastine (ASTELIN) 0.1 % nasal spray, Place 1 spray into both nostrils 2 (two) times daily as needed for allergies., Disp: , Rfl:    B-D ULTRAFINE III SHORT PEN 31G X 8 MM MISC, SMARTSIG:1 Each SUB-Q Daily, Disp: , Rfl:    baclofen (LIORESAL) 10 MG tablet, Take 10 mg by mouth 3 (three) times daily as needed for muscle spasms., Disp: , Rfl:    clopidogrel (PLAVIX) 75 MG tablet, Take 75 mg by mouth daily., Disp: , Rfl:    diphenhydrAMINE-zinc acetate (BENADRYL) cream, Apply 1 application topically 3 (three) times daily as needed for itching., Disp: , Rfl:    docusate sodium (COLACE) 100 MG capsule, Take 1 capsule (100 mg total) by mouth daily as needed for mild constipation., Disp: 30 capsule, Rfl: 1   ferrous sulfate 325 (65 FE) MG tablet, Take 325 mg by mouth every evening., Disp: , Rfl:    furosemide (LASIX) 20 MG tablet, Take 1 tablet (20 mg total) by mouth in the morning., Disp: 30 tablet, Rfl: 2   gabapentin (NEURONTIN) 300 MG capsule, Take 1 capsule (300 mg total) by mouth 3 (three) times daily., Disp: 60 capsule, Rfl: 2   HYDROcodone-acetaminophen  (NORCO/VICODIN) 5-325 MG tablet, Take 1 tablet by mouth every 6 (six) hours as needed for moderate pain., Disp: 60 tablet, Rfl: 0   lidocaine-prilocaine (EMLA) cream, Apply to affected area once, Disp: 30 g, Rfl: 3   losartan (COZAAR) 50 MG tablet, Take 50 mg by mouth at bedtime., Disp: , Rfl:    magnesium oxide (MAG-OX) 400 (240 Mg) MG tablet, Take 1 tablet (400 mg total) by mouth 2 (two) times daily., Disp: 60 tablet, Rfl: 6   Misc. Devices MISC, Please provide patient with 3 diabetic nutritional supplements per day., Disp: 1 each, Rfl: 11   nitrofurantoin, macrocrystal-monohydrate, (MACROBID) 100 MG capsule, Take 100 mg by mouth 2 (two) times daily., Disp: , Rfl:    pantoprazole (PROTONIX) 20 MG tablet, Take 20 mg by mouth daily., Disp: , Rfl:    predniSONE (DELTASONE) 50 MG tablet, Take 13hrs, 7hrs and 1hr before port placementTake 13hrs, 7hrs and 1hr before port placement, Disp: 3 tablet, Rfl: 0   prochlorperazine (COMPAZINE) 10 MG tablet,  Take 1 tablet (10 mg total) by mouth every 6 (six) hours as needed for nausea or vomiting., Disp: 60 tablet, Rfl: 3   SOLIQUA 100-33 UNT-MCG/ML SOPN, Inject 45 Units into the skin daily., Disp: , Rfl:    Tetrahydrozoline HCl (REDNESS RELIEVER EYE DROPS OP), Place 1 drop into both eyes daily as needed (redness)., Disp: , Rfl:  No current facility-administered medications for this visit.  Facility-Administered Medications Ordered in Other Visits:    0.9 %  sodium chloride infusion, , Intravenous, Continuous, Doreatha Massed, MD, Last Rate: 10 mL/hr at 04/21/23 0941, New Bag at 04/21/23 0941   bevacizumab-adcd (VEGZELMA) 1,500 mg in sodium chloride 0.9 % 100 mL chemo infusion, 15 mg/kg (Order-Specific), Intravenous, Once, Doreatha Massed, MD   CARBOplatin (PARAPLATIN) 400 mg in sodium chloride 0.9 % 250 mL chemo infusion, 400 mg, Intravenous, Once, Doreatha Massed, MD   cetirizine (QUZYTTIR) injection 10 mg, 10 mg, Intravenous, Once, Doreatha Massed, MD   dexamethasone (DECADRON) injection 10 mg, 10 mg, Intravenous, Once, Doreatha Massed, MD   dextrose 5 % solution, , Intravenous, Continuous, Doreatha Massed, MD   DOXOrubicin HCL LIPOSOMAL (DOXIL) 50 mg in dextrose 5 % 250 mL chemo infusion, 22.5 mg/m2 (Order-Specific), Intravenous, Once, Doreatha Massed, MD   famotidine (PEPCID) IVPB 20 mg premix, 20 mg, Intravenous, Once, Doreatha Massed, MD   fosaprepitant (EMEND) 150 mg in sodium chloride 0.9 % 145 mL IVPB, 150 mg, Intravenous, Once, Doreatha Massed, MD   heparin lock flush 100 unit/mL, 500 Units, Intracatheter, Once PRN, Doreatha Massed, MD   palonosetron (ALOXI) injection 0.25 mg, 0.25 mg, Intravenous, Once, Doreatha Massed, MD   sodium chloride flush (NS) 0.9 % injection 10 mL, 10 mL, Intracatheter, PRN, Doreatha Massed, MD   Allergies: Allergies  Allergen Reactions   Aspirin Hives   Mometasone Itching   Naproxen Sodium Itching   Penicillin G Swelling   Triamcinolone Itching   Codeine Palpitations   Ibuprofen Rash   Iodinated Contrast Media Itching and Rash    REVIEW OF SYSTEMS:   Review of Systems  Constitutional:  Negative for chills, fatigue and fever.  HENT:   Negative for lump/mass, mouth sores, nosebleeds, sore throat and trouble swallowing.   Eyes:  Positive for eye problems.  Respiratory:  Positive for cough and shortness of breath (with exertion).   Cardiovascular:  Positive for chest pain. Negative for leg swelling and palpitations.  Gastrointestinal:  Positive for constipation. Negative for abdominal pain, diarrhea, nausea and vomiting.  Genitourinary:  Positive for dysuria. Negative for bladder incontinence, difficulty urinating, frequency, hematuria and nocturia.   Musculoskeletal:  Negative for arthralgias, back pain, flank pain, myalgias and neck pain.  Skin:  Negative for itching and rash.  Neurological:  Negative for dizziness, headaches and numbness.   Hematological:  Does not bruise/bleed easily.  Psychiatric/Behavioral:  Positive for sleep disturbance. Negative for depression and suicidal ideas. The patient is not nervous/anxious.   All other systems reviewed and are negative.    VITALS:   Weight 207 lb 14.3 oz (94.3 kg).  Wt Readings from Last 3 Encounters:  04/21/23 207 lb 14.3 oz (94.3 kg)  03/17/23 213 lb 13.5 oz (97 kg)  02/11/23 212 lb (96.2 kg)    Body mass index is 36.83 kg/m.  Performance status (ECOG): 1 - Symptomatic but completely ambulatory  PHYSICAL EXAM:   Physical Exam Vitals and nursing note reviewed. Exam conducted with a chaperone present.  Constitutional:      Appearance: Normal appearance.  Cardiovascular:  Rate and Rhythm: Normal rate and regular rhythm.     Pulses: Normal pulses.     Heart sounds: Normal heart sounds.  Pulmonary:     Effort: Pulmonary effort is normal.     Breath sounds: Normal breath sounds.  Abdominal:     Palpations: Abdomen is soft. There is no hepatomegaly, splenomegaly or mass.     Tenderness: There is no abdominal tenderness.  Musculoskeletal:     Right lower leg: No edema.     Left lower leg: No edema.  Lymphadenopathy:     Cervical: No cervical adenopathy.     Right cervical: No superficial, deep or posterior cervical adenopathy.    Left cervical: No superficial, deep or posterior cervical adenopathy.     Upper Body:     Right upper body: No supraclavicular or axillary adenopathy.     Left upper body: No supraclavicular or axillary adenopathy.  Neurological:     General: No focal deficit present.     Mental Status: She is alert and oriented to person, place, and time.  Psychiatric:        Mood and Affect: Mood normal.        Behavior: Behavior normal.     LABS:      Latest Ref Rng & Units 04/21/2023    8:45 AM 03/17/2023    8:33 AM 02/11/2023    8:22 AM  CBC  WBC 4.0 - 10.5 K/uL 5.1  7.4  7.5   Hemoglobin 12.0 - 15.0 g/dL 9.3  9.6  9.8   Hematocrit  36.0 - 46.0 % 27.2  28.3  28.9   Platelets 150 - 400 K/uL 254  331  348       Latest Ref Rng & Units 04/21/2023    8:45 AM 03/17/2023    8:33 AM 02/11/2023    8:22 AM  CMP  Glucose 70 - 99 mg/dL 147  829  562   BUN 8 - 23 mg/dL 20  22  16    Creatinine 0.44 - 1.00 mg/dL 1.30  8.65  7.84   Sodium 135 - 145 mmol/L 138  137  135   Potassium 3.5 - 5.1 mmol/L 3.7  3.7  4.0   Chloride 98 - 111 mmol/L 105  104  102   CO2 22 - 32 mmol/L 21  23  23    Calcium 8.9 - 10.3 mg/dL 9.3  9.0  9.3   Total Protein 6.5 - 8.1 g/dL 7.2  6.6  7.0   Total Bilirubin 0.0 - 1.2 mg/dL 0.7  0.6  0.4   Alkaline Phos 38 - 126 U/L 79  103  83   AST 15 - 41 U/L 16  16  17    ALT 0 - 44 U/L 13  17  18       No results found for: "CEA1", "CEA" / No results found for: "CEA1", "CEA" No results found for: "PSA1" No results found for: "CAN199" Lab Results  Component Value Date   CAN125 13.7 11/06/2022    No results found for: "TOTALPROTELP", "ALBUMINELP", "A1GS", "A2GS", "BETS", "BETA2SER", "GAMS", "MSPIKE", "SPEI" Lab Results  Component Value Date   TIBC 343 11/06/2022   TIBC 365 06/24/2022   FERRITIN 301 11/06/2022   FERRITIN 186 06/24/2022   IRONPCTSAT 19 11/06/2022   IRONPCTSAT 19 06/24/2022   Lab Results  Component Value Date   LDH 119 02/11/2021     STUDIES:   No results found.

## 2023-04-21 ENCOUNTER — Inpatient Hospital Stay: Payer: Medicare Other | Attending: Hematology

## 2023-04-21 ENCOUNTER — Inpatient Hospital Stay: Payer: Medicare Other

## 2023-04-21 ENCOUNTER — Inpatient Hospital Stay (HOSPITAL_BASED_OUTPATIENT_CLINIC_OR_DEPARTMENT_OTHER): Payer: Medicare Other | Admitting: Hematology

## 2023-04-21 VITALS — BP 159/67 | HR 109 | Temp 97.7°F | Resp 18

## 2023-04-21 VITALS — Wt 207.9 lb

## 2023-04-21 DIAGNOSIS — E1142 Type 2 diabetes mellitus with diabetic polyneuropathy: Secondary | ICD-10-CM | POA: Diagnosis not present

## 2023-04-21 DIAGNOSIS — Z5111 Encounter for antineoplastic chemotherapy: Secondary | ICD-10-CM | POA: Insufficient documentation

## 2023-04-21 DIAGNOSIS — C562 Malignant neoplasm of left ovary: Secondary | ICD-10-CM | POA: Diagnosis not present

## 2023-04-21 DIAGNOSIS — R300 Dysuria: Secondary | ICD-10-CM

## 2023-04-21 DIAGNOSIS — Z79899 Other long term (current) drug therapy: Secondary | ICD-10-CM | POA: Diagnosis not present

## 2023-04-21 DIAGNOSIS — Z5112 Encounter for antineoplastic immunotherapy: Secondary | ICD-10-CM | POA: Insufficient documentation

## 2023-04-21 LAB — CBC WITH DIFFERENTIAL/PLATELET
Abs Immature Granulocytes: 0.01 10*3/uL (ref 0.00–0.07)
Basophils Absolute: 0 10*3/uL (ref 0.0–0.1)
Basophils Relative: 1 %
Eosinophils Absolute: 0.2 10*3/uL (ref 0.0–0.5)
Eosinophils Relative: 3 %
HCT: 27.2 % — ABNORMAL LOW (ref 36.0–46.0)
Hemoglobin: 9.3 g/dL — ABNORMAL LOW (ref 12.0–15.0)
Immature Granulocytes: 0 %
Lymphocytes Relative: 28 %
Lymphs Abs: 1.4 10*3/uL (ref 0.7–4.0)
MCH: 34.3 pg — ABNORMAL HIGH (ref 26.0–34.0)
MCHC: 34.2 g/dL (ref 30.0–36.0)
MCV: 100.4 fL — ABNORMAL HIGH (ref 80.0–100.0)
Monocytes Absolute: 0.6 10*3/uL (ref 0.1–1.0)
Monocytes Relative: 12 %
Neutro Abs: 2.9 10*3/uL (ref 1.7–7.7)
Neutrophils Relative %: 56 %
Platelets: 254 10*3/uL (ref 150–400)
RBC: 2.71 MIL/uL — ABNORMAL LOW (ref 3.87–5.11)
RDW: 15 % (ref 11.5–15.5)
WBC: 5.1 10*3/uL (ref 4.0–10.5)
nRBC: 0 % (ref 0.0–0.2)

## 2023-04-21 LAB — COMPREHENSIVE METABOLIC PANEL
ALT: 13 U/L (ref 0–44)
AST: 16 U/L (ref 15–41)
Albumin: 3.8 g/dL (ref 3.5–5.0)
Alkaline Phosphatase: 79 U/L (ref 38–126)
Anion gap: 12 (ref 5–15)
BUN: 20 mg/dL (ref 8–23)
CO2: 21 mmol/L — ABNORMAL LOW (ref 22–32)
Calcium: 9.3 mg/dL (ref 8.9–10.3)
Chloride: 105 mmol/L (ref 98–111)
Creatinine, Ser: 1.16 mg/dL — ABNORMAL HIGH (ref 0.44–1.00)
GFR, Estimated: 49 mL/min — ABNORMAL LOW (ref >60)
Glucose, Bld: 164 mg/dL — ABNORMAL HIGH (ref 70–99)
Potassium: 3.7 mmol/L (ref 3.5–5.1)
Sodium: 138 mmol/L (ref 135–145)
Total Bilirubin: 0.7 mg/dL (ref 0.0–1.2)
Total Protein: 7.2 g/dL (ref 6.5–8.1)

## 2023-04-21 LAB — URINALYSIS, ROUTINE W REFLEX MICROSCOPIC
Bilirubin Urine: NEGATIVE
Glucose, UA: NEGATIVE mg/dL
Hgb urine dipstick: NEGATIVE
Ketones, ur: NEGATIVE mg/dL
Nitrite: NEGATIVE
Protein, ur: NEGATIVE mg/dL
Specific Gravity, Urine: 1.005 (ref 1.005–1.030)
pH: 6 (ref 5.0–8.0)

## 2023-04-21 LAB — MAGNESIUM: Magnesium: 1.7 mg/dL (ref 1.7–2.4)

## 2023-04-21 MED ORDER — SODIUM CHLORIDE 0.9 % IV SOLN
400.0000 mg | Freq: Once | INTRAVENOUS | Status: AC
  Administered 2023-04-21: 400 mg via INTRAVENOUS
  Filled 2023-04-21: qty 40

## 2023-04-21 MED ORDER — CETIRIZINE HCL 10 MG/ML IV SOLN
10.0000 mg | Freq: Once | INTRAVENOUS | Status: AC
  Administered 2023-04-21: 10 mg via INTRAVENOUS
  Filled 2023-04-21: qty 1

## 2023-04-21 MED ORDER — SODIUM CHLORIDE 0.9 % IV SOLN
INTRAVENOUS | Status: DC
Start: 2023-04-21 — End: 2023-04-21

## 2023-04-21 MED ORDER — PALONOSETRON HCL INJECTION 0.25 MG/5ML
0.2500 mg | Freq: Once | INTRAVENOUS | Status: AC
  Administered 2023-04-21: 0.25 mg via INTRAVENOUS
  Filled 2023-04-21: qty 5

## 2023-04-21 MED ORDER — SODIUM CHLORIDE 0.9% FLUSH
10.0000 mL | Freq: Once | INTRAVENOUS | Status: AC
  Administered 2023-04-21: 10 mL via INTRAVENOUS

## 2023-04-21 MED ORDER — SODIUM CHLORIDE 0.9 % IV SOLN
15.0000 mg/kg | Freq: Once | INTRAVENOUS | Status: AC
  Administered 2023-04-21: 1500 mg via INTRAVENOUS
  Filled 2023-04-21: qty 48

## 2023-04-21 MED ORDER — FAMOTIDINE IN NACL 20-0.9 MG/50ML-% IV SOLN
20.0000 mg | Freq: Once | INTRAVENOUS | Status: AC
  Administered 2023-04-21: 20 mg via INTRAVENOUS
  Filled 2023-04-21: qty 50

## 2023-04-21 MED ORDER — HEPARIN SOD (PORK) LOCK FLUSH 100 UNIT/ML IV SOLN
500.0000 [IU] | Freq: Once | INTRAVENOUS | Status: AC | PRN
  Administered 2023-04-21: 500 [IU]

## 2023-04-21 MED ORDER — SODIUM CHLORIDE 0.9 % IV SOLN
150.0000 mg | Freq: Once | INTRAVENOUS | Status: AC
  Administered 2023-04-21: 150 mg via INTRAVENOUS
  Filled 2023-04-21: qty 5

## 2023-04-21 MED ORDER — SODIUM CHLORIDE 0.9% FLUSH
10.0000 mL | INTRAVENOUS | Status: DC | PRN
  Administered 2023-04-21: 10 mL

## 2023-04-21 MED ORDER — DEXAMETHASONE SODIUM PHOSPHATE 10 MG/ML IJ SOLN
10.0000 mg | Freq: Once | INTRAMUSCULAR | Status: AC
  Administered 2023-04-21: 10 mg via INTRAVENOUS
  Filled 2023-04-21: qty 1

## 2023-04-21 MED ORDER — DOXORUBICIN HCL LIPOSOMAL CHEMO INJECTION 2 MG/ML
22.5000 mg/m2 | Freq: Once | INTRAVENOUS | Status: AC
  Administered 2023-04-21: 50 mg via INTRAVENOUS
  Filled 2023-04-21: qty 25

## 2023-04-21 MED ORDER — DEXTROSE 5 % IV SOLN: INTRAVENOUS | Status: DC

## 2023-04-21 NOTE — Progress Notes (Signed)
Patient has been examined by Dr. Ellin Saba. Vital signs and labs have been reviewed by MD - ANC, Creatinine, LFTs, hemoglobin, and platelets are within treatment parameters per M.D. - pt may proceed with treatment. Avastin increased to 15 mg/kg. Primary RN and pharmacy notified.

## 2023-04-21 NOTE — Patient Instructions (Signed)

## 2023-04-21 NOTE — Patient Instructions (Signed)
[facility] [place] - A DEPT OF [facility]  Discharge Instructions: Thank you for choosing [facility] to provide your oncology and hematology care.  If you have a lab appointment with the Cancer Center - please note that after April 8th, 2024, all labs will be drawn in the cancer center.  You do not have to check in or register with the main entrance as you have in the past but will complete your check-in in the cancer center.  Wear comfortable clothing and clothing appropriate for easy access to any Portacath or PICC line.   We strive to give you quality time with your provider. You may need to reschedule your appointment if you arrive late (15 or more minutes).  Arriving late affects you and other patients whose appointments are after yours.  Also, if you miss three or more appointments without notifying the office, you may be dismissed from the clinic at the provider's discretion.      For prescription refill requests, have your pharmacy contact our office and allow 72 hours for refills to be completed.    Today you received the following chemotherapy and/or immunotherapy agents Vegzelma, Doxil, Carboplatin      To help prevent nausea and vomiting after your treatment, we encourage you to take your nausea medication as directed.  BELOW ARE SYMPTOMS THAT SHOULD BE REPORTED IMMEDIATELY: *FEVER GREATER THAN 100.4 F (38 C) OR HIGHER *CHILLS OR SWEATING *NAUSEA AND VOMITING THAT IS NOT CONTROLLED WITH YOUR NAUSEA MEDICATION *UNUSUAL SHORTNESS OF BREATH *UNUSUAL BRUISING OR BLEEDING *URINARY PROBLEMS (pain or burning when urinating, or frequent urination) *BOWEL PROBLEMS (unusual diarrhea, constipation, pain near the anus) TENDERNESS IN MOUTH AND THROAT WITH OR WITHOUT PRESENCE OF ULCERS (sore throat, sores in mouth, or a toothache) UNUSUAL RASH, SWELLING OR PAIN  UNUSUAL VAGINAL DISCHARGE OR ITCHING   Items with * indicate a potential emergency and  should be followed up as soon as possible or go to the Emergency Department if any problems should occur.  Please show the CHEMOTHERAPY ALERT CARD or IMMUNOTHERAPY ALERT CARD at check-in to the Emergency Department and triage nurse.  Should you have questions after your visit or need to cancel or reschedule your appointment, please contact [facility] [place] - A DEPT OF [facility] [phone]  and follow the prompts.  Office hours are 8:00 a.m. to 4:30 p.m. Monday - Friday. Please note that voicemails left after 4:00 p.m. may not be returned until the following business day.  We are closed weekends and major holidays. You have access to a nurse at all times for urgent questions. Please call the main number to the clinic [phone] and follow the prompts.  For any non-urgent questions, you may also contact your provider using MyChart. We now offer e-Visits for anyone 8 and older to request care online for non-urgent symptoms. For details visit [email or web address].   Also download the MyChart app! Go to the app store, search "MyChart", open the app, select [facility] [place], and log in with your MyChart username and password.

## 2023-04-21 NOTE — Progress Notes (Signed)
Patient presents today for Vegzelma/Doxil/Carboplatin per providers order.  Vital signs and labs reviewed by MD. Message received from Chapman Moss RN/Dr. Ellin Saba patient okay for treatment.  Treatment given today per MD orders.  Stable during infusion without adverse affects.  Vital signs stable.  No complaints at this time.  Discharge from clinic ambulatory in stable condition.  Alert and oriented X 3.  Follow up with [facility] as scheduled.

## 2023-04-22 ENCOUNTER — Other Ambulatory Visit: Payer: Self-pay | Admitting: *Deleted

## 2023-04-22 LAB — CA 125: Cancer Antigen (CA) 125: 12.8 U/mL (ref 0.0–38.1)

## 2023-04-22 MED ORDER — PREDNISONE 50 MG PO TABS: ORAL_TABLET | ORAL | 0 refills | Status: DC

## 2023-04-22 MED ORDER — CIPROFLOXACIN HCL 500 MG PO TABS: 500.0000 mg | ORAL_TABLET | Freq: Two times a day (BID) | ORAL | 0 refills | Status: AC

## 2023-04-22 NOTE — Progress Notes (Signed)
Dr Ellin Saba aware and will continue cipro 500 mg bid x 5 days as therapy

## 2023-04-23 ENCOUNTER — Inpatient Hospital Stay: Payer: Medicare Other

## 2023-04-23 VITALS — BP 120/50 | HR 71 | Temp 97.9°F | Resp 20

## 2023-04-23 DIAGNOSIS — Z5112 Encounter for antineoplastic immunotherapy: Secondary | ICD-10-CM | POA: Diagnosis not present

## 2023-04-23 DIAGNOSIS — C562 Malignant neoplasm of left ovary: Secondary | ICD-10-CM

## 2023-04-23 LAB — URINE CULTURE: Culture: >=100000 — AB

## 2023-04-23 MED ORDER — PEGFILGRASTIM-FPGK 6 MG/0.6ML ~~LOC~~ SOSY
6.0000 mg | PREFILLED_SYRINGE | Freq: Once | SUBCUTANEOUS | Status: AC
  Administered 2023-04-23: 6 mg via SUBCUTANEOUS
  Filled 2023-04-23: qty 0.6

## 2023-04-23 NOTE — Patient Instructions (Signed)
 [facility] [place] - A DEPT OF [facility]  Discharge Instructions: Thank you for choosing [facility] to provide your oncology and hematology care.  If you have a lab appointment with the Cancer Center - please note that after April 8th, 2024, all labs will be drawn in the cancer center.  You do not have to check in or register with the main entrance as you have in the past but will complete your check-in in the cancer center.  Wear comfortable clothing and clothing appropriate for easy access to any Portacath or PICC line.   We strive to give you quality time with your provider. You may need to reschedule your appointment if you arrive late (15 or more minutes).  Arriving late affects you and other patients whose appointments are after yours.  Also, if you miss three or more appointments without notifying the office, you may be dismissed from the clinic at the provider's discretion.      For prescription refill requests, have your pharmacy contact our office and allow 72 hours for refills to be completed.    Today you received the following chemotherapy and/or immunotherapy agents Stimufend.  Pegfilgrastim Injection What is this medication? PEGFILGRASTIM (PEG fil gra stim) lowers the risk of infection in people who are receiving chemotherapy. It works by Systems analyst make more white blood cells, which protects your body from infection. It may also be used to help people who have been exposed to high doses of radiation. This medicine may be used for other purposes; ask your health care provider or pharmacist if you have questions. COMMON BRAND NAME(S): Cherly Hensen, Neulasta, Nyvepria, Stimufend, UDENYCA, UDENYCA ONBODY, Ziextenzo What should I tell my care team before I take this medication? They need to know if you have any of these conditions: Kidney disease Latex allergy Ongoing radiation therapy Sickle cell disease Skin reactions to  acrylic adhesives (On-Body Injector only) An unusual or allergic reaction to pegfilgrastim, filgrastim, other medications, foods, dyes, or preservatives Pregnant or trying to get pregnant Breast-feeding How should I use this medication? This medication is for injection under the skin. If you get this medication at home, you will be taught how to prepare and give the pre-filled syringe or how to use the On-body Injector. Refer to the patient Instructions for Use for detailed instructions. Use exactly as directed. Tell your care team immediately if you suspect that the On-body Injector may not have performed as intended or if you suspect the use of the On-body Injector resulted in a missed or partial dose. It is important that you put your used needles and syringes in a special sharps container. Do not put them in a trash can. If you do not have a sharps container, call your pharmacist or care team to get one. Talk to your care team about the use of this medication in children. While this medication may be prescribed for selected conditions, precautions do apply. Overdosage: If you think you have taken too much of this medicine contact a poison control center or emergency room at once. NOTE: This medicine is only for you. Do not share this medicine with others. What if I miss a dose? It is important not to miss your dose. Call your care team if you miss your dose. If you miss a dose due to an On-body Injector failure or leakage, a new dose should be administered as soon as possible using a single prefilled syringe for  manual use. What may interact with this medication? Interactions have not been studied. This list may not describe all possible interactions. Give your health care provider a list of all the medicines, herbs, non-prescription drugs, or dietary supplements you use. Also tell them if you smoke, drink alcohol, or use illegal drugs. Some items may interact with your medicine. What should I  watch for while using this medication? Your condition will be monitored carefully while you are receiving this medication. You may need blood work done while you are taking this medication. Talk to your care team about your risk of cancer. You may be more at risk for certain types of cancer if you take this medication. If you are going to need a MRI, CT scan, or other procedure, tell your care team that you are using this medication (On-Body Injector only). What side effects may I notice from receiving this medication? Side effects that you should report to your care team as soon as possible: Allergic reactions--skin rash, itching, hives, swelling of the face, lips, tongue, or throat Capillary leak syndrome--stomach or muscle pain, unusual weakness or fatigue, feeling faint or lightheaded, decrease in the amount of urine, swelling of the ankles, hands, or feet, trouble breathing High white blood cell level--fever, fatigue, trouble breathing, night sweats, change in vision, weight loss Inflammation of the aorta--fever, fatigue, back, chest, or stomach pain, severe headache Kidney injury (glomerulonephritis)--decrease in the amount of urine, red or dark Joannah Gitlin urine, foamy or bubbly urine, swelling of the ankles, hands, or feet Shortness of breath or trouble breathing Spleen injury--pain in upper left stomach or shoulder Unusual bruising or bleeding Side effects that usually do not require medical attention (report to your care team if they continue or are bothersome): Bone pain Pain in the hands or feet This list may not describe all possible side effects. Call your doctor for medical advice about side effects. You may report side effects to FDA at 1-800-FDA-1088. Where should I keep my medication? Keep out of the reach of children. If you are using this medication at home, you will be instructed on how to store it. Throw away any unused medication after the expiration date on the label. NOTE:  This sheet is a summary. It may not cover all possible information. If you have questions about this medicine, talk to your doctor, pharmacist, or health care provider.  2024 Elsevier/Gold Standard (2021-01-18 00:00:00)        To help prevent nausea and vomiting after your treatment, we encourage you to take your nausea medication as directed.  BELOW ARE SYMPTOMS THAT SHOULD BE REPORTED IMMEDIATELY: *FEVER GREATER THAN 100.4 F (38 C) OR HIGHER *CHILLS OR SWEATING *NAUSEA AND VOMITING THAT IS NOT CONTROLLED WITH YOUR NAUSEA MEDICATION *UNUSUAL SHORTNESS OF BREATH *UNUSUAL BRUISING OR BLEEDING *URINARY PROBLEMS (pain or burning when urinating, or frequent urination) *BOWEL PROBLEMS (unusual diarrhea, constipation, pain near the anus) TENDERNESS IN MOUTH AND THROAT WITH OR WITHOUT PRESENCE OF ULCERS (sore throat, sores in mouth, or a toothache) UNUSUAL RASH, SWELLING OR PAIN  UNUSUAL VAGINAL DISCHARGE OR ITCHING   Items with * indicate a potential emergency and should be followed up as soon as possible or go to the Emergency Department if any problems should occur.  Please show the CHEMOTHERAPY ALERT CARD or IMMUNOTHERAPY ALERT CARD at check-in to the Emergency Department and triage nurse.  Should you have questions after your visit or need to cancel or reschedule your appointment, please contact [facility] [place] -  A DEPT OF Eligha Bridegroom [facility] [phone]  and follow the prompts.  Office hours are 8:00 a.m. to 4:30 p.m. Monday - Friday. Please note that voicemails left after 4:00 p.m. may not be returned until the following business day.  We are closed weekends and major holidays. You have access to a nurse at all times for urgent questions. Please call the main number to the clinic [phone] and follow the prompts.  For any non-urgent questions, you may also contact your provider using MyChart. We now offer e-Visits for anyone 41 and older to request care online for  non-urgent symptoms. For details visit [email or web address].   Also download the MyChart app! Go to the app store, search "MyChart", open the app, select [facility], and log in with your MyChart username and password.

## 2023-04-23 NOTE — Progress Notes (Signed)
Rebecca [name] presents today for injection per the provider's orders.  STIMUFEND administration without incident; injection site WNL; see MAR for injection details.  Patient tolerated procedure well and without incident.  No questions or complaints noted at this time. Discharged from clinic ambulatory in stable condition. Alert and oriented x 3. F/U with [facility] as scheduled.

## 2023-04-25 ENCOUNTER — Other Ambulatory Visit: Payer: Self-pay

## 2023-05-05 ENCOUNTER — Inpatient Hospital Stay: Payer: Medicare Other | Admitting: Hematology

## 2023-05-05 ENCOUNTER — Inpatient Hospital Stay: Payer: Medicare Other

## 2023-05-05 ENCOUNTER — Other Ambulatory Visit: Payer: Medicare Other

## 2023-05-06 ENCOUNTER — Inpatient Hospital Stay: Payer: Medicare Other

## 2023-05-06 ENCOUNTER — Inpatient Hospital Stay: Payer: Medicare Other | Admitting: Hematology

## 2023-05-07 ENCOUNTER — Inpatient Hospital Stay: Payer: Medicare Other

## 2023-05-12 ENCOUNTER — Ambulatory Visit (HOSPITAL_COMMUNITY)
Admission: RE | Admit: 2023-05-12 | Discharge: 2023-05-12 | Disposition: A | Discharge status: Home / Self Care | Payer: Medicare Other | Source: Ambulatory Visit | Attending: Hematology | Admitting: Hematology

## 2023-05-12 ENCOUNTER — Inpatient Hospital Stay: Payer: Medicare Other

## 2023-05-12 ENCOUNTER — Inpatient Hospital Stay: Payer: Medicare Other | Attending: Hematology

## 2023-05-12 ENCOUNTER — Inpatient Hospital Stay: Payer: Medicare Other | Admitting: Hematology

## 2023-05-12 DIAGNOSIS — E1142 Type 2 diabetes mellitus with diabetic polyneuropathy: Secondary | ICD-10-CM | POA: Diagnosis not present

## 2023-05-12 DIAGNOSIS — Z5112 Encounter for antineoplastic immunotherapy: Secondary | ICD-10-CM | POA: Insufficient documentation

## 2023-05-12 DIAGNOSIS — Z5111 Encounter for antineoplastic chemotherapy: Secondary | ICD-10-CM | POA: Diagnosis present

## 2023-05-12 DIAGNOSIS — Z79899 Other long term (current) drug therapy: Secondary | ICD-10-CM | POA: Diagnosis not present

## 2023-05-12 DIAGNOSIS — C562 Malignant neoplasm of left ovary: Secondary | ICD-10-CM | POA: Insufficient documentation

## 2023-05-12 LAB — COMPREHENSIVE METABOLIC PANEL
ALT: 18 U/L (ref 0–44)
AST: 21 U/L (ref 15–41)
Albumin: 3.9 g/dL (ref 3.5–5.0)
Alkaline Phosphatase: 93 U/L (ref 38–126)
Anion gap: 11 (ref 5–15)
BUN: 21 mg/dL (ref 8–23)
CO2: 21 mmol/L — ABNORMAL LOW (ref 22–32)
Calcium: 9.7 mg/dL (ref 8.9–10.3)
Chloride: 100 mmol/L (ref 98–111)
Creatinine, Ser: 1.08 mg/dL — ABNORMAL HIGH (ref 0.44–1.00)
GFR, Estimated: 53 mL/min — ABNORMAL LOW (ref >60)
Glucose, Bld: 206 mg/dL — ABNORMAL HIGH (ref 70–99)
Potassium: 4.4 mmol/L (ref 3.5–5.1)
Sodium: 132 mmol/L — ABNORMAL LOW (ref 135–145)
Total Bilirubin: 0.6 mg/dL (ref 0.0–1.2)
Total Protein: 7.4 g/dL (ref 6.5–8.1)

## 2023-05-12 LAB — CBC WITH DIFFERENTIAL/PLATELET
Abs Immature Granulocytes: 0.11 10*3/uL — ABNORMAL HIGH (ref 0.00–0.07)
Basophils Absolute: 0 10*3/uL (ref 0.0–0.1)
Basophils Relative: 0 %
Eosinophils Absolute: 0 10*3/uL (ref 0.0–0.5)
Eosinophils Relative: 0 %
HCT: 28.5 % — ABNORMAL LOW (ref 36.0–46.0)
Hemoglobin: 9.7 g/dL — ABNORMAL LOW (ref 12.0–15.0)
Immature Granulocytes: 1 %
Lymphocytes Relative: 10 %
Lymphs Abs: 0.8 10*3/uL (ref 0.7–4.0)
MCH: 33.6 pg (ref 26.0–34.0)
MCHC: 34 g/dL (ref 30.0–36.0)
MCV: 98.6 fL (ref 80.0–100.0)
Monocytes Absolute: 0.2 10*3/uL (ref 0.1–1.0)
Monocytes Relative: 3 %
Neutro Abs: 6.5 10*3/uL (ref 1.7–7.7)
Neutrophils Relative %: 86 %
Platelets: 142 10*3/uL — ABNORMAL LOW (ref 150–400)
RBC: 2.89 MIL/uL — ABNORMAL LOW (ref 3.87–5.11)
RDW: 15.6 % — ABNORMAL HIGH (ref 11.5–15.5)
WBC: 7.7 10*3/uL (ref 4.0–10.5)
nRBC: 0 % (ref 0.0–0.2)

## 2023-05-12 LAB — MAGNESIUM: Magnesium: 1.5 mg/dL — ABNORMAL LOW (ref 1.7–2.4)

## 2023-05-12 MED ORDER — SODIUM CHLORIDE 0.9% FLUSH
10.0000 mL | INTRAVENOUS | Status: DC | PRN
  Administered 2023-05-12: 10 mL via INTRAVENOUS

## 2023-05-12 MED ORDER — IOHEXOL 300 MG/ML  SOLN
100.0000 mL | Freq: Once | INTRAMUSCULAR | Status: AC | PRN
  Administered 2023-05-12: 100 mL via INTRAVENOUS

## 2023-05-12 MED ORDER — HEPARIN SOD (PORK) LOCK FLUSH 100 UNIT/ML IV SOLN
500.0000 [IU] | Freq: Once | INTRAVENOUS | Status: AC
  Administered 2023-05-12: 500 [IU] via INTRAVENOUS

## 2023-05-12 NOTE — Progress Notes (Signed)
 Patient returned to the clinic after CT scan was done to have port taken out. Gauze and paper tape placed on port to hold pressure. Port flushed easily with good blood return and needle removed intact. No bruising or swelling noted at the site.  Discharged from clinic ambulatory in stable condition. Alert and oriented x 3. F/U with [facility] as scheduled.

## 2023-05-12 NOTE — Progress Notes (Signed)
 Patient presents today for port flush with labs per provider's order. Patient flushed easily with good blood return noted with no swelling or bruising noted  at the site. Patient remains accessed for Ct scan.

## 2023-05-12 NOTE — Addendum Note (Signed)
 Addended by: Virgina Organ on: 05/12/2023 04:05 PM   Modules accepted: Orders

## 2023-05-13 LAB — CA 125: Cancer Antigen (CA) 125: 15 U/mL (ref 0.0–38.1)

## 2023-05-14 ENCOUNTER — Inpatient Hospital Stay: Payer: Medicare Other

## 2023-05-18 NOTE — Progress Notes (Signed)
 [facility] [address], [place] [address]    Clinic Day:  05/19/2023  Referring physician: Adolphus Birchwood, MD  Patient Care Team: Adolphus Birchwood, MD as PCP - General (Gynecologic Oncology) Doreatha Massed, MD as Medical Oncologist (Medical Oncology) Therese Sarah, RN as Oncology Nurse Navigator (Oncology) [name], Consuello Bossier, NP as Nurse Practitioner (Cardiothoracic Surgery)   ASSESSMENT & PLAN:   Assessment: 1. Stage IIIb high-grade serous ovarian carcinoma: - Presentation with abdominal distention and bloating sensation. - CT abdomen at outside hospital on 12/05/2020 with findings concerning for ovarian neoplasm. - CT chest without contrast on 12/18/2020 with scattered solid pulmonary nodules measuring up to 3 mm, indeterminate. - CA125 on 12/05/2020-10000 - 12/18/2020 exploratory laparotomy, bilateral salpingo-oophorectomy, infra gastric omentectomy, R0 primary tumor debulking by Dr. Andrey Farmer at [facility]. - Pathology-left ovary high-grade serous carcinoma, greatest dimension 26.5 cm, left ovarian capsule intact.  Fallopian tube surface involvement present, omentum involvement present, largest extrapelvic peritoneal focus-macroscopic, peritoneal/ascitic fluid involvement negative.  PT3BPNX.  FIGO stage IIIb. - She was evaluated for FLORA 5 clinical trial, found not to be a candidate. - Germline mutation testing was negative. - 6 cycles of carboplatin and paclitaxel from 02/11/2021 through 05/28/2021.  She has refused niraparib maintenance. - CTAP on 11/10/2022 showed recurrence in the left pelvic sidewall. - She met with Dr. Andrey Farmer and felt not a candidate for debulking.  She is considered platinum sensitive. - Carboplatin, Doxil started on 01/12/2023    2. Social/family history: - She is seen with her daughter-in-law today.  She lives by herself.  She ambulates with the help of cane.  She retired after working at [employer].  She is non-smoker. -  Brother had prostate cancer.  Another brother had throat cancer.  Sister had kidney cancer.  Grandson had bladder cancer at age 16.  Paternal uncle had head and neck cancer.    Plan: 1. Stage IIIb high-grade serous ovarian carcinoma: - She has completed 4 cycles of carboplatin Doxil and bevacizumab. - She is tolerating it reasonably well. - Labs from 05/12/2023: LFTs are normal.  Creatinine 1.08 and stable.  CBC grossly normal with hemoglobin 9.7 from myelosuppression.  CA125 is 15 and stable. - CTAP (05/12/2023): Diminished size of soft tissue nodule at the left vaginal cuff measuring 1.8 x 1.5 cm, previously 3.3 x 2.4 cm.  Unchanged small nodule just anteriorly measuring 0.5 cm.  Unchanged 0.6 cm peritoneal nodule in the left upper quadrant adjacent to the splenic flexure.  No new evidence of lymphadenopathy or metastatic disease.  Other benign findings were discussed. - She was treated for a E. coli UTI with Cipro for 5 days.  She reports occasional dysuria.  I have recommended that she increase her fluid intake to 80 to 100 ounces daily. - She may proceed with cycle 5 today.  RTC 4 weeks for follow-up.  After cycle 6, we will maintain her on bevacizumab.  2.  Hypomagnesemia: - She is taking magnesium 1 tablet daily.  Magnesium is low at 1.5.  She was told to increase magnesium to twice daily.   3.  Peripheral neuropathy: - Numbness in the feet and fingertips is stable.  4.  High risk drug monitoring: - Last 2D echo on 01/07/2023: EF 65 to 70%.  No signs of PND or orthopnea.    No orders of the defined types were placed in this encounter.     Alben Deeds Teague,acting as a Neurosurgeon for Doreatha Massed, MD.,have documented  all relevant documentation on the behalf of Doreatha Massed, MD,as directed by  Doreatha Massed, MD while in the presence of Doreatha Massed, MD.  I, Doreatha Massed MD, have reviewed the above documentation for accuracy and completeness, and I agree  with the above.     Doreatha Massed, MD   3/18/202511:39 AM  CHIEF COMPLAINT:   Diagnosis: ovarian cancer    Cancer Staging  Ovarian cancer on left [facility]) Staging form: Ovary, Fallopian Tube, and Primary Peritoneal Carcinoma, AJCC 8th Edition - Clinical stage from 01/30/2021: FIGO Stage IIIB (cT3b, cNX, cM0) - Signed by Doreatha Massed, MD on 01/30/2021    Prior Therapy: 1. Exploratory laparotomy, BSO, and infra-gastric omentectomy on 12/19/2020 with Dr. Andrey Farmer  2. Carboplatin (AUC 6) / Paclitaxel (175) q21d x 6 cycles 02/11/2021 - 05/28/2021   Current Therapy:  carboplatin and Doxil with bevacizumab    HISTORY OF PRESENT ILLNESS:   Oncology History  Ovarian cancer on left [facility])  01/07/2021 Initial Diagnosis   Ovarian cancer on left [facility])   01/30/2021 Cancer Staging   Staging form: Ovary, Fallopian Tube, and Primary Peritoneal Carcinoma, AJCC 8th Edition - Clinical stage from 01/30/2021: FIGO Stage IIIB (cT3b, cNX, cM0) - Signed by Doreatha Massed, MD on 01/30/2021 Histologic grade (G): G3 Histologic grading system: 4 grade system   02/11/2021 - 05/28/2021 Chemotherapy   Patient is on Treatment Plan : OVARIAN Carboplatin (AUC 6) / Paclitaxel (175) q21d x 6 cycles     01/12/2023 -  Chemotherapy   Patient is on Treatment Plan : OVARIAN Liposomal Doxorubicin D1 + Carboplatin D1 + Bevacizumab D1,15 q28d / Bevacizumab q21d        INTERVAL HISTORY:   Rebecca [name] is a 78 y.o. female presenting to clinic today for follow up of ovarian cancer. She was last seen by me on 04/21/23.  Since her last visit, she underwent CT A/P on 05/12/23 that found: Diminished size of a soft tissue nodule at the left aspect of the vaginal cuff, consistent with treatment response. Unchanged small nodule just anteriorly. Unchanged small peritoneal nodule in the left upper quadrant adjacent to the splenic flexure. No new evidence of lymphadenopathy or metastatic disease in the abdomen or pelvis.  Diffuse thickening of the urinary bladder wall, consistent with nonspecific infectious or inflammatory cystitis. Diastasis rectus and small broad-based low midline ventral hernia containing fat and nonobstructed mid small bowel. Coronary artery disease.   Today, she states that she is doing well overall. Her appetite level is at 100%. Her energy level is at 75%. Rebecca [name] is accompanied by her husband. She has been taking Magnesium once a day. Rebecca [name] notes occasional dysuria. She denies any hematuria or BRBPR. Her dysuria has improved since finishing her Cipro x 5 days 2 weeks ago, though it has not resolved.   Rebecca [name] drinks 40 oz of water a day, juices, and Coca-Cola.   PAST MEDICAL HISTORY:   Past Medical History: Past Medical History:  Diagnosis Date   Anemia    CAD (coronary artery disease)    DM2 (diabetes mellitus, type 2) (HCC)    GERD (gastroesophageal reflux disease)    HTN (hypertension)    Ovarian ca (HCC)    Port-A-Cath in place 02/02/2021    Surgical History: Past Surgical History:  Procedure Laterality Date   IR IMAGING GUIDED PORT INSERTION  02/08/2021    Social History: Social History   Socioeconomic History   Marital status: Single    Spouse name: Not on file   Number of  children: Not on file   Years of education: Not on file   Highest education level: Not on file  Occupational History   Not on file  Tobacco Use   Smoking status: Not on file   Smokeless tobacco: Not on file  Substance and Sexual Activity   Alcohol use: Not on file   Drug use: Not on file   Sexual activity: Not on file  Other Topics Concern   Not on file  Social History Narrative   Not on file   Social Drivers of Health   Financial Resource Strain: Low Risk  (02/05/2021)   Overall Financial Resource Strain (CARDIA)    Difficulty of Paying Living Expenses: Not hard at all  Food Insecurity: No Food Insecurity (02/05/2021)   Hunger Vital Sign    Worried About Running Out of Food in the Last  Year: Never true    Ran Out of Food in the Last Year: Never true  Transportation Needs: Unmet Transportation Needs (02/05/2021)   PRAPARE - Administrator, Civil Service (Medical): Yes    Lack of Transportation (Non-Medical): No  Physical Activity: Not on file  Stress: Stress Concern Present (02/05/2021)   [employer] of Occupational Health - Occupational Stress Questionnaire    Feeling of Stress : To some extent  Social Connections: Moderately Isolated (02/05/2021)   Social Connection and Isolation Panel [NHANES]    Frequency of Communication with Friends and Family: More than three times a week    Frequency of Social Gatherings with Friends and Family: More than three times a week    Attends Religious Services: More than 4 times per year    Active Member of [employer] or Organizations: No    Attends Banker Meetings: Never    Marital Status: Widowed  Intimate Partner Violence: Not on file    Family History: No family history on file.  Current Medications:  Current Outpatient Medications:    acetaminophen (TYLENOL) 325 MG tablet, Take 3 tablets (975 mg total) by mouth every 6 (six) hours as needed for moderate pain., Disp: 360 tablet, Rfl: 2   atorvastatin (LIPITOR) 40 MG tablet, Take 40 mg by mouth at bedtime., Disp: , Rfl:    azelastine (ASTELIN) 0.1 % nasal spray, Place 1 spray into both nostrils 2 (two) times daily as needed for allergies., Disp: , Rfl:    B-D ULTRAFINE III SHORT PEN 31G X 8 MM MISC, SMARTSIG:1 Each SUB-Q Daily, Disp: , Rfl:    baclofen (LIORESAL) 10 MG tablet, Take 10 mg by mouth 3 (three) times daily as needed for muscle spasms., Disp: , Rfl:    clopidogrel (PLAVIX) 75 MG tablet, Take 75 mg by mouth daily., Disp: , Rfl:    diphenhydrAMINE-zinc acetate (BENADRYL) cream, Apply 1 application topically 3 (three) times daily as needed for itching., Disp: , Rfl:    docusate sodium (COLACE) 100 MG capsule, Take 1 capsule (100 mg total) by mouth  daily as needed for mild constipation., Disp: 30 capsule, Rfl: 1   ferrous sulfate 325 (65 FE) MG tablet, Take 325 mg by mouth every evening., Disp: , Rfl:    furosemide (LASIX) 20 MG tablet, Take 1 tablet (20 mg total) by mouth in the morning., Disp: 30 tablet, Rfl: 2   gabapentin (NEURONTIN) 300 MG capsule, Take 1 capsule (300 mg total) by mouth 3 (three) times daily., Disp: 60 capsule, Rfl: 2   HYDROcodone-acetaminophen (NORCO/VICODIN) 5-325 MG tablet, Take 1 tablet by mouth every 6 (  six) hours as needed for moderate pain., Disp: 60 tablet, Rfl: 0   lidocaine-prilocaine (EMLA) cream, Apply to affected area once, Disp: 30 g, Rfl: 3   losartan (COZAAR) 50 MG tablet, Take 50 mg by mouth at bedtime., Disp: , Rfl:    magnesium oxide (MAG-OX) 400 (240 Mg) MG tablet, Take 1 tablet (400 mg total) by mouth 2 (two) times daily., Disp: 60 tablet, Rfl: 6   Misc. Devices MISC, Please provide patient with 3 diabetic nutritional supplements per day., Disp: 1 each, Rfl: 11   nitrofurantoin, macrocrystal-monohydrate, (MACROBID) 100 MG capsule, Take 100 mg by mouth 2 (two) times daily., Disp: , Rfl:    pantoprazole (PROTONIX) 20 MG tablet, Take 20 mg by mouth daily., Disp: , Rfl:    predniSONE (DELTASONE) 50 MG tablet, Take 13hrs, 7hrs and 1hr before port placementTake 13hrs, 7hrs and 1hr before port placement, Disp: 3 tablet, Rfl: 0   prochlorperazine (COMPAZINE) 10 MG tablet, Take 1 tablet (10 mg total) by mouth every 6 (six) hours as needed for nausea or vomiting., Disp: 60 tablet, Rfl: 3   SOLIQUA 100-33 UNT-MCG/ML SOPN, Inject 45 Units into the skin daily., Disp: , Rfl:    Tetrahydrozoline HCl (REDNESS RELIEVER EYE DROPS OP), Place 1 drop into both eyes daily as needed (redness)., Disp: , Rfl:  No current facility-administered medications for this visit.  Facility-Administered Medications Ordered in Other Visits:    0.9 %  sodium chloride infusion, , Intravenous, Continuous, Doreatha Massed, MD, Last  Rate: 10 mL/hr at 05/19/23 1121, New Bag at 05/19/23 1121   bevacizumab-adcd (VEGZELMA) 1,500 mg in sodium chloride 0.9 % 100 mL chemo infusion, 15 mg/kg (Order-Specific), Intravenous, Once, Doreatha Massed, MD   CARBOplatin (PARAPLATIN) 400 mg in sodium chloride 0.9 % 250 mL chemo infusion, 400 mg, Intravenous, Once, Doreatha Massed, MD   dextrose 5 % solution, , Intravenous, Continuous, Doreatha Massed, MD   DOXOrubicin HCL LIPOSOMAL (DOXIL) 50 mg in dextrose 5 % 250 mL chemo infusion, 22.5 mg/m2 (Order-Specific), Intravenous, Once, Doreatha Massed, MD   famotidine (PEPCID) IVPB 20 mg premix, 20 mg, Intravenous, Once, Doreatha Massed, MD   fosaprepitant (EMEND) 150 mg in sodium chloride 0.9 % 145 mL IVPB, 150 mg, Intravenous, Once, Doreatha Massed, MD   heparin lock flush 100 unit/mL, 500 Units, Intracatheter, Once PRN, Doreatha Massed, MD   insulin aspart (novoLOG) injection 10 Units, 10 Units, Subcutaneous, Once, Doreatha Massed, MD   magnesium sulfate IVPB 2 g 50 mL, 2 g, Intravenous, Once, Doreatha Massed, MD   Allergies: Allergies  Allergen Reactions   Aspirin Hives   Mometasone Itching   Naproxen Sodium Itching   Penicillin G Swelling   Triamcinolone Itching   Codeine Palpitations   Ibuprofen Rash   Iodinated Contrast Media Itching and Rash    REVIEW OF SYSTEMS:   Review of Systems  Constitutional:  Negative for chills, fatigue and fever.  HENT:   Negative for lump/mass, mouth sores, nosebleeds, sore throat and trouble swallowing.   Eyes:  Negative for eye problems.  Respiratory:  Positive for cough and shortness of breath.   Cardiovascular:  Negative for chest pain, leg swelling and palpitations.  Gastrointestinal:  Positive for abdominal pain (right side) and constipation. Negative for diarrhea, nausea and vomiting.  Genitourinary:  Positive for dysuria. Negative for bladder incontinence, difficulty urinating, frequency,  hematuria and nocturia.   Musculoskeletal:  Negative for arthralgias, back pain, flank pain, myalgias and neck pain.  Skin:  Negative for itching and rash.  Neurological:  Positive for headaches and numbness (in fingers and feet). Negative for dizziness.  Hematological:  Does not bruise/bleed easily.  Psychiatric/Behavioral:  Positive for sleep disturbance. Negative for depression and suicidal ideas. The patient is not nervous/anxious.   All other systems reviewed and are negative.    VITALS:   Blood pressure 138/66, pulse 82, temperature 97.6 F (36.4 C), temperature source Tympanic, resp. rate 20, weight 208 lb 15.9 oz (94.8 kg), SpO2 99%.  Wt Readings from Last 3 Encounters:  05/19/23 208 lb 15.9 oz (94.8 kg)  04/21/23 207 lb 14.3 oz (94.3 kg)  03/17/23 213 lb 13.5 oz (97 kg)    Body mass index is 37.02 kg/m.  Performance status (ECOG): 1 - Symptomatic but completely ambulatory  PHYSICAL EXAM:   Physical Exam Vitals and nursing note reviewed. Exam conducted with a chaperone present.  Constitutional:      Appearance: Normal appearance.  Cardiovascular:     Rate and Rhythm: Normal rate and regular rhythm.     Pulses: Normal pulses.     Heart sounds: Normal heart sounds.  Pulmonary:     Effort: Pulmonary effort is normal.     Breath sounds: Normal breath sounds.  Abdominal:     Palpations: Abdomen is soft. There is no hepatomegaly, splenomegaly or mass.     Tenderness: There is no abdominal tenderness.  Musculoskeletal:     Right lower leg: No edema.     Left lower leg: No edema.  Lymphadenopathy:     Cervical: No cervical adenopathy.     Right cervical: No superficial, deep or posterior cervical adenopathy.    Left cervical: No superficial, deep or posterior cervical adenopathy.     Upper Body:     Right upper body: No supraclavicular or axillary adenopathy.     Left upper body: No supraclavicular or axillary adenopathy.  Neurological:     General: No focal deficit  present.     Mental Status: She is alert and oriented to person, place, and time.  Psychiatric:        Mood and Affect: Mood normal.        Behavior: Behavior normal.     LABS:      Latest Ref Rng & Units 05/12/2023    1:59 PM 04/21/2023    8:45 AM 03/17/2023    8:33 AM  CBC  WBC 4.0 - 10.5 K/uL 7.7  5.1  7.4   Hemoglobin 12.0 - 15.0 g/dL 9.7  9.3  9.6   Hematocrit 36.0 - 46.0 % 28.5  27.2  28.3   Platelets 150 - 400 K/uL 142  254  331       Latest Ref Rng & Units 05/12/2023    1:59 PM 04/21/2023    8:45 AM 03/17/2023    8:33 AM  CMP  Glucose 70 - 99 mg/dL 161  096  045   BUN 8 - 23 mg/dL 21  20  22    Creatinine 0.44 - 1.00 mg/dL 4.09  8.11  9.14   Sodium 135 - 145 mmol/L 132  138  137   Potassium 3.5 - 5.1 mmol/L 4.4  3.7  3.7   Chloride 98 - 111 mmol/L 100  105  104   CO2 22 - 32 mmol/L 21  21  23    Calcium 8.9 - 10.3 mg/dL 9.7  9.3  9.0   Total Protein 6.5 - 8.1 g/dL 7.4  7.2  6.6   Total Bilirubin 0.0 - 1.2 mg/dL  0.6  0.7  0.6   Alkaline Phos 38 - 126 U/L 93  79  103   AST 15 - 41 U/L 21  16  16    ALT 0 - 44 U/L 18  13  17       No results found for: "CEA1", "CEA" / No results found for: "CEA1", "CEA" No results found for: "PSA1" No results found for: "CAN199" Lab Results  Component Value Date   CAN125 15.0 05/12/2023    No results found for: "TOTALPROTELP", "ALBUMINELP", "A1GS", "A2GS", "BETS", "BETA2SER", "GAMS", "MSPIKE", "SPEI" Lab Results  Component Value Date   TIBC 343 11/06/2022   TIBC 365 06/24/2022   FERRITIN 301 11/06/2022   FERRITIN 186 06/24/2022   IRONPCTSAT 19 11/06/2022   IRONPCTSAT 19 06/24/2022   Lab Results  Component Value Date   LDH 119 02/11/2021     STUDIES:   CT ABDOMEN PELVIS W CONTRAST Result Date: 05/19/2023 CLINICAL DATA:  Ovarian cancer restaging, status post chemotherapy, oophorectomy, and omentectomy * Tracking Code: BO * EXAM: CT ABDOMEN AND PELVIS WITH CONTRAST TECHNIQUE: Multidetector CT imaging of the abdomen and  pelvis was performed using the standard protocol following bolus administration of intravenous contrast. RADIATION DOSE REDUCTION: This exam was performed according to the departmental dose-optimization program which includes automated exposure control, adjustment of the mA and/or kV according to patient size and/or use of iterative reconstruction technique. CONTRAST:  OMNIPAQUE IOHEXOL 300 MG/ML SOLN, additional oral enteric contrast COMPARISON:  11/10/2022 FINDINGS: Lower chest: No acute abnormality.  Coronary artery calcifications. Hepatobiliary: No solid liver abnormality is seen. Unchanged subcentimeter hypodensity in the right lobe of liver, almost certainly a tiny cyst or hemangioma (series 2, image 23). Status post cholecystectomy. No biliary dilatation. Pancreas: Unremarkable. No pancreatic ductal dilatation or surrounding inflammatory changes. Spleen: Normal in size without significant abnormality. Adrenals/Urinary Tract: Adrenal glands are unremarkable. Kidneys are normal, without renal calculi, solid lesion, or hydronephrosis. Diffuse thickening of the urinary bladder wall (series 5, image 79). Stomach/Bowel: Stomach is within normal limits. Appendix appears normal. No evidence of bowel wall thickening, distention, or inflammatory changes. Status post omentectomy. Vascular/Lymphatic: Aortic atherosclerosis. No enlarged abdominal or pelvic lymph nodes. Reproductive: Status post hysterectomy and oophorectomy. Diminished size of a soft tissue nodule at the left aspect of the vaginal cuff, measuring 1.8 x 1.5 cm, previously 3.3 x 2.4 cm (series 2, image 77). Unchanged small nodule just anteriorly measuring 0.5 cm (series 5, image 75). Other: Diastasis rectus and small broad-based low midline ventral hernia containing fat and nonobstructed mid small bowel (series 2, image 75). Unchanged 0.6 cm peritoneal nodule in the left upper quadrant adjacent to the splenic flexure (series 2, image 25). Ascites.  Musculoskeletal: No acute or significant osseous findings. IMPRESSION: 1. Status post hysterectomy, oophorectomy, and omentectomy. 2. Diminished size of a soft tissue nodule at the left aspect of the vaginal cuff, consistent with treatment response. Unchanged small nodule just anteriorly. 3. Unchanged small peritoneal nodule in the left upper quadrant adjacent to the splenic flexure. 4. No new evidence of lymphadenopathy or metastatic disease in the abdomen or pelvis. 5. Diffuse thickening of the urinary bladder wall, consistent with nonspecific infectious or inflammatory cystitis. Correlate with urinalysis. 6. Diastasis rectus and small broad-based low midline ventral hernia containing fat and nonobstructed mid small bowel. 7. Coronary artery disease. Aortic Atherosclerosis (ICD10-I70.0). Electronically Signed   By: Jearld Lesch M.D.   On: 05/19/2023 08:44

## 2023-05-19 ENCOUNTER — Inpatient Hospital Stay: Payer: Medicare Other

## 2023-05-19 ENCOUNTER — Inpatient Hospital Stay (HOSPITAL_BASED_OUTPATIENT_CLINIC_OR_DEPARTMENT_OTHER): Payer: Medicare Other | Admitting: Hematology

## 2023-05-19 VITALS — BP 143/83

## 2023-05-19 VITALS — BP 138/66 | HR 82 | Temp 97.6°F | Resp 20 | Wt 209.0 lb

## 2023-05-19 DIAGNOSIS — C562 Malignant neoplasm of left ovary: Secondary | ICD-10-CM

## 2023-05-19 DIAGNOSIS — Z5112 Encounter for antineoplastic immunotherapy: Secondary | ICD-10-CM | POA: Diagnosis not present

## 2023-05-19 MED ORDER — HEPARIN SOD (PORK) LOCK FLUSH 100 UNIT/ML IV SOLN
500.0000 [IU] | Freq: Once | INTRAVENOUS | Status: AC | PRN
  Administered 2023-05-19: 500 [IU]

## 2023-05-19 MED ORDER — PALONOSETRON HCL INJECTION 0.25 MG/5ML
0.2500 mg | Freq: Once | INTRAVENOUS | Status: AC
  Administered 2023-05-19: 0.25 mg via INTRAVENOUS
  Filled 2023-05-19: qty 5

## 2023-05-19 MED ORDER — SODIUM CHLORIDE 0.9 % IV SOLN
15.0000 mg/kg | Freq: Once | INTRAVENOUS | Status: AC
  Administered 2023-05-19: 1500 mg via INTRAVENOUS
  Filled 2023-05-19: qty 48

## 2023-05-19 MED ORDER — DEXAMETHASONE SODIUM PHOSPHATE 10 MG/ML IJ SOLN
10.0000 mg | Freq: Once | INTRAMUSCULAR | Status: AC
  Administered 2023-05-19: 10 mg via INTRAVENOUS
  Filled 2023-05-19: qty 1

## 2023-05-19 MED ORDER — DOXORUBICIN HCL LIPOSOMAL CHEMO INJECTION 2 MG/ML
22.5000 mg/m2 | Freq: Once | INTRAVENOUS | Status: AC
  Administered 2023-05-19: 50 mg via INTRAVENOUS
  Filled 2023-05-19: qty 25

## 2023-05-19 MED ORDER — INSULIN ASPART 100 UNIT/ML IJ SOLN
10.0000 [IU] | Freq: Once | INTRAMUSCULAR | Status: DC
  Filled 2023-05-19: qty 1

## 2023-05-19 MED ORDER — SODIUM CHLORIDE 0.9 % IV SOLN: INTRAVENOUS | Status: DC

## 2023-05-19 MED ORDER — DEXTROSE 5 % IV SOLN
INTRAVENOUS | Status: DC
Start: 2023-05-19 — End: 2023-05-19

## 2023-05-19 MED ORDER — SODIUM CHLORIDE 0.9 % IV SOLN
150.0000 mg | Freq: Once | INTRAVENOUS | Status: AC
  Administered 2023-05-19: 150 mg via INTRAVENOUS
  Filled 2023-05-19: qty 150

## 2023-05-19 MED ORDER — MAGNESIUM SULFATE 2 GM/50ML IV SOLN
2.0000 g | Freq: Once | INTRAVENOUS | Status: AC
  Administered 2023-05-19: 2 g via INTRAVENOUS
  Filled 2023-05-19: qty 50

## 2023-05-19 MED ORDER — SODIUM CHLORIDE 0.9 % IV SOLN
400.0000 mg | Freq: Once | INTRAVENOUS | Status: AC
  Administered 2023-05-19: 400 mg via INTRAVENOUS
  Filled 2023-05-19: qty 40

## 2023-05-19 MED ORDER — FAMOTIDINE IN NACL 20-0.9 MG/50ML-% IV SOLN
20.0000 mg | Freq: Once | INTRAVENOUS | Status: AC
  Administered 2023-05-19: 20 mg via INTRAVENOUS
  Filled 2023-05-19: qty 50

## 2023-05-19 MED ORDER — CETIRIZINE HCL 10 MG/ML IV SOLN
10.0000 mg | Freq: Once | INTRAVENOUS | Status: AC
  Administered 2023-05-19: 10 mg via INTRAVENOUS
  Filled 2023-05-19: qty 1

## 2023-05-19 NOTE — Patient Instructions (Signed)
 [facility] Discharge Instructions   You were seen and examined today by Dr. Ellin Saba.  He reviewed the results of your lab work which are normal/stable.   He reviewed the results of your CT scan which is stable. There is no evidence of the cancer growing or spreading.   We will proceed with your treatment today.   Return as scheduled.    Thank you for choosing [facility] at [facility] to provide your oncology and hematology care.  To afford each patient quality time with our provider, please arrive at least 15 minutes before your scheduled appointment time.   If you have a lab appointment with the Cancer Center please come in thru the Main Entrance and check in at the main information desk.  You need to re-schedule your appointment should you arrive 10 or more minutes late.  We strive to give you quality time with our providers, and arriving late affects you and other patients whose appointments are after yours.  Also, if you no show three or more times for appointments you may be dismissed from the clinic at the providers discretion.     Again, thank you for choosing [facility].  Our hope is that these requests will decrease the amount of time that you wait before being seen by our physicians.       _____________________________________________________________  Should you have questions after your visit to [facility], please contact our office at [phone] and follow the prompts.  Our office hours are 8:00 a.m. and 4:30 p.m. Monday - Friday.  Please note that voicemails left after 4:00 p.m. may not be returned until the following business day.  We are closed weekends and major holidays.  You do have access to a nurse 24-7, just call the main number to the clinic [phone] and do not press any options, hold on the line and a nurse will answer the phone.    For prescription refill requests,  have your pharmacy contact our office and allow 72 hours.    Due to Covid, you will need to wear a mask upon entering the hospital. If you do not have a mask, a mask will be given to you at the Main Entrance upon arrival. For doctor visits, patients may have 1 support person age 7 or older with them. For treatment visits, patients can not have anyone with them due to social distancing guidelines and our immunocompromised population.

## 2023-05-19 NOTE — Progress Notes (Signed)
 Patient tolerated chemotherapy with no complaints voiced.  Side effects with management reviewed with understanding verbalized.  Port site clean and dry with no bruising or swelling noted at site.  Good blood return noted before and after administration of chemotherapy.  Band aid applied.  Patient left in satisfactory condition with VSS and no s/s of distress noted. All follow ups as scheduled.   Venkat Ankney [employer]

## 2023-05-19 NOTE — Progress Notes (Signed)
 Patient has been examined by Dr. Ellin Saba. Vital signs and labs from 05/12/23 have been reviewed by MD - ANC, Creatinine, LFTs, hemoglobin, and platelets are within treatment parameters per M.D. - pt may proceed with treatment.  Primary RN and pharmacy notified.

## 2023-05-19 NOTE — Patient Instructions (Signed)
 [facility] [place] - A DEPT OF [facility]  Discharge Instructions: Thank you for choosing [facility] to provide your oncology and hematology care.  If you have a lab appointment with the Cancer Center - please note that after April 8th, 2024, all labs will be drawn in the cancer center.  You do not have to check in or register with the main entrance as you have in the past but will complete your check-in in the cancer center.  Wear comfortable clothing and clothing appropriate for easy access to any Portacath or PICC line.   We strive to give you quality time with your provider. You may need to reschedule your appointment if you arrive late (15 or more minutes).  Arriving late affects you and other patients whose appointments are after yours.  Also, if you miss three or more appointments without notifying the office, you may be dismissed from the clinic at the provider's discretion.      For prescription refill requests, have your pharmacy contact our office and allow 72 hours for refills to be completed.    Today you received the following chemotherapy and/or immunotherapy agents Vegzelma, doxil, carbo   To help prevent nausea and vomiting after your treatment, we encourage you to take your nausea medication as directed.  BELOW ARE SYMPTOMS THAT SHOULD BE REPORTED IMMEDIATELY: *FEVER GREATER THAN 100.4 F (38 C) OR HIGHER *CHILLS OR SWEATING *NAUSEA AND VOMITING THAT IS NOT CONTROLLED WITH YOUR NAUSEA MEDICATION *UNUSUAL SHORTNESS OF BREATH *UNUSUAL BRUISING OR BLEEDING *URINARY PROBLEMS (pain or burning when urinating, or frequent urination) *BOWEL PROBLEMS (unusual diarrhea, constipation, pain near the anus) TENDERNESS IN MOUTH AND THROAT WITH OR WITHOUT PRESENCE OF ULCERS (sore throat, sores in mouth, or a toothache) UNUSUAL RASH, SWELLING OR PAIN  UNUSUAL VAGINAL DISCHARGE OR ITCHING   Items with * indicate a potential emergency and should be  followed up as soon as possible or go to the Emergency Department if any problems should occur.  Please show the CHEMOTHERAPY ALERT CARD or IMMUNOTHERAPY ALERT CARD at check-in to the Emergency Department and triage nurse.  Should you have questions after your visit or need to cancel or reschedule your appointment, please contact [facility] [place] - A DEPT OF Eligha Bridegroom [facility] [phone]  and follow the prompts.  Office hours are 8:00 a.m. to 4:30 p.m. Monday - Friday. Please note that voicemails left after 4:00 p.m. may not be returned until the following business day.  We are closed weekends and major holidays. You have access to a nurse at all times for urgent questions. Please call the main number to the clinic [phone] and follow the prompts.  For any non-urgent questions, you may also contact your provider using MyChart. We now offer e-Visits for anyone 34 and older to request care online for non-urgent symptoms. For details visit [email or web address].   Also download the MyChart app! Go to the app store, search "MyChart", open the app, select [place], and log in with your MyChart username and password.

## 2023-05-20 ENCOUNTER — Other Ambulatory Visit: Payer: Self-pay

## 2023-05-21 ENCOUNTER — Other Ambulatory Visit (HOSPITAL_COMMUNITY): Payer: Self-pay | Admitting: Hematology

## 2023-05-21 ENCOUNTER — Inpatient Hospital Stay: Payer: Medicare Other

## 2023-05-21 VITALS — BP 153/70 | HR 72 | Temp 98.5°F | Resp 18

## 2023-05-21 DIAGNOSIS — C562 Malignant neoplasm of left ovary: Secondary | ICD-10-CM

## 2023-05-21 DIAGNOSIS — Z5112 Encounter for antineoplastic immunotherapy: Secondary | ICD-10-CM | POA: Diagnosis not present

## 2023-05-21 MED ORDER — TRAMADOL HCL 50 MG PO TABS: 50.0000 mg | ORAL_TABLET | Freq: Two times a day (BID) | ORAL | 0 refills | Status: DC | PRN

## 2023-05-21 MED ORDER — PEGFILGRASTIM-FPGK 6 MG/0.6ML ~~LOC~~ SOSY
6.0000 mg | PREFILLED_SYRINGE | Freq: Once | SUBCUTANEOUS | Status: AC
  Administered 2023-05-21: 6 mg via SUBCUTANEOUS
  Filled 2023-05-21: qty 0.6

## 2023-05-21 NOTE — Patient Instructions (Signed)

## 2023-05-21 NOTE — Progress Notes (Signed)
 Patient having a lot of muscle and joint pains.  Has used tramadol and hydrocodone in the past and feels the Tramadol worked somewhat better.  Has tried Tylenol without good relief and has also tried Claritin.  Message sent to Dr. Ellin Saba and instructed the patient to check with her drug store this evening with understanding verbalized.    Patient tolerated injection with no complaints voiced.  Site clean and dry with no bruising or swelling noted at site.  See MAR for details.  Band aid applied.  Patient stable during and after injection.  Vss with discharge and left in satisfactory condition with no s/s of distress noted.

## 2023-06-02 ENCOUNTER — Other Ambulatory Visit: Payer: Self-pay

## 2023-06-09 ENCOUNTER — Inpatient Hospital Stay: Payer: Medicare Other

## 2023-06-09 ENCOUNTER — Inpatient Hospital Stay: Payer: Medicare Other | Admitting: Hematology

## 2023-06-11 ENCOUNTER — Inpatient Hospital Stay: Payer: Medicare Other

## 2023-06-16 ENCOUNTER — Inpatient Hospital Stay: Attending: Hematology | Admitting: Hematology

## 2023-06-16 ENCOUNTER — Inpatient Hospital Stay

## 2023-06-16 VITALS — BP 151/71 | HR 98 | Temp 96.7°F | Resp 18

## 2023-06-16 DIAGNOSIS — C562 Malignant neoplasm of left ovary: Secondary | ICD-10-CM | POA: Insufficient documentation

## 2023-06-16 DIAGNOSIS — D649 Anemia, unspecified: Secondary | ICD-10-CM | POA: Diagnosis not present

## 2023-06-16 DIAGNOSIS — Z5112 Encounter for antineoplastic immunotherapy: Secondary | ICD-10-CM | POA: Diagnosis present

## 2023-06-16 DIAGNOSIS — Z79899 Other long term (current) drug therapy: Secondary | ICD-10-CM | POA: Insufficient documentation

## 2023-06-16 DIAGNOSIS — R300 Dysuria: Secondary | ICD-10-CM

## 2023-06-16 DIAGNOSIS — Z5111 Encounter for antineoplastic chemotherapy: Secondary | ICD-10-CM | POA: Diagnosis present

## 2023-06-16 LAB — URINALYSIS, DIPSTICK ONLY
Bilirubin Urine: NEGATIVE
Glucose, UA: NEGATIVE mg/dL
Ketones, ur: NEGATIVE mg/dL
Nitrite: POSITIVE — AB
Protein, ur: NEGATIVE mg/dL
Specific Gravity, Urine: 1.004 — ABNORMAL LOW (ref 1.005–1.030)
pH: 6 (ref 5.0–8.0)

## 2023-06-16 LAB — CBC WITH DIFFERENTIAL/PLATELET
Abs Immature Granulocytes: 0.03 10*3/uL (ref 0.00–0.07)
Basophils Absolute: 0 10*3/uL (ref 0.0–0.1)
Basophils Relative: 0 %
Eosinophils Absolute: 0.1 10*3/uL (ref 0.0–0.5)
Eosinophils Relative: 2 %
HCT: 23.8 % — ABNORMAL LOW (ref 36.0–46.0)
Hemoglobin: 8.2 g/dL — ABNORMAL LOW (ref 12.0–15.0)
Immature Granulocytes: 1 %
Lymphocytes Relative: 30 %
Lymphs Abs: 1.5 10*3/uL (ref 0.7–4.0)
MCH: 34.9 pg — ABNORMAL HIGH (ref 26.0–34.0)
MCHC: 34.5 g/dL (ref 30.0–36.0)
MCV: 101.3 fL — ABNORMAL HIGH (ref 80.0–100.0)
Monocytes Absolute: 0.8 10*3/uL (ref 0.1–1.0)
Monocytes Relative: 16 %
Neutro Abs: 2.5 10*3/uL (ref 1.7–7.7)
Neutrophils Relative %: 51 %
Platelets: 208 10*3/uL (ref 150–400)
RBC: 2.35 MIL/uL — ABNORMAL LOW (ref 3.87–5.11)
RDW: 16.2 % — ABNORMAL HIGH (ref 11.5–15.5)
WBC: 4.9 10*3/uL (ref 4.0–10.5)
nRBC: 0 % (ref 0.0–0.2)

## 2023-06-16 LAB — COMPREHENSIVE METABOLIC PANEL WITH GFR
ALT: 13 U/L (ref 0–44)
AST: 16 U/L (ref 15–41)
Albumin: 3.4 g/dL — ABNORMAL LOW (ref 3.5–5.0)
Alkaline Phosphatase: 88 U/L (ref 38–126)
Anion gap: 9 (ref 5–15)
BUN: 25 mg/dL — ABNORMAL HIGH (ref 8–23)
CO2: 25 mmol/L (ref 22–32)
Calcium: 9 mg/dL (ref 8.9–10.3)
Chloride: 99 mmol/L (ref 98–111)
Creatinine, Ser: 1.15 mg/dL — ABNORMAL HIGH (ref 0.44–1.00)
GFR, Estimated: 49 mL/min — ABNORMAL LOW (ref >60)
Glucose, Bld: 118 mg/dL — ABNORMAL HIGH (ref 70–99)
Potassium: 3.9 mmol/L (ref 3.5–5.1)
Sodium: 133 mmol/L — ABNORMAL LOW (ref 135–145)
Total Bilirubin: 0.5 mg/dL (ref 0.0–1.2)
Total Protein: 6.4 g/dL — ABNORMAL LOW (ref 6.5–8.1)

## 2023-06-16 LAB — MAGNESIUM: Magnesium: 1.6 mg/dL — ABNORMAL LOW (ref 1.7–2.4)

## 2023-06-16 MED ORDER — DEXAMETHASONE SODIUM PHOSPHATE 10 MG/ML IJ SOLN
10.0000 mg | Freq: Once | INTRAMUSCULAR | Status: AC
Start: 2023-06-16 — End: 2023-06-16
  Administered 2023-06-16: 10 mg via INTRAVENOUS
  Filled 2023-06-16: qty 1

## 2023-06-16 MED ORDER — SODIUM CHLORIDE 0.9 % IV SOLN
150.0000 mg | Freq: Once | INTRAVENOUS | Status: AC
  Administered 2023-06-16: 150 mg via INTRAVENOUS
  Filled 2023-06-16: qty 150

## 2023-06-16 MED ORDER — MAGNESIUM SULFATE 2 GM/50ML IV SOLN
2.0000 g | Freq: Once | INTRAVENOUS | Status: AC
  Administered 2023-06-16: 2 g via INTRAVENOUS
  Filled 2023-06-16: qty 50

## 2023-06-16 MED ORDER — SODIUM CHLORIDE 0.9 % IV SOLN
15.0000 mg/kg | Freq: Once | INTRAVENOUS | Status: AC
  Administered 2023-06-16: 1500 mg via INTRAVENOUS
  Filled 2023-06-16: qty 48

## 2023-06-16 MED ORDER — FAMOTIDINE IN NACL 20-0.9 MG/50ML-% IV SOLN
20.0000 mg | Freq: Once | INTRAVENOUS | Status: AC
  Administered 2023-06-16: 20 mg via INTRAVENOUS
  Filled 2023-06-16: qty 50

## 2023-06-16 MED ORDER — SODIUM CHLORIDE 0.9 % IV SOLN: INTRAVENOUS | Status: DC

## 2023-06-16 MED ORDER — MAGNESIUM SULFATE 2 GM/50ML IV SOLN: 2.0000 g | Freq: Once | INTRAVENOUS | Status: DC

## 2023-06-16 MED ORDER — DEXTROSE 5 % IV SOLN
INTRAVENOUS | Status: DC
Start: 2023-06-16 — End: 2023-06-16

## 2023-06-16 MED ORDER — CETIRIZINE HCL 10 MG/ML IV SOLN
10.0000 mg | Freq: Once | INTRAVENOUS | Status: AC
  Administered 2023-06-16: 10 mg via INTRAVENOUS
  Filled 2023-06-16: qty 1

## 2023-06-16 MED ORDER — SODIUM CHLORIDE 0.9% FLUSH
10.0000 mL | INTRAVENOUS | Status: DC | PRN
Start: 2023-06-16 — End: 2023-06-16
  Administered 2023-06-16: 10 mL via INTRAVENOUS

## 2023-06-16 MED ORDER — SODIUM CHLORIDE 0.9% FLUSH
10.0000 mL | INTRAVENOUS | Status: DC | PRN
  Administered 2023-06-16: 10 mL

## 2023-06-16 MED ORDER — DOXORUBICIN HCL LIPOSOMAL CHEMO INJECTION 2 MG/ML
22.5000 mg/m2 | Freq: Once | INTRAVENOUS | Status: AC
  Administered 2023-06-16: 50 mg via INTRAVENOUS
  Filled 2023-06-16: qty 25

## 2023-06-16 MED ORDER — SODIUM CHLORIDE 0.9 % IV SOLN
400.0000 mg | Freq: Once | INTRAVENOUS | Status: AC
  Administered 2023-06-16: 400 mg via INTRAVENOUS
  Filled 2023-06-16: qty 40

## 2023-06-16 MED ORDER — HEPARIN SOD (PORK) LOCK FLUSH 100 UNIT/ML IV SOLN
500.0000 [IU] | Freq: Once | INTRAVENOUS | Status: AC | PRN
  Administered 2023-06-16: 500 [IU]

## 2023-06-16 MED ORDER — PALONOSETRON HCL INJECTION 0.25 MG/5ML
0.2500 mg | Freq: Once | INTRAVENOUS | Status: AC
  Administered 2023-06-16: 0.25 mg via INTRAVENOUS
  Filled 2023-06-16: qty 5

## 2023-06-16 NOTE — Progress Notes (Signed)
 [facility] [address] [place] [address]    Clinic Day:  06/16/2023  Referring physician: Adolphus Birchwood, MD  Patient Care Team: Adolphus Birchwood, MD as PCP - General (Gynecologic Oncology) Doreatha Massed, MD as Medical Oncologist (Medical Oncology) Therese Sarah, RN as Oncology Nurse Navigator (Oncology) [name], Consuello Bossier, NP as Nurse Practitioner (Cardiothoracic Surgery)   ASSESSMENT & PLAN:   Assessment: 1. Stage IIIb high-grade serous ovarian carcinoma: - Presentation with abdominal distention and bloating sensation. - CT abdomen at outside hospital on 12/05/2020 with findings concerning for ovarian neoplasm. - CT chest without contrast on 12/18/2020 with scattered solid pulmonary nodules measuring up to 3 mm, indeterminate. - CA125 on 12/05/2020-10000 - 12/18/2020 exploratory laparotomy, bilateral salpingo-oophorectomy, infra gastric omentectomy, R0 primary tumor debulking by Dr. Andrey Farmer at [facility]. - Pathology-left ovary high-grade serous carcinoma, greatest dimension 26.5 cm, left ovarian capsule intact.  Fallopian tube surface involvement present, omentum involvement present, largest extrapelvic peritoneal focus-macroscopic, peritoneal/ascitic fluid involvement negative.  PT3BPNX.  FIGO stage IIIb. - She was evaluated for FLORA 5 clinical trial, found not to be a candidate. - Germline mutation testing was negative. - 6 cycles of carboplatin and paclitaxel from 02/11/2021 through 05/28/2021.  She has refused niraparib maintenance. - CTAP on 11/10/2022 showed recurrence in the left pelvic sidewall. - She met with Dr. Andrey Farmer and felt not a candidate for debulking.  She is considered platinum sensitive. - Carboplatin, Doxil started on 01/12/2023    2. Social/family history: - She is seen with her daughter-in-law today.  She lives by herself.  She ambulates with the help of cane.  She retired after working at [employer].  She is non-smoker. -  Brother had prostate cancer.  Another brother had throat cancer.  Sister had kidney cancer.  Grandson had bladder cancer at age 62.  Paternal uncle had head and neck cancer.    Plan: 1. Stage IIIb high-grade serous ovarian carcinoma: - CTAP (05/11/2020): Diminished size of soft tissue nodule at the left vaginal cuff measuring 1.8 x 1.5 cm, previously 3.3 x 2.4 cm.  Unchanged small nodule just anteriorly measuring 0.5 cm.  Unchanged 0.6 cm peritoneal nodule in the left upper quadrant adjacent to the splenic flexure.  No new evidence of adenopathy or metastatic disease. - She reported dysuria.  We have done UA today which showed findings consistent with infection.  Will prescribe Macrobid. - Labs today: Normal LFTs.  Creatinine 1.15 and stable.  CBC grossly normal with mild worsening of anemia with hemoglobin 8.2, from myelosuppression.  CA125 is normal at 15. - Proceed with cycle 6 today.  RTC 4 weeks for follow-up.  Will plan on maintenance bevacizumab every 3 weeks.  Will plan on repeating CT scan 3 months after the last.   2.  Hypomagnesemia: - Continue magnesium twice daily.  Magnesium improved to 1.6 today.   3.  Peripheral neuropathy: - Since last treatment numbness in the feet and fingertips is stable.  4.  High risk drug monitoring: - Last 2D echo on 01/07/2023: LVEF 65 to 70%.  No signs of PND or orthopnea.    Orders Placed This Encounter  Procedures   Urine culture   CA 125    Standing Status:   Future    Expected Date:   10/06/2023    Expiration Date:   10/05/2024   Magnesium    Standing Status:   Future    Expected Date:   10/06/2023    Expiration Date:  10/05/2024   CBC with Differential    Standing Status:   Future    Expected Date:   10/06/2023    Expiration Date:   10/05/2024   CA 125    Standing Status:   Future    Expected Date:   10/27/2023    Expiration Date:   10/26/2024   Magnesium    Standing Status:   Future    Expected Date:   10/27/2023    Expiration Date:    10/26/2024   CBC with Differential    Standing Status:   Future    Expected Date:   10/27/2023    Expiration Date:   10/26/2024   Urinalysis, dipstick only    Standing Status:   Future    Expected Date:   10/27/2023    Expiration Date:   10/26/2024      Nadeen Augusta Teague,acting as a scribe for Paulett Boros, MD.,have documented all relevant documentation on the behalf of Paulett Boros, MD,as directed by  Paulett Boros, MD while in the presence of Paulett Boros, MD.  I, Paulett Boros MD, have reviewed the above documentation for accuracy and completeness, and I agree with the above.     Paulett Boros, MD   4/15/202512:52 PM  CHIEF COMPLAINT:   Diagnosis: ovarian cancer    Cancer Staging  Ovarian cancer on left [facility]) Staging form: Ovary, Fallopian Tube, and Primary Peritoneal Carcinoma, AJCC 8th Edition - Clinical stage from 01/30/2021: FIGO Stage IIIB (cT3b, cNX, cM0) - Signed by Paulett Boros, MD on 01/30/2021    Prior Therapy: 1. Exploratory laparotomy, BSO, and infra-gastric omentectomy on 12/19/2020 with Dr. Pearly Bound  2. Carboplatin (AUC 6) / Paclitaxel (175) q21d x 6 cycles 02/11/2021 - 05/28/2021   Current Therapy:  carboplatin and Doxil with bevacizumab    HISTORY OF PRESENT ILLNESS:   Oncology History  Ovarian cancer on left [facility])  01/07/2021 Initial Diagnosis   Ovarian cancer on left [facility])   01/30/2021 Cancer Staging   Staging form: Ovary, Fallopian Tube, and Primary Peritoneal Carcinoma, AJCC 8th Edition - Clinical stage from 01/30/2021: FIGO Stage IIIB (cT3b, cNX, cM0) - Signed by Paulett Boros, MD on 01/30/2021 Histologic grade (G): G3 Histologic grading system: 4 grade system   02/11/2021 - 05/28/2021 Chemotherapy   Patient is on Treatment Plan : OVARIAN Carboplatin (AUC 6) / Paclitaxel (175) q21d x 6 cycles     01/12/2023 -  Chemotherapy   Patient is on Treatment Plan : OVARIAN Liposomal Doxorubicin D1 +  Carboplatin D1 + Bevacizumab D1,15 q28d / Bevacizumab q21d        INTERVAL HISTORY:   Rebecca [name] is a 78 y.o. female presenting to clinic today for follow up of ovarian cancer. She was last seen by me on 05/19/23.  Today, she states that she is doing well overall. Her appetite level is at 100%. Her energy level is at 50%. Toyna is accompanied by her husband and a family member.   Sharlisa reports dysuria. She states the last round of antibiotics I prescribed improved UTI symptoms. Her neuropathy in the form of numbness in the feet and fingertips are stable. She takes Magnesium as prescribed.   PAST MEDICAL HISTORY:   Past Medical History: Past Medical History:  Diagnosis Date   Anemia    CAD (coronary artery disease)    DM2 (diabetes mellitus, type 2) (HCC)    GERD (gastroesophageal reflux disease)    HTN (hypertension)    Ovarian ca (HCC)    Port-A-Cath in  place 02/02/2021    Surgical History: Past Surgical History:  Procedure Laterality Date   IR IMAGING GUIDED PORT INSERTION  02/08/2021    Social History: Social History   Socioeconomic History   Marital status: Single    Spouse name: Not on file   Number of children: Not on file   Years of education: Not on file   Highest education level: Not on file  Occupational History   Not on file  Tobacco Use   Smoking status: Not on file   Smokeless tobacco: Not on file  Substance and Sexual Activity   Alcohol use: Not on file   Drug use: Not on file   Sexual activity: Not on file  Other Topics Concern   Not on file  Social History Narrative   Not on file   Social Drivers of Health   Financial Resource Strain: Low Risk  (02/05/2021)   Overall Financial Resource Strain (CARDIA)    Difficulty of Paying Living Expenses: Not hard at all  Food Insecurity: No Food Insecurity (02/05/2021)   Hunger Vital Sign    Worried About Running Out of Food in the Last Year: Never true    Ran Out of Food in the Last Year: Never true   Transportation Needs: Unmet Transportation Needs (02/05/2021)   PRAPARE - Administrator, Civil Service (Medical): Yes    Lack of Transportation (Non-Medical): No  Physical Activity: Not on file  Stress: Stress Concern Present (02/05/2021)   [employer] of Occupational Health - Occupational Stress Questionnaire    Feeling of Stress : To some extent  Social Connections: Moderately Isolated (02/05/2021)   Social Connection and Isolation Panel [NHANES]    Frequency of Communication with Friends and Family: More than three times a week    Frequency of Social Gatherings with Friends and Family: More than three times a week    Attends Religious Services: More than 4 times per year    Active Member of [employer] or Organizations: No    Attends Banker Meetings: Never    Marital Status: Widowed  Intimate Partner Violence: Not on file    Family History: No family history on file.  Current Medications:  Current Outpatient Medications:    acetaminophen (TYLENOL) 325 MG tablet, Take 3 tablets (975 mg total) by mouth every 6 (six) hours as needed for moderate pain., Disp: 360 tablet, Rfl: 2   atorvastatin (LIPITOR) 40 MG tablet, Take 40 mg by mouth at bedtime., Disp: , Rfl:    azelastine (ASTELIN) 0.1 % nasal spray, Place 1 spray into both nostrils 2 (two) times daily as needed for allergies., Disp: , Rfl:    B-D ULTRAFINE III SHORT PEN 31G X 8 MM MISC, SMARTSIG:1 Each SUB-Q Daily, Disp: , Rfl:    baclofen (LIORESAL) 10 MG tablet, Take 10 mg by mouth 3 (three) times daily as needed for muscle spasms., Disp: , Rfl:    clopidogrel (PLAVIX) 75 MG tablet, Take 75 mg by mouth daily., Disp: , Rfl:    diphenhydrAMINE-zinc acetate (BENADRYL) cream, Apply 1 application topically 3 (three) times daily as needed for itching., Disp: , Rfl:    docusate sodium (COLACE) 100 MG capsule, Take 1 capsule (100 mg total) by mouth daily as needed for mild constipation., Disp: 30 capsule, Rfl:  1   ferrous sulfate 325 (65 FE) MG tablet, Take 325 mg by mouth every evening., Disp: , Rfl:    furosemide (LASIX) 20 MG tablet, Take 1 tablet (  20 mg total) by mouth in the morning., Disp: 30 tablet, Rfl: 2   gabapentin (NEURONTIN) 300 MG capsule, Take 1 capsule (300 mg total) by mouth 3 (three) times daily., Disp: 60 capsule, Rfl: 2   lidocaine-prilocaine (EMLA) cream, Apply to affected area once, Disp: 30 g, Rfl: 3   losartan (COZAAR) 50 MG tablet, Take 50 mg by mouth at bedtime., Disp: , Rfl:    magnesium oxide (MAG-OX) 400 (240 Mg) MG tablet, Take 1 tablet (400 mg total) by mouth 2 (two) times daily., Disp: 60 tablet, Rfl: 6   Misc. Devices MISC, Please provide patient with 3 diabetic nutritional supplements per day., Disp: 1 each, Rfl: 11   pantoprazole (PROTONIX) 20 MG tablet, Take 20 mg by mouth daily., Disp: , Rfl:    pioglitazone (ACTOS) 15 MG tablet, Take 15 mg by mouth daily., Disp: , Rfl:    predniSONE (DELTASONE) 50 MG tablet, Take 13hrs, 7hrs and 1hr before port placementTake 13hrs, 7hrs and 1hr before port placement, Disp: 3 tablet, Rfl: 0   prochlorperazine (COMPAZINE) 10 MG tablet, Take 1 tablet (10 mg total) by mouth every 6 (six) hours as needed for nausea or vomiting., Disp: 60 tablet, Rfl: 3   SOLIQUA 100-33 UNT-MCG/ML SOPN, Inject 45 Units into the skin daily., Disp: , Rfl:    Tetrahydrozoline HCl (REDNESS RELIEVER EYE DROPS OP), Place 1 drop into both eyes daily as needed (redness)., Disp: , Rfl:    traMADol (ULTRAM) 50 MG tablet, Take 1 tablet (50 mg total) by mouth every 12 (twelve) hours as needed., Disp: 60 tablet, Rfl: 0 No current facility-administered medications for this visit.  Facility-Administered Medications Ordered in Other Visits:    0.9 %  sodium chloride infusion, , Intravenous, Continuous, Doreatha Massed, MD, Last Rate: 10 mL/hr at 06/16/23 1106, New Bag at 06/16/23 1106   CARBOplatin (PARAPLATIN) 400 mg in sodium chloride 0.9 % 250 mL chemo infusion,  400 mg, Intravenous, Once, Doreatha Massed, MD   dextrose 5 % solution, , Intravenous, Continuous, Doreatha Massed, MD   DOXOrubicin HCL LIPOSOMAL (DOXIL) 50 mg in dextrose 5 % 250 mL chemo infusion, 22.5 mg/m2 (Order-Specific), Intravenous, Once, Doreatha Massed, MD   heparin lock flush 100 unit/mL, 500 Units, Intracatheter, Once PRN, Doreatha Massed, MD   magnesium sulfate IVPB 2 g 50 mL, 2 g, Intravenous, Once, Doreatha Massed, MD   sodium chloride flush (NS) 0.9 % injection 10 mL, 10 mL, Intracatheter, PRN, Doreatha Massed, MD   Allergies: Allergies  Allergen Reactions   Aspirin Hives   Mometasone Itching   Naproxen Sodium Itching   Penicillin G Swelling   Triamcinolone Itching   Codeine Palpitations   Ibuprofen Rash   Iodinated Contrast Media Itching and Rash    REVIEW OF SYSTEMS:   Review of Systems  Constitutional:  Negative for chills, fatigue and fever.  HENT:   Negative for lump/mass, mouth sores, nosebleeds, sore throat and trouble swallowing.   Eyes:  Negative for eye problems.  Respiratory:  Positive for cough and shortness of breath.   Cardiovascular:  Positive for chest pain (occasional left-sided). Negative for leg swelling and palpitations.  Gastrointestinal:  Positive for constipation. Negative for abdominal pain, diarrhea, nausea and vomiting.       +BRBPR  Genitourinary:  Positive for dysuria. Negative for bladder incontinence, difficulty urinating, frequency, hematuria and nocturia.   Musculoskeletal:  Negative for arthralgias, back pain, flank pain, myalgias and neck pain.  Skin:  Negative for itching and rash.  Neurological:  Positive for dizziness (positional) and numbness (in hands and feet). Negative for headaches.  Hematological:  Does not bruise/bleed easily.  Psychiatric/Behavioral:  Positive for sleep disturbance. Negative for depression and suicidal ideas. The patient is not nervous/anxious.   All other systems reviewed  and are negative.    VITALS:   There were no vitals taken for this visit.  Wt Readings from Last 3 Encounters:  06/16/23 208 lb 8.9 oz (94.6 kg)  05/19/23 208 lb 15.9 oz (94.8 kg)  04/21/23 207 lb 14.3 oz (94.3 kg)    There is no height or weight on file to calculate BMI.  Performance status (ECOG): 1 - Symptomatic but completely ambulatory  PHYSICAL EXAM:   Physical Exam Vitals and nursing note reviewed. Exam conducted with a chaperone present.  Constitutional:      Appearance: Normal appearance.  Cardiovascular:     Rate and Rhythm: Normal rate and regular rhythm.     Pulses: Normal pulses.     Heart sounds: Normal heart sounds.  Pulmonary:     Effort: Pulmonary effort is normal.     Breath sounds: Normal breath sounds.  Abdominal:     Palpations: Abdomen is soft. There is no hepatomegaly, splenomegaly or mass.     Tenderness: There is no abdominal tenderness.  Musculoskeletal:     Right lower leg: No edema.     Left lower leg: No edema.  Lymphadenopathy:     Cervical: No cervical adenopathy.     Right cervical: No superficial, deep or posterior cervical adenopathy.    Left cervical: No superficial, deep or posterior cervical adenopathy.     Upper Body:     Right upper body: No supraclavicular or axillary adenopathy.     Left upper body: No supraclavicular or axillary adenopathy.  Neurological:     General: No focal deficit present.     Mental Status: She is alert and oriented to person, place, and time.  Psychiatric:        Mood and Affect: Mood normal.        Behavior: Behavior normal.     LABS:      Latest Ref Rng & Units 06/16/2023    9:10 AM 05/12/2023    1:59 PM 04/21/2023    8:45 AM  CBC  WBC 4.0 - 10.5 K/uL 4.9  7.7  5.1   Hemoglobin 12.0 - 15.0 g/dL 8.2  9.7  9.3   Hematocrit 36.0 - 46.0 % 23.8  28.5  27.2   Platelets 150 - 400 K/uL 208  142  254       Latest Ref Rng & Units 06/16/2023    9:10 AM 05/12/2023    1:59 PM 04/21/2023    8:45 AM  CMP   Glucose 70 - 99 mg/dL 409  811  914   BUN 8 - 23 mg/dL 25  21  20    Creatinine 0.44 - 1.00 mg/dL 7.82  9.56  2.13   Sodium 135 - 145 mmol/L 133  132  138   Potassium 3.5 - 5.1 mmol/L 3.9  4.4  3.7   Chloride 98 - 111 mmol/L 99  100  105   CO2 22 - 32 mmol/L 25  21  21    Calcium 8.9 - 10.3 mg/dL 9.0  9.7  9.3   Total Protein 6.5 - 8.1 g/dL 6.4  7.4  7.2   Total Bilirubin 0.0 - 1.2 mg/dL 0.5  0.6  0.7   Alkaline Phos 38 - 126  U/L 88  93  79   AST 15 - 41 U/L 16  21  16    ALT 0 - 44 U/L 13  18  13       No results found for: "CEA1", "CEA" / No results found for: "CEA1", "CEA" No results found for: "PSA1" No results found for: "UJW119" Lab Results  Component Value Date   CAN125 15.0 05/12/2023    No results found for: "TOTALPROTELP", "ALBUMINELP", "A1GS", "A2GS", "BETS", "BETA2SER", "GAMS", "MSPIKE", "SPEI" Lab Results  Component Value Date   TIBC 343 11/06/2022   TIBC 365 06/24/2022   FERRITIN 301 11/06/2022   FERRITIN 186 06/24/2022   IRONPCTSAT 19 11/06/2022   IRONPCTSAT 19 06/24/2022   Lab Results  Component Value Date   LDH 119 02/11/2021     STUDIES:   No results found.

## 2023-06-16 NOTE — Progress Notes (Signed)
 Patient has been examined by Dr. Ellin Saba. Vital signs and labs have been reviewed by MD - ANC, Creatinine, LFTs, hemoglobin, and platelets are within treatment parameters per M.D. - pt may proceed with treatment.  Primary RN and pharmacy notified.

## 2023-06-16 NOTE — Patient Instructions (Signed)
 [facility] at [facility] Discharge Instructions   You were seen and examined today by Dr. Cheree Cords.  He reviewed the results of your lab work which are mostly normal/stable. Your urine is showing you have an infection. We sent a urine culture to see what antibiotic will be best to treat this infection. Those results will be back tomorrow.   We will proceed with your treatment today. After today's treatment, we will drop the chemo and just give the Avastin every 3 weeks (this is the 30 minute infusion).   Return as scheduled.    Thank you for choosing [facility] at [facility] to provide your oncology and hematology care.  To afford each patient quality time with our provider, please arrive at least 15 minutes before your scheduled appointment time.   If you have a lab appointment with the Cancer Center please come in thru the Main Entrance and check in at the main information desk.  You need to re-schedule your appointment should you arrive 10 or more minutes late.  We strive to give you quality time with our providers, and arriving late affects you and other patients whose appointments are after yours.  Also, if you no show three or more times for appointments you may be dismissed from the clinic at the providers discretion.     Again, thank you for choosing [facility].  Our hope is that these requests will decrease the amount of time that you wait before being seen by our physicians.       _____________________________________________________________  Should you have questions after your visit to [facility], please contact our office at [phone] and follow the prompts.  Our office hours are 8:00 a.m. and 4:30 p.m. Monday - Friday.  Please note that voicemails left after 4:00 p.m. may not be returned until the following business day.  We are closed weekends and major holidays.  You do have access to a nurse 24-7,  just call the main number to the clinic [phone] and do not press any options, hold on the line and a nurse will answer the phone.    For prescription refill requests, have your pharmacy contact our office and allow 72 hours.    Due to Covid, you will need to wear a mask upon entering the hospital. If you do not have a mask, a mask will be given to you at the Main Entrance upon arrival. For doctor visits, patients may have 1 support person age 26 or older with them. For treatment visits, patients can not have anyone with them due to social distancing guidelines and our immunocompromised population.

## 2023-06-16 NOTE — Patient Instructions (Signed)
 [facility] [place] - A DEPT OF [facility]  Discharge Instructions: Thank you for choosing [facility] to provide your oncology and hematology care.  If you have a lab appointment with the Cancer Center - please note that after April 8th, 2024, all labs will be drawn in the cancer center.  You do not have to check in or register with the main entrance as you have in the past but will complete your check-in in the cancer center.  Wear comfortable clothing and clothing appropriate for easy access to any Portacath or PICC line.   We strive to give you quality time with your provider. You may need to reschedule your appointment if you arrive late (15 or more minutes).  Arriving late affects you and other patients whose appointments are after yours.  Also, if you miss three or more appointments without notifying the office, you may be dismissed from the clinic at the provider's discretion.      For prescription refill requests, have your pharmacy contact our office and allow 72 hours for refills to be completed.    Today you received the following chemotherapy and/or immunotherapy agents Vegzelma doxil carboplatin      To help prevent nausea and vomiting after your treatment, we encourage you to take your nausea medication as directed.  BELOW ARE SYMPTOMS THAT SHOULD BE REPORTED IMMEDIATELY: *FEVER GREATER THAN 100.4 F (38 C) OR HIGHER *CHILLS OR SWEATING *NAUSEA AND VOMITING THAT IS NOT CONTROLLED WITH YOUR NAUSEA MEDICATION *UNUSUAL SHORTNESS OF BREATH *UNUSUAL BRUISING OR BLEEDING *URINARY PROBLEMS (pain or burning when urinating, or frequent urination) *BOWEL PROBLEMS (unusual diarrhea, constipation, pain near the anus) TENDERNESS IN MOUTH AND THROAT WITH OR WITHOUT PRESENCE OF ULCERS (sore throat, sores in mouth, or a toothache) UNUSUAL RASH, SWELLING OR PAIN  UNUSUAL VAGINAL DISCHARGE OR ITCHING   Items with * indicate a potential emergency and  should be followed up as soon as possible or go to the Emergency Department if any problems should occur.  Please show the CHEMOTHERAPY ALERT CARD or IMMUNOTHERAPY ALERT CARD at check-in to the Emergency Department and triage nurse.  Should you have questions after your visit or need to cancel or reschedule your appointment, please contact [facility] [place] - A DEPT OF [facility] [phone]  and follow the prompts.  Office hours are 8:00 a.m. to 4:30 p.m. Monday - Friday. Please note that voicemails left after 4:00 p.m. may not be returned until the following business day.  We are closed weekends and major holidays. You have access to a nurse at all times for urgent questions. Please call the main number to the clinic [phone] and follow the prompts.  For any non-urgent questions, you may also contact your provider using MyChart. We now offer e-Visits for anyone 65 and older to request care online for non-urgent symptoms. For details visit [email or web address].   Also download the MyChart app! Go to the app store, search "MyChart", open the app, select [facility], and log in with your MyChart username and password.

## 2023-06-16 NOTE — Progress Notes (Signed)
 Patient presents today for Vegzelma/Doxil/Carboplatin per providers order.  Vital signs and labs reviewed by MD. Message received from Chapman Moss RN/Dr. Ellin Saba patient okay for treatment.  Treatment given today per MD orders.  Stable during infusion without adverse affects.  Vital signs stable.  No complaints at this time.  Discharge from clinic ambulatory in stable condition.  Alert and oriented X 3.  Follow up with [facility] as scheduled.

## 2023-06-17 LAB — CA 125: Cancer Antigen (CA) 125: 12.5 U/mL (ref 0.0–38.1)

## 2023-06-18 ENCOUNTER — Other Ambulatory Visit: Payer: Self-pay | Admitting: *Deleted

## 2023-06-18 ENCOUNTER — Inpatient Hospital Stay

## 2023-06-18 VITALS — BP 156/61 | HR 85 | Temp 98.0°F | Resp 20

## 2023-06-18 DIAGNOSIS — C562 Malignant neoplasm of left ovary: Secondary | ICD-10-CM

## 2023-06-18 DIAGNOSIS — Z5112 Encounter for antineoplastic immunotherapy: Secondary | ICD-10-CM | POA: Diagnosis not present

## 2023-06-18 LAB — URINE CULTURE: Culture: >=100000 — AB

## 2023-06-18 MED ORDER — NITROFURANTOIN MONOHYD MACRO 100 MG PO CAPS: 100.0000 mg | ORAL_CAPSULE | Freq: Two times a day (BID) | ORAL | 0 refills | Status: AC

## 2023-06-18 MED ORDER — PEGFILGRASTIM-FPGK 6 MG/0.6ML ~~LOC~~ SOSY
6.0000 mg | PREFILLED_SYRINGE | Freq: Once | SUBCUTANEOUS | Status: AC
Start: 2023-06-18 — End: 2023-06-18
  Administered 2023-06-18: 6 mg via SUBCUTANEOUS
  Filled 2023-06-18: qty 0.6

## 2023-06-18 NOTE — Progress Notes (Signed)
 Rebecca [name] presents today for injection per the provider's orders.  STIMUFEND administration without incident; injection site WNL; see MAR for injection details.  Patient tolerated procedure well and without incident.  No questions or complaints noted at this time. Discharged from clinic ambulatory in stable condition. Alert and oriented x 3. F/U with [facility] as scheduled.

## 2023-07-09 ENCOUNTER — Encounter: Payer: Self-pay | Admitting: Hematology

## 2023-07-09 ENCOUNTER — Other Ambulatory Visit: Payer: Self-pay

## 2023-07-13 NOTE — Progress Notes (Incomplete)
 [facility] [address] [place] [address]    Clinic Day:  07/13/2023  Referring physician: Alphonso Aschoff, MD  Patient Care Team: Alphonso Aschoff, MD as PCP - General (Gynecologic Oncology) Paulett Boros, MD as Medical Oncologist (Medical Oncology) Gerhard Knuckles, RN as Oncology Nurse Navigator (Oncology) [name], Dorie Garfinkel, NP as Nurse Practitioner (Cardiothoracic Surgery)   ASSESSMENT & PLAN:   Assessment: 1. Stage IIIb high-grade serous ovarian carcinoma: - Presentation with abdominal distention and bloating sensation. - CT abdomen at outside hospital on 12/05/2020 with findings concerning for ovarian neoplasm. - CT chest without contrast on 12/18/2020 with scattered solid pulmonary nodules measuring up to 3 mm, indeterminate. - CA125 on 12/05/2020-10000 - 12/18/2020 exploratory laparotomy, bilateral salpingo-oophorectomy, infra gastric omentectomy, R0 primary tumor debulking by Dr. Pearly Bound at [facility]. - Pathology-left ovary high-grade serous carcinoma, greatest dimension 26.5 cm, left ovarian capsule intact.  Fallopian tube surface involvement present, omentum involvement present, largest extrapelvic peritoneal focus-macroscopic, peritoneal/ascitic fluid involvement negative.  PT3BPNX.  FIGO stage IIIb. - She was evaluated for FLORA 5 clinical trial, found not to be a candidate. - Germline mutation testing was negative. - 6 cycles of carboplatin  and paclitaxel  from 02/11/2021 through 05/28/2021.  She has refused niraparib  maintenance. - CTAP on 11/10/2022 showed recurrence in the left pelvic sidewall. - She met with Dr. Pearly Bound and felt not a candidate for debulking.  She is considered platinum sensitive. - Carboplatin , Doxil  started on 01/12/2023    2. Social/family history: - She is seen with her daughter-in-law today.  She lives by herself.  She ambulates with the help of cane.  She retired after working at [employer].  She is non-smoker. -  Brother had prostate cancer.  Another brother had throat cancer.  Sister had kidney cancer.  Grandson had bladder cancer at age 25.  Paternal uncle had head and neck cancer.    Plan: 1. Stage IIIb high-grade serous ovarian carcinoma: - CTAP (05/11/2020): Diminished size of soft tissue nodule at the left vaginal cuff measuring 1.8 x 1.5 cm, previously 3.3 x 2.4 cm.  Unchanged small nodule just anteriorly measuring 0.5 cm.  Unchanged 0.6 cm peritoneal nodule in the left upper quadrant adjacent to the splenic flexure.  No new evidence of adenopathy or metastatic disease. - She reported dysuria.  We have done UA today which showed findings consistent with infection.  Will prescribe Macrobid . - Labs today: Normal LFTs.  Creatinine 1.15 and stable.  CBC grossly normal with mild worsening of anemia with hemoglobin 8.2, from myelosuppression.  CA125 is normal at 15. - Proceed with cycle 6 today.  RTC 4 weeks for follow-up.  Will plan on maintenance bevacizumab  every 3 weeks.  Will plan on repeating CT scan 3 months after the last.   2.  Hypomagnesemia: - Continue magnesium  twice daily.  Magnesium  improved to 1.6 today.   3.  Peripheral neuropathy: - Since last treatment numbness in the feet and fingertips is stable.  4.  High risk drug monitoring: - Last 2D echo on 01/07/2023: LVEF 65 to 70%.  No signs of PND or orthopnea.    No orders of the defined types were placed in this encounter.     Nadeen Augusta Teague,acting as a Neurosurgeon for Paulett Boros, MD.,have documented all relevant documentation on the behalf of Paulett Boros, MD,as directed by  Paulett Boros, MD while in the presence of Paulett Boros, MD.  [place]   5/12/20254:40  PM  CHIEF COMPLAINT:   Diagnosis: ovarian cancer    Cancer Staging  Ovarian cancer on left [facility]) Staging form: Ovary, Fallopian Tube, and Primary Peritoneal Carcinoma, AJCC 8th Edition - Clinical stage from 01/30/2021:  FIGO Stage IIIB (cT3b, cNX, cM0) - Signed by Paulett Boros, MD on 01/30/2021    Prior Therapy: 1. Exploratory laparotomy, BSO, and infra-gastric omentectomy on 12/19/2020 with Dr. Pearly Bound  2. Carboplatin  (AUC 6) / Paclitaxel  (175) q21d x 6 cycles 02/11/2021 - 05/28/2021   Current Therapy:  carboplatin  and Doxil  with bevacizumab     HISTORY OF PRESENT ILLNESS:   Oncology History  Ovarian cancer on left [facility])  01/07/2021 Initial Diagnosis   Ovarian cancer on left [facility])   01/30/2021 Cancer Staging   Staging form: Ovary, Fallopian Tube, and Primary Peritoneal Carcinoma, AJCC 8th Edition - Clinical stage from 01/30/2021: FIGO Stage IIIB (cT3b, cNX, cM0) - Signed by Paulett Boros, MD on 01/30/2021 Histologic grade (G): G3 Histologic grading system: 4 grade system   02/11/2021 - 05/28/2021 Chemotherapy   Patient is on Treatment Plan : OVARIAN Carboplatin  (AUC 6) / Paclitaxel  (175) q21d x 6 cycles     01/12/2023 -  Chemotherapy   Patient is on Treatment Plan : OVARIAN Liposomal Doxorubicin  D1 + Carboplatin  D1 + Bevacizumab  D1,15 q28d / Bevacizumab  q21d        INTERVAL HISTORY:   Rebecca [name] is a 78 y.o. female presenting to clinic today for follow up of ovarian cancer. She was last seen by me on 06/16/23.  Since her last visit, she presented to the ED on 06/26/23 for a fall.   Today, she states that she is doing well overall. Her appetite level is at ***%. Her energy level is at ***%.  PAST MEDICAL HISTORY:   Past Medical History: Past Medical History:  Diagnosis Date   Anemia    CAD (coronary artery disease)    DM2 (diabetes mellitus, type 2) (HCC)    GERD (gastroesophageal reflux disease)    HTN (hypertension)    Ovarian ca (HCC)    Port-A-Cath in place 02/02/2021    Surgical History: Past Surgical History:  Procedure Laterality Date   IR IMAGING GUIDED PORT INSERTION  02/08/2021    Social History: Social History   Socioeconomic History   Marital status: Single     Spouse name: Not on file   Number of children: Not on file   Years of education: Not on file   Highest education level: Not on file  Occupational History   Not on file  Tobacco Use   Smoking status: Not on file   Smokeless tobacco: Not on file  Substance and Sexual Activity   Alcohol use: Not on file   Drug use: Not on file   Sexual activity: Not on file  Other Topics Concern   Not on file  Social History Narrative   Not on file   Social Drivers of Health   Financial Resource Strain: Low Risk  (02/05/2021)   Overall Financial Resource Strain (CARDIA)    Difficulty of Paying Living Expenses: Not hard at all  Food Insecurity: No Food Insecurity (02/05/2021)   Hunger Vital Sign    Worried About Running Out of Food in the Last Year: Never true    Ran Out of Food in the Last Year: Never true  Transportation Needs: Unmet Transportation Needs (02/05/2021)   PRAPARE - Administrator, Civil Service (Medical): Yes    Lack of Transportation (Non-Medical): No  Physical  Activity: Not on file  Stress: Stress Concern Present (02/05/2021)   [employer] of Occupational Health - Occupational Stress Questionnaire    Feeling of Stress : To some extent  Social Connections: Moderately Isolated (02/05/2021)   Social Connection and Isolation Panel [NHANES]    Frequency of Communication with Friends and Family: More than three times a week    Frequency of Social Gatherings with Friends and Family: More than three times a week    Attends Religious Services: More than 4 times per year    Active Member of [employer] or Organizations: No    Attends Banker Meetings: Never    Marital Status: Widowed  Intimate Partner Violence: Not on file    Family History: No family history on file.  Current Medications:  Current Outpatient Medications:    acetaminophen  (TYLENOL ) 325 MG tablet, Take 3 tablets (975 mg total) by mouth every 6 (six) hours as needed for moderate pain., Disp:  360 tablet, Rfl: 2   atorvastatin (LIPITOR) 40 MG tablet, Take 40 mg by mouth at bedtime., Disp: , Rfl:    azelastine (ASTELIN) 0.1 % nasal spray, Place 1 spray into both nostrils 2 (two) times daily as needed for allergies., Disp: , Rfl:    B-D ULTRAFINE III SHORT PEN 31G X 8 MM MISC, SMARTSIG:1 Each SUB-Q Daily, Disp: , Rfl:    baclofen (LIORESAL) 10 MG tablet, Take 10 mg by mouth 3 (three) times daily as needed for muscle spasms., Disp: , Rfl:    clopidogrel (PLAVIX) 75 MG tablet, Take 75 mg by mouth daily., Disp: , Rfl:    diphenhydrAMINE -zinc acetate (BENADRYL ) cream, Apply 1 application topically 3 (three) times daily as needed for itching., Disp: , Rfl:    docusate sodium  (COLACE) 100 MG capsule, Take 1 capsule (100 mg total) by mouth daily as needed for mild constipation., Disp: 30 capsule, Rfl: 1   ferrous sulfate 325 (65 FE) MG tablet, Take 325 mg by mouth every evening., Disp: , Rfl:    furosemide  (LASIX ) 20 MG tablet, Take 1 tablet (20 mg total) by mouth in the morning., Disp: 30 tablet, Rfl: 2   gabapentin  (NEURONTIN ) 300 MG capsule, Take 1 capsule (300 mg total) by mouth 3 (three) times daily., Disp: 60 capsule, Rfl: 2   lidocaine -prilocaine  (EMLA ) cream, Apply to affected area once, Disp: 30 g, Rfl: 3   losartan (COZAAR) 50 MG tablet, Take 50 mg by mouth at bedtime., Disp: , Rfl:    magnesium  oxide (MAG-OX) 400 (240 Mg) MG tablet, Take 1 tablet (400 mg total) by mouth 2 (two) times daily., Disp: 60 tablet, Rfl: 6   Misc. Devices MISC, Please provide patient with 3 diabetic nutritional supplements per day., Disp: 1 each, Rfl: 11   pantoprazole (PROTONIX) 20 MG tablet, Take 20 mg by mouth daily., Disp: , Rfl:    pioglitazone (ACTOS) 15 MG tablet, Take 15 mg by mouth daily., Disp: , Rfl:    predniSONE  (DELTASONE ) 50 MG tablet, Take 13hrs, 7hrs and 1hr before port placementTake 13hrs, 7hrs and 1hr before port placement, Disp: 3 tablet, Rfl: 0   prochlorperazine  (COMPAZINE ) 10 MG tablet,  Take 1 tablet (10 mg total) by mouth every 6 (six) hours as needed for nausea or vomiting., Disp: 60 tablet, Rfl: 3   SOLIQUA 100-33 UNT-MCG/ML SOPN, Inject 45 Units into the skin daily., Disp: , Rfl:    Tetrahydrozoline HCl (REDNESS RELIEVER EYE DROPS OP), Place 1 drop into both eyes daily as needed (redness).,  Disp: , Rfl:    traMADol  (ULTRAM ) 50 MG tablet, Take 1 tablet (50 mg total) by mouth every 12 (twelve) hours as needed., Disp: 60 tablet, Rfl: 0   Allergies: Allergies  Allergen Reactions   Aspirin Hives   Mometasone Itching   Naproxen Sodium Itching   Penicillin G Swelling   Triamcinolone Itching   Codeine Palpitations   Ibuprofen Rash   Iodinated Contrast Media Itching and Rash    REVIEW OF SYSTEMS:   Review of Systems  Constitutional:  Negative for chills, fatigue and fever.  HENT:   Negative for lump/mass, mouth sores, nosebleeds, sore throat and trouble swallowing.   Eyes:  Negative for eye problems.  Respiratory:  Negative for cough and shortness of breath.   Cardiovascular:  Negative for chest pain, leg swelling and palpitations.  Gastrointestinal:  Negative for abdominal pain, constipation, diarrhea, nausea and vomiting.  Genitourinary:  Negative for bladder incontinence, difficulty urinating, dysuria, frequency, hematuria and nocturia.   Musculoskeletal:  Negative for arthralgias, back pain, flank pain, myalgias and neck pain.  Skin:  Negative for itching and rash.  Neurological:  Negative for dizziness, headaches and numbness.  Hematological:  Does not bruise/bleed easily.  Psychiatric/Behavioral:  Negative for depression, sleep disturbance and suicidal ideas. The patient is not nervous/anxious.   All other systems reviewed and are negative.    VITALS:   There were no vitals taken for this visit.  Wt Readings from Last 3 Encounters:  06/16/23 208 lb 8.9 oz (94.6 kg)  05/19/23 208 lb 15.9 oz (94.8 kg)  04/21/23 207 lb 14.3 oz (94.3 kg)    There is no  height or weight on file to calculate BMI.  Performance status (ECOG): 1 - Symptomatic but completely ambulatory  PHYSICAL EXAM:   Physical Exam Vitals and nursing note reviewed. Exam conducted with a chaperone present.  Constitutional:      Appearance: Normal appearance.  Cardiovascular:     Rate and Rhythm: Normal rate and regular rhythm.     Pulses: Normal pulses.     Heart sounds: Normal heart sounds.  Pulmonary:     Effort: Pulmonary effort is normal.     Breath sounds: Normal breath sounds.  Abdominal:     Palpations: Abdomen is soft. There is no hepatomegaly, splenomegaly or mass.     Tenderness: There is no abdominal tenderness.  Musculoskeletal:     Right lower leg: No edema.     Left lower leg: No edema.  Lymphadenopathy:     Cervical: No cervical adenopathy.     Right cervical: No superficial, deep or posterior cervical adenopathy.    Left cervical: No superficial, deep or posterior cervical adenopathy.     Upper Body:     Right upper body: No supraclavicular or axillary adenopathy.     Left upper body: No supraclavicular or axillary adenopathy.  Neurological:     General: No focal deficit present.     Mental Status: She is alert and oriented to person, place, and time.  Psychiatric:        Mood and Affect: Mood normal.        Behavior: Behavior normal.     LABS:      Latest Ref Rng & Units 06/16/2023    9:10 AM 05/12/2023    1:59 PM 04/21/2023    8:45 AM  CBC  WBC 4.0 - 10.5 K/uL 4.9  7.7  5.1   Hemoglobin 12.0 - 15.0 g/dL 8.2  9.7  9.3  Hematocrit 36.0 - 46.0 % 23.8  28.5  27.2   Platelets 150 - 400 K/uL 208  142  254       Latest Ref Rng & Units 06/16/2023    9:10 AM 05/12/2023    1:59 PM 04/21/2023    8:45 AM  CMP  Glucose 70 - 99 mg/dL 454  098  119   BUN 8 - 23 mg/dL 25  21  20    Creatinine 0.44 - 1.00 mg/dL 1.47  8.29  5.62   Sodium 135 - 145 mmol/L 133  132  138   Potassium 3.5 - 5.1 mmol/L 3.9  4.4  3.7   Chloride 98 - 111 mmol/L 99  100   105   CO2 22 - 32 mmol/L 25  21  21    Calcium 8.9 - 10.3 mg/dL 9.0  9.7  9.3   Total Protein 6.5 - 8.1 g/dL 6.4  7.4  7.2   Total Bilirubin 0.0 - 1.2 mg/dL 0.5  0.6  0.7   Alkaline Phos 38 - 126 U/L 88  93  79   AST 15 - 41 U/L 16  21  16    ALT 0 - 44 U/L 13  18  13       No results found for: "CEA1", "CEA" / No results found for: "CEA1", "CEA" No results found for: "PSA1" No results found for: "CAN199" Lab Results  Component Value Date   CAN125 12.5 06/16/2023    No results found for: "TOTALPROTELP", "ALBUMINELP", "A1GS", "A2GS", "BETS", "BETA2SER", "GAMS", "MSPIKE", "SPEI" Lab Results  Component Value Date   TIBC 343 11/06/2022   TIBC 365 06/24/2022   FERRITIN 301 11/06/2022   FERRITIN 186 06/24/2022   IRONPCTSAT 19 11/06/2022   IRONPCTSAT 19 06/24/2022   Lab Results  Component Value Date   LDH 119 02/11/2021     STUDIES:   No results found.

## 2023-07-14 ENCOUNTER — Inpatient Hospital Stay

## 2023-07-14 ENCOUNTER — Inpatient Hospital Stay: Admitting: Hematology

## 2023-07-14 ENCOUNTER — Inpatient Hospital Stay: Attending: Hematology

## 2023-07-15 ENCOUNTER — Other Ambulatory Visit: Payer: Self-pay

## 2023-07-23 ENCOUNTER — Inpatient Hospital Stay

## 2023-07-23 ENCOUNTER — Inpatient Hospital Stay: Attending: Hematology

## 2023-07-23 ENCOUNTER — Inpatient Hospital Stay (HOSPITAL_BASED_OUTPATIENT_CLINIC_OR_DEPARTMENT_OTHER): Attending: Hematology | Admitting: Hematology

## 2023-07-23 VITALS — BP 135/87 | HR 96 | Temp 98.1°F | Resp 20 | Wt 200.0 lb

## 2023-07-23 VITALS — BP 153/74 | HR 95 | Temp 98.1°F | Resp 20

## 2023-07-23 DIAGNOSIS — Z7984 Long term (current) use of oral hypoglycemic drugs: Secondary | ICD-10-CM | POA: Diagnosis not present

## 2023-07-23 DIAGNOSIS — Z79899 Other long term (current) drug therapy: Secondary | ICD-10-CM | POA: Insufficient documentation

## 2023-07-23 DIAGNOSIS — Z5111 Encounter for antineoplastic chemotherapy: Secondary | ICD-10-CM | POA: Insufficient documentation

## 2023-07-23 DIAGNOSIS — I10 Essential (primary) hypertension: Secondary | ICD-10-CM | POA: Insufficient documentation

## 2023-07-23 DIAGNOSIS — Z5112 Encounter for antineoplastic immunotherapy: Secondary | ICD-10-CM | POA: Diagnosis present

## 2023-07-23 DIAGNOSIS — C562 Malignant neoplasm of left ovary: Secondary | ICD-10-CM

## 2023-07-23 DIAGNOSIS — E1142 Type 2 diabetes mellitus with diabetic polyneuropathy: Secondary | ICD-10-CM | POA: Insufficient documentation

## 2023-07-23 LAB — CBC WITH DIFFERENTIAL/PLATELET
Abs Immature Granulocytes: 0.02 10*3/uL (ref 0.00–0.07)
Basophils Absolute: 0.1 10*3/uL (ref 0.0–0.1)
Basophils Relative: 1 %
Eosinophils Absolute: 0.2 10*3/uL (ref 0.0–0.5)
Eosinophils Relative: 2 %
HCT: 27.1 % — ABNORMAL LOW (ref 36.0–46.0)
Hemoglobin: 8.9 g/dL — ABNORMAL LOW (ref 12.0–15.0)
Immature Granulocytes: 0 %
Lymphocytes Relative: 35 %
Lymphs Abs: 2.9 10*3/uL (ref 0.7–4.0)
MCH: 34.8 pg — ABNORMAL HIGH (ref 26.0–34.0)
MCHC: 32.8 g/dL (ref 30.0–36.0)
MCV: 105.9 fL — ABNORMAL HIGH (ref 80.0–100.0)
Monocytes Absolute: 1 10*3/uL (ref 0.1–1.0)
Monocytes Relative: 12 %
Neutro Abs: 4.2 10*3/uL (ref 1.7–7.7)
Neutrophils Relative %: 50 %
Platelets: 309 10*3/uL (ref 150–400)
RBC: 2.56 MIL/uL — ABNORMAL LOW (ref 3.87–5.11)
RDW: 15.8 % — ABNORMAL HIGH (ref 11.5–15.5)
WBC: 8.4 10*3/uL (ref 4.0–10.5)
nRBC: 0 % (ref 0.0–0.2)

## 2023-07-23 LAB — MAGNESIUM: Magnesium: 2 mg/dL (ref 1.7–2.4)

## 2023-07-23 MED ORDER — HEPARIN SOD (PORK) LOCK FLUSH 100 UNIT/ML IV SOLN
500.0000 [IU] | Freq: Once | INTRAVENOUS | Status: AC | PRN
  Administered 2023-07-23: 500 [IU]

## 2023-07-23 MED ORDER — BEVACIZUMAB-ADCD CHEMO INJECTION 400 MG/16ML
15.0000 mg/kg | Freq: Once | INTRAVENOUS | Status: AC
  Administered 2023-07-23: 1500 mg via INTRAVENOUS
  Filled 2023-07-23: qty 48

## 2023-07-23 MED ORDER — SODIUM CHLORIDE 0.9 % IV SOLN: INTRAVENOUS | Status: DC

## 2023-07-23 MED ORDER — SODIUM CHLORIDE 0.9% FLUSH
10.0000 mL | INTRAVENOUS | Status: DC | PRN
Start: 2023-07-23 — End: 2023-07-23
  Administered 2023-07-23: 10 mL

## 2023-07-23 NOTE — Progress Notes (Signed)
 Patient okay for treatment today per Dr. [name].

## 2023-07-23 NOTE — Patient Instructions (Addendum)
 [facility] - A DEPT OF [facility]  Discharge Instructions: Thank you for choosing [facility] to provide your oncology and hematology care.  If you have a lab appointment with the Cancer Center - please note that after April 8th, 2024, all labs will be drawn in the cancer center.  You do not have to check in or register with the main entrance as you have in the past but will complete your check-in in the cancer center.  Wear comfortable clothing and clothing appropriate for easy access to any Portacath or PICC line.   We strive to give you quality time with your provider. You may need to reschedule your appointment if you arrive late (15 or more minutes).  Arriving late affects you and other patients whose appointments are after yours.  Also, if you miss three or more appointments without notifying the office, you may be dismissed from the clinic at the provider's discretion.      For prescription refill requests, have your pharmacy contact our office and allow 72 hours for refills to be completed.    Today you received the following chemotherapy and/or immunotherapy agents Vegzelma.  Bevacizumab Injection What is this medication? BEVACIZUMAB (be va SIZ yoo mab) treats some types of cancer. It works by blocking a protein that causes cancer cells to grow and multiply. This helps to slow or stop the spread of cancer cells. It is a monoclonal antibody. This medicine may be used for other purposes; ask your health care provider or pharmacist if you have questions. COMMON BRAND NAME(S): Alymsys, Avastin, MVASI, Ros[place] Lao What should I tell my care team before I take this medication? They need to know if you have any of these conditions: Blood clots Coughing up blood Having or recent surgery Heart failure High blood pressure History of a connection between 2 or more body parts that do not usually connect (fistula) History of a tear in your  stomach or intestines Protein in your urine An unusual or allergic reaction to bevacizumab, other medications, foods, dyes, or preservatives Pregnant or trying to get pregnant Breast-feeding How should I use this medication? This medication is injected into a vein. It is given by your care team in a hospital or clinic setting. Talk to your care team the use of this medication in children. Special care may be needed. Overdosage: If you think you have taken too much of this medicine contact a poison control center or emergency room at once. NOTE: This medicine is only for you. Do not share this medicine with others. What if I miss a dose? Keep appointments for follow-up doses. It is important not to miss your dose. Call your care team if you are unable to keep an appointment. What may interact with this medication? Interactions are not expected. This list may not describe all possible interactions. Give your health care provider a list of all the medicines, herbs, non-prescription drugs, or dietary supplements you use. Also tell them if you smoke, drink alcohol, or use illegal drugs. Some items may interact with your medicine. What should I watch for while using this medication? Your condition will be monitored carefully while you are receiving this medication. You may need blood work while taking this medication. This medication may make you feel generally unwell. This is not uncommon as chemotherapy can affect healthy cells as well as cancer cells. Report any side effects. Continue your course of treatment even though you feel  ill unless your care team tells you to stop. This medication may increase your risk to bruise or bleed. Call your care team if you notice any unusual bleeding. Before having surgery, talk to your care team to make sure it is ok. This medication can increase the risk of poor healing of your surgical site or wound. You will need to stop this medication for 28 days before  surgery. After surgery, wait at least 28 days before restarting this medication. Make sure the surgical site or wound is healed enough before restarting this medication. Talk to your care team if questions. Talk to your care team if you may be pregnant. Serious birth defects can occur if you take this medication during pregnancy and for 6 months after the last dose. Contraception is recommended while taking this medication and for 6 months after the last dose. Your care team can help you find the option that works for you. Do not breastfeed while taking this medication and for 6 months after the last dose. This medication can cause infertility. Talk to your care team if you are concerned about your fertility. What side effects may I notice from receiving this medication? Side effects that you should report to your care team as soon as possible: Allergic reactions--skin rash, itching, hives, swelling of the face, lips, tongue, or throat Bleeding--bloody or black, tar-like stools, vomiting blood or Rebecca [name] material that looks like coffee grounds, red or dark Sylvain Hasten urine, small red or purple spots on skin, unusual bruising or bleeding Blood clot--pain, swelling, or warmth in the leg, shortness of breath, chest pain Heart attack--pain or tightness in the chest, shoulders, arms, or jaw, nausea, shortness of breath, cold or clammy skin, feeling faint or lightheaded Heart failure--shortness of breath, swelling of the ankles, feet, or hands, sudden weight gain, unusual weakness or fatigue Increase in blood pressure Infection--fever, chills, cough, sore throat, wounds that don't heal, pain or trouble when passing urine, general feeling of discomfort or being unwell Infusion reactions--chest pain, shortness of breath or trouble breathing, feeling faint or lightheaded Kidney injury--decrease in the amount of urine, swelling of the ankles, hands, or feet Stomach pain that is severe, does not go away, or gets  worse Stroke--sudden numbness or weakness of the face, arm, or leg, trouble speaking, confusion, trouble walking, loss of balance or coordination, dizziness, severe headache, change in vision Sudden and severe headache, confusion, change in vision, seizures, which may be signs of posterior reversible encephalopathy syndrome (PRES) Side effects that usually do not require medical attention (report to your care team if they continue or are bothersome): Back pain Change in taste Diarrhea Dry skin Increased tears Nosebleed This list may not describe all possible side effects. Call your doctor for medical advice about side effects. You may report side effects to FDA at 1-800-FDA-1088. Where should I keep my medication? This medication is given in a hospital or clinic. It will not be stored at home. NOTE: This sheet is a summary. It may not cover all possible information. If you have questions about this medicine, talk to your doctor, pharmacist, or health care provider.  2024 Elsevier/Gold Standard (2021-07-05 00:00:00)       To help prevent nausea and vomiting after your treatment, we encourage you to take your nausea medication as directed.  BELOW ARE SYMPTOMS THAT SHOULD BE REPORTED IMMEDIATELY: *FEVER GREATER THAN 100.4 F (38 C) OR HIGHER *CHILLS OR SWEATING *NAUSEA AND VOMITING THAT IS NOT CONTROLLED WITH YOUR NAUSEA MEDICATION *UNUSUAL SHORTNESS  OF BREATH *UNUSUAL BRUISING OR BLEEDING *URINARY PROBLEMS (pain or burning when urinating, or frequent urination) *BOWEL PROBLEMS (unusual diarrhea, constipation, pain near the anus) TENDERNESS IN MOUTH AND THROAT WITH OR WITHOUT PRESENCE OF ULCERS (sore throat, sores in mouth, or a toothache) UNUSUAL RASH, SWELLING OR PAIN  UNUSUAL VAGINAL DISCHARGE OR ITCHING   Items with * indicate a potential emergency and should be followed up as soon as possible or go to the Emergency Department if any problems should occur.  Please show the  CHEMOTHERAPY ALERT CARD or IMMUNOTHERAPY ALERT CARD at check-in to the Emergency Department and triage nurse.  Should you have questions after your visit or need to cancel or reschedule your appointment, please contact [facility] [place] - A DEPT OF [facility]igh[facility] [phone]  and follow the prompts.  Office hours are 8:00 a.m. to 4:30 p.m. Monday - Friday. Please note that voicemails left after 4:00 p.m. may not be returned until the following business day.  We are closed weekends and major holidays. You have access to a nurse at all times for urgent questions. Please call the main number to the clinic [phone] and follow the prompts.  For any non-urgent questions, you may also contact your provider using MyChart. We now offer e-Visits for anyone 92 and older to request care online for non-urgent symptoms. For details visit [email or web address].   Also download the MyChart app! Go to the app store, search "MyChart", open the app, select , and log in with your MyChart username and password.

## 2023-07-23 NOTE — Progress Notes (Signed)
Patient presents today for chemotherapy infusion. Patient is in satisfactory condition with no new complaints voiced.  Vital signs are stable.  Labs reviewed by Dr. [name] during the office visit and all labs are within treatment parameters.  We will proceed with treatment per MD orders.   Patient tolerated treatment well with no complaints voiced.  Patient left via wheelchair in stable condition.  Vital signs stable at discharge.  Follow up as scheduled.    

## 2023-07-23 NOTE — Progress Notes (Signed)
 [facility] [address] [place] [address]    Clinic Day:  07/23/2023  Referring physician: Alphonso Aschoff, MD  Patient Care Team: Alphonso Aschoff, MD as PCP - General (Gynecologic Oncology) Paulett Boros, MD as Medical Oncologist (Medical Oncology) Gerhard Knuckles, RN as Oncology Nurse Navigator (Oncology) [name], Dorie Garfinkel, NP as Nurse Practitioner (Cardiothoracic Surgery)   ASSESSMENT & PLAN:   Assessment: 1. Stage IIIb high-grade serous ovarian carcinoma: - Presentation with abdominal distention and bloating sensation. - CT abdomen at outside hospital on 12/05/2020 with findings concerning for ovarian neoplasm. - CT chest without contrast on 12/18/2020 with scattered solid pulmonary nodules measuring up to 3 mm, indeterminate. - CA125 on 12/05/2020-10000 - 12/18/2020 exploratory laparotomy, bilateral salpingo-oophorectomy, infra gastric omentectomy, R0 primary tumor debulking by Dr. Pearly Bound at [facility] - Pathology-left ovary high-grade serous carcinoma, greatest dimension 26.5 cm, left ovarian capsule intact.  Fallopian tube surface involvement present, omentum involvement present, largest extrapelvic peritoneal focus-macroscopic, peritoneal/ascitic fluid involvement negative.  [id number].  FIGO stage IIIb. - Rebecca [name] was evaluated for FLORA 5 clinical trial, found not to be a candidate. - Germline mutation testing was negative. - 6 cycles of carboplatin  and paclitaxel  from 02/11/2021 through 05/28/2021.  Rebecca [name] has refused niraparib  maintenance. - CTAP on 11/10/2022 showed recurrence in the left pelvic sidewall. - Rebecca [name] met with Dr. Pearly Bound and felt not a candidate for debulking.  Rebecca [name] is considered platinum sensitive. - Carboplatin , Doxil  started on 01/12/2023, cycle 6 on 06/16/2023, maintenance bevacizumab  started on 07/23/2023.    2. Social/family history: - Rebecca [name] is seen with Rebecca [name] daughter-in-law today.  Rebecca [name] lives by herself.  Rebecca [name] ambulates with the help of cane.  Rebecca [name]  retired after working at [employer].  Rebecca [name] is non-smoker. - Brother had prostate cancer.  Another brother had throat cancer.  Sister had kidney cancer.  Grandson had bladder cancer at age 28.  Paternal uncle had head and neck cancer.    Plan: 1. Stage IIIb high-grade serous ovarian carcinoma: - CTAP (05/11/2020): Diminished size of soft tissue nodule at the left vaginal cuff measuring 1.8 x 1.5 cm, previously 3.3 x 2.4 cm.  Unchanged small nodule just anteriorly measuring 0.5 cm.  Unchanged 0.6 cm peritoneal nodule in the left upper quadrant adjacent to the splenic flexure.  No new evidence of adenopathy or metastatic disease. -Rebecca [name] has completed 6 cycles of Doxil  and bevacizumab .  Rebecca [name] reportedly fell few weeks ago, had foot injury and is healing well. - We talked about switching Rebecca [name] to maintenance bevacizumab  at this time every 3 weeks.  Will proceed with Rebecca [name] first maintenance bevacizumab  today.  RTC 6 weeks for follow-up with CTAP with contrast.   2.  Hypomagnesemia: - Continue magnesium  twice daily.  Magnesium  is normal today.   3.  Peripheral neuropathy: - Numbness in the feet and fingers tips has been stable.  4.  High risk drug monitoring: -Denies any PND or orthopnea.  Last 2D echo on 01/07/2023 with LVEF 65 to 70%.    Orders Placed This Encounter  Procedures   CT ABDOMEN PELVIS W CONTRAST    Standing Status:   Future    Expected Date:   08/23/2023    Expiration Date:   07/22/2024    If indicated for the ordered procedure, I authorize the administration of contrast media per Radiology protocol:   Yes    Does the patient have a contrast media/X-ray dye allergy?:   No    Preferred imaging location?:  [facility]    If indicated for the ordered procedure, I authorize the administration of oral contrast media per Radiology protocol:   Yes   Ferritin    Standing Status:   Future    Expected Date:   08/04/2023    Expiration Date:   08/03/2024   Ferritin    Standing Status:    Future    Expected Date:   08/25/2023    Expiration Date:   08/24/2024   Ferritin    Standing Status:   Future    Expected Date:   09/15/2023    Expiration Date:   09/14/2024   Ferritin    Standing Status:   Future    Expected Date:   10/06/2023    Expiration Date:   10/05/2024   Ferritin    Standing Status:   Future    Expected Date:   10/27/2023    Expiration Date:   10/26/2024      Hurman Maiden R Teague,acting as a scribe for Paulett Boros, MD.,have documented all relevant documentation on the behalf of Paulett Boros, MD,as directed by  Paulett Boros, MD while in the presence of Paulett Boros, MD.  I, Paulett Boros MD, have reviewed the above documentation for accuracy and completeness, and I agree with the above.     Paulett Boros, MD   5/22/20252:35 PM  CHIEF COMPLAINT:   Diagnosis: ovarian cancer    Cancer Staging  Ovarian cancer on left [facility]) Staging form: Ovary, Fallopian Tube, and Primary Peritoneal Carcinoma, AJCC 8th Edition - Clinical stage from 01/30/2021: FIGO Stage IIIB (cT3b, cNX, cM0) - Signed by Paulett Boros, MD on 01/30/2021    Prior Therapy: 1. Exploratory laparotomy, BSO, and infra-gastric omentectomy on 12/19/2020 with Dr. Pearly Bound  2. Carboplatin  (AUC 6) / Paclitaxel  (175) q21d x 6 cycles 02/11/2021 - 05/28/2021   Current Therapy:  carboplatin  and Doxil  with bevacizumab     HISTORY OF PRESENT ILLNESS:   Oncology History  Ovarian cancer on left [facility])  01/07/2021 Initial Diagnosis   Ovarian cancer on left [facility])   01/30/2021 Cancer Staging   Staging form: Ovary, Fallopian Tube, and Primary Peritoneal Carcinoma, AJCC 8th Edition - Clinical stage from 01/30/2021: FIGO Stage IIIB (cT3b, cNX, cM0) - Signed by Paulett Boros, MD on 01/30/2021 Histologic grade (G): G3 Histologic grading system: 4 grade system   02/11/2021 - 05/28/2021 Chemotherapy   Patient is on Treatment Plan : OVARIAN Carboplatin  (AUC 6) /  Paclitaxel  (175) q21d x 6 cycles     01/12/2023 -  Chemotherapy   Patient is on Treatment Plan : OVARIAN Liposomal Doxorubicin  D1 + Carboplatin  D1 + Bevacizumab  D1,15 q28d / Bevacizumab  q21d        INTERVAL HISTORY:   Rebecca [name] is a 78 y.o. female presenting to clinic today for follow up of ovarian cancer. Rebecca [name] was last seen by me on 06/16/23.  Today, Rebecca [name] states that Rebecca [name] is doing well overall. Rebecca [name] appetite level is at 100%. Rebecca [name] energy level is at 50%.  PAST MEDICAL HISTORY:   Past Medical History: Past Medical History:  Diagnosis Date   Anemia    CAD (coronary artery disease)    DM2 (diabetes mellitus, type 2) (HCC)    GERD (gastroesophageal reflux disease)    HTN (hypertension)    Ovarian ca (HCC)    Port-A-Cath in place 02/02/2021    Surgical History: Past Surgical History:  Procedure Laterality Date   IR IMAGING GUIDED PORT INSERTION  02/08/2021    Social History: Social  History   Socioeconomic History   Marital status: Single    Spouse name: Not on file   Number of children: Not on file   Years of education: Not on file   Highest education level: Not on file  Occupational History   Not on file  Tobacco Use   Smoking status: Not on file   Smokeless tobacco: Not on file  Substance and Sexual Activity   Alcohol use: Not on file   Drug use: Not on file   Sexual activity: Not on file  Other Topics Concern   Not on file  Social History Narrative   Not on file   Social Drivers of Health   Financial Resource Strain: Low Risk  (02/05/2021)   Overall Financial Resource Strain (CARDIA)    Difficulty of Paying Living Expenses: Not hard at all  Food Insecurity: No Food Insecurity (02/05/2021)   Hunger Vital Sign    Worried About Running Out of Food in the Last Year: Never true    Ran Out of Food in the Last Year: Never true  Transportation Needs: Unmet Transportation Needs (02/05/2021)   PRAPARE - Administrator, Civil Service (Medical): Yes    Lack of  Transportation (Non-Medical): No  Physical Activity: Not on file  Stress: Stress Concern Present (02/05/2021)   [employer] of Occupational Health - Occupational Stress Questionnaire    Feeling of Stress : To some extent  Social Connections: Moderately Isolated (02/05/2021)   Social Connection and Isolation Panel [NHANES]    Frequency of Communication with Friends and Family: More than three times a week    Frequency of Social Gatherings with Friends and Family: More than three times a week    Attends Religious Services: More than 4 times per year    Active Member of [employer] or Organizations: No    Attends Banker Meetings: Never    Marital Status: Widowed  Intimate Partner Violence: Not on file    Family History: No family history on file.  Current Medications:  Current Outpatient Medications:    acetaminophen  (TYLENOL ) 325 MG tablet, Take 3 tablets (975 mg total) by mouth every 6 (six) hours as needed for moderate pain., Disp: 360 tablet, Rfl: 2   atorvastatin (LIPITOR) 40 MG tablet, Take 40 mg by mouth at bedtime., Disp: , Rfl:    azelastine (ASTELIN) 0.1 % nasal spray, Place 1 spray into both nostrils 2 (two) times daily as needed for allergies., Disp: , Rfl:    B-D ULTRAFINE III SHORT PEN 31G X 8 MM MISC, SMARTSIG:1 Each SUB-Q Daily, Disp: , Rfl:    baclofen (LIORESAL) 10 MG tablet, Take 10 mg by mouth 3 (three) times daily as needed for muscle spasms., Disp: , Rfl:    clopidogrel (PLAVIX) 75 MG tablet, Take 75 mg by mouth daily., Disp: , Rfl:    diphenhydrAMINE -zinc acetate (BENADRYL ) cream, Apply 1 application topically 3 (three) times daily as needed for itching., Disp: , Rfl:    docusate sodium  (COLACE) 100 MG capsule, Take 1 capsule (100 mg total) by mouth daily as needed for mild constipation., Disp: 30 capsule, Rfl: 1   ferrous sulfate 325 (65 FE) MG tablet, Take 325 mg by mouth every evening., Disp: , Rfl:    furosemide  (LASIX ) 20 MG tablet, Take 1 tablet  (20 mg total) by mouth in the morning., Disp: 30 tablet, Rfl: 2   gabapentin  (NEURONTIN ) 300 MG capsule, Take 1 capsule (300 mg total) by mouth 3 (  three) times daily., Disp: 60 capsule, Rfl: 2   lidocaine -prilocaine  (EMLA ) cream, Apply to affected area once, Disp: 30 g, Rfl: 3   losartan (COZAAR) 50 MG tablet, Take 50 mg by mouth at bedtime., Disp: , Rfl:    magnesium  oxide (MAG-OX) 400 (240 Mg) MG tablet, Take 1 tablet (400 mg total) by mouth 2 (two) times daily., Disp: 60 tablet, Rfl: 6   Misc. Devices MISC, Please provide patient with 3 diabetic nutritional supplements per day., Disp: 1 each, Rfl: 11   nitrofurantoin , macrocrystal-monohydrate, (MACROBID ) 100 MG capsule, TAKE 1 CAPSULE BY MOUTH EVERY 12 HOURS URINARY TRACT INFECTION FOR 7 DAYS, Disp: , Rfl:    pantoprazole (PROTONIX) 20 MG tablet, Take 20 mg by mouth daily., Disp: , Rfl:    pioglitazone (ACTOS) 15 MG tablet, Take 15 mg by mouth daily., Disp: , Rfl:    predniSONE  (DELTASONE ) 50 MG tablet, Take 13hrs, 7hrs and 1hr before port placementTake 13hrs, 7hrs and 1hr before port placement, Disp: 3 tablet, Rfl: 0   prochlorperazine  (COMPAZINE ) 10 MG tablet, Take 1 tablet (10 mg total) by mouth every 6 (six) hours as needed for nausea or vomiting., Disp: 60 tablet, Rfl: 3   SOLIQUA 100-33 UNT-MCG/ML SOPN, Inject 45 Units into the skin daily., Disp: , Rfl:    Tetrahydrozoline HCl (REDNESS RELIEVER EYE DROPS OP), Place 1 drop into both eyes daily as needed (redness)., Disp: , Rfl:    traMADol  (ULTRAM ) 50 MG tablet, Take 1 tablet (50 mg total) by mouth every 12 (twelve) hours as needed., Disp: 60 tablet, Rfl: 0 No current facility-administered medications for this visit.  Facility-Administered Medications Ordered in Other Visits:    0.9 %  sodium chloride  infusion, , Intravenous, Continuous, Esmeralda Malay, MD   bevacizumab -adcd (VEGZELMA ) 1,500 mg in sodium chloride  0.9 % 100 mL chemo infusion, 15 mg/kg (Order-Specific), Intravenous,  Once, Paulett Boros, MD   heparin  lock flush 100 unit/mL, 500 Units, Intracatheter, Once PRN, Flonnie Wierman, MD   sodium chloride  flush (NS) 0.9 % injection 10 mL, 10 mL, Intracatheter, PRN, Joandry Slagter, MD   Allergies: Allergies  Allergen Reactions   Aspirin Hives   Mometasone Itching   Naproxen Sodium Itching   Penicillin G Swelling   Triamcinolone Itching   Codeine Palpitations   Ibuprofen Rash   Iodinated Contrast Media Itching and Rash    REVIEW OF SYSTEMS:   Review of Systems  Constitutional:  Positive for fatigue. Negative for chills and fever.  HENT:   Negative for lump/mass, mouth sores, nosebleeds, sore throat and trouble swallowing.   Eyes:  Negative for eye problems.  Respiratory:  Positive for cough and shortness of breath.   Cardiovascular:  Negative for chest pain, leg swelling and palpitations.  Gastrointestinal:  Negative for abdominal pain, constipation, diarrhea, nausea and vomiting.  Genitourinary:  Negative for bladder incontinence, difficulty urinating, dysuria, frequency, hematuria and nocturia.   Musculoskeletal:  Negative for arthralgias, back pain, flank pain, myalgias and neck pain.  Skin:  Negative for itching and rash.  Neurological:  Positive for dizziness, headaches and numbness.  Hematological:  Does not bruise/bleed easily.  Psychiatric/Behavioral:  Positive for sleep disturbance. Negative for depression and suicidal ideas. The patient is not nervous/anxious.   All other systems reviewed and are negative.    VITALS:   Blood pressure 135/87, pulse 96, temperature 98.1 F (36.7 C), temperature source Tympanic, resp. rate 20, weight 199 lb 15.3 oz (90.7 kg), SpO2 98%.  Wt Readings from Last 3 Encounters:  07/23/23 199 lb 15.3 oz (90.7 kg)  06/16/23 208 lb 8.9 oz (94.6 kg)  05/19/23 208 lb 15.9 oz (94.8 kg)    Body mass index is 35.42 kg/m.  Performance status (ECOG): 1 - Symptomatic but completely  ambulatory  PHYSICAL EXAM:   Physical Exam Vitals and nursing note reviewed. Exam conducted with a chaperone present.  Constitutional:      Appearance: Normal appearance.  Cardiovascular:     Rate and Rhythm: Normal rate and regular rhythm.     Pulses: Normal pulses.     Heart sounds: Normal heart sounds.  Pulmonary:     Effort: Pulmonary effort is normal.     Breath sounds: Normal breath sounds.  Abdominal:     Palpations: Abdomen is soft. There is no hepatomegaly, splenomegaly or mass.     Tenderness: There is no abdominal tenderness.  Musculoskeletal:     Right lower leg: No edema.     Left lower leg: No edema.  Lymphadenopathy:     Cervical: No cervical adenopathy.     Right cervical: No superficial, deep or posterior cervical adenopathy.    Left cervical: No superficial, deep or posterior cervical adenopathy.     Upper Body:     Right upper body: No supraclavicular or axillary adenopathy.     Left upper body: No supraclavicular or axillary adenopathy.  Neurological:     General: No focal deficit present.     Mental Status: Rebecca [name] is alert and oriented to person, place, and time.  Psychiatric:        Mood and Affect: Mood normal.        Behavior: Behavior normal.     LABS:      Latest Ref Rng & Units 07/23/2023   12:27 PM 06/16/2023    9:10 AM 05/12/2023    1:59 PM  CBC  WBC 4.0 - 10.5 K/uL 8.4  4.9  7.7   Hemoglobin 12.0 - 15.0 g/dL 8.9  8.2  9.7   Hematocrit 36.0 - 46.0 % 27.1  23.8  28.5   Platelets 150 - 400 K/uL 309  208  142       Latest Ref Rng & Units 06/16/2023    9:10 AM 05/12/2023    1:59 PM 04/21/2023    8:45 AM  CMP  Glucose 70 - 99 mg/dL 409  811  914   BUN 8 - 23 mg/dL 25  21  20    Creatinine 0.44 - 1.00 mg/dL 7.82  9.56  2.13   Sodium 135 - 145 mmol/L 133  132  138   Potassium 3.5 - 5.1 mmol/L 3.9  4.4  3.7   Chloride 98 - 111 mmol/L 99  100  105   CO2 22 - 32 mmol/L 25  21  21    Calcium 8.9 - 10.3 mg/dL 9.0  9.7  9.3   Total Protein 6.5 -  8.1 g/dL 6.4  7.4  7.2   Total Bilirubin 0.0 - 1.2 mg/dL 0.5  0.6  0.7   Alkaline Phos 38 - 126 U/L 88  93  79   AST 15 - 41 U/L 16  21  16    ALT 0 - 44 U/L 13  18  13       No results found for: "CEA1", "CEA" / No results found for: "CEA1", "CEA" No results found for: "PSA1" No results found for: "YQM578" Lab Results  Component Value Date   CAN125 12.5 06/16/2023    No results found  for: "TOTALPROTELP", "ALBUMINELP", "A1GS", "A2GS", "BETS", "BETA2SER", "GAMS", "MSPIKE", "SPEI" Lab Results  Component Value Date   TIBC 343 11/06/2022   TIBC 365 06/24/2022   FERRITIN 301 11/06/2022   FERRITIN 186 06/24/2022   IRONPCTSAT 19 11/06/2022   IRONPCTSAT 19 06/24/2022   Lab Results  Component Value Date   LDH 119 02/11/2021     STUDIES:   No results found.

## 2023-07-23 NOTE — Patient Instructions (Signed)

## 2023-07-23 NOTE — Progress Notes (Signed)
 Patient has been examined by Dr. Ellin Saba. Vital signs and labs have been reviewed by MD - ANC, Creatinine, LFTs, hemoglobin, and platelets are within treatment parameters per M.D. - pt may proceed with treatment.  Primary RN and pharmacy notified.

## 2023-07-24 ENCOUNTER — Other Ambulatory Visit: Payer: Self-pay

## 2023-07-24 LAB — CA 125: Cancer Antigen (CA) 125: 15.6 U/mL (ref 0.0–38.1)

## 2023-07-26 ENCOUNTER — Other Ambulatory Visit: Payer: Self-pay

## 2023-08-11 ENCOUNTER — Inpatient Hospital Stay

## 2023-08-11 ENCOUNTER — Inpatient Hospital Stay: Admitting: Hematology

## 2023-08-13 ENCOUNTER — Inpatient Hospital Stay

## 2023-08-13 ENCOUNTER — Inpatient Hospital Stay: Admitting: Hematology

## 2023-08-18 ENCOUNTER — Inpatient Hospital Stay

## 2023-08-18 ENCOUNTER — Inpatient Hospital Stay: Attending: Hematology

## 2023-08-18 VITALS — BP 156/75 | HR 93 | Temp 97.7°F | Resp 18 | Wt 195.6 lb

## 2023-08-18 VITALS — BP 130/62 | HR 110 | Temp 97.8°F | Resp 21

## 2023-08-18 DIAGNOSIS — Z5111 Encounter for antineoplastic chemotherapy: Secondary | ICD-10-CM | POA: Diagnosis present

## 2023-08-18 DIAGNOSIS — C562 Malignant neoplasm of left ovary: Secondary | ICD-10-CM

## 2023-08-18 DIAGNOSIS — Z95828 Presence of other vascular implants and grafts: Secondary | ICD-10-CM

## 2023-08-18 DIAGNOSIS — Z5112 Encounter for antineoplastic immunotherapy: Secondary | ICD-10-CM | POA: Diagnosis present

## 2023-08-18 DIAGNOSIS — Z79899 Other long term (current) drug therapy: Secondary | ICD-10-CM | POA: Insufficient documentation

## 2023-08-18 LAB — COMPREHENSIVE METABOLIC PANEL WITH GFR
ALT: 14 U/L (ref 0–44)
AST: 16 U/L (ref 15–41)
Albumin: 3.7 g/dL (ref 3.5–5.0)
Alkaline Phosphatase: 113 U/L (ref 38–126)
Anion gap: 10 (ref 5–15)
BUN: 16 mg/dL (ref 8–23)
CO2: 24 mmol/L (ref 22–32)
Calcium: 9.5 mg/dL (ref 8.9–10.3)
Chloride: 102 mmol/L (ref 98–111)
Creatinine, Ser: 1.33 mg/dL — ABNORMAL HIGH (ref 0.44–1.00)
GFR, Estimated: 41 mL/min — ABNORMAL LOW (ref >60)
Glucose, Bld: 109 mg/dL — ABNORMAL HIGH (ref 70–99)
Potassium: 4.4 mmol/L (ref 3.5–5.1)
Sodium: 136 mmol/L (ref 135–145)
Total Bilirubin: 0.8 mg/dL (ref 0.0–1.2)
Total Protein: 7.7 g/dL (ref 6.5–8.1)

## 2023-08-18 LAB — CBC WITH DIFFERENTIAL/PLATELET
Abs Immature Granulocytes: 0.04 10*3/uL (ref 0.00–0.07)
Basophils Absolute: 0 10*3/uL (ref 0.0–0.1)
Basophils Relative: 1 %
Eosinophils Absolute: 0.1 10*3/uL (ref 0.0–0.5)
Eosinophils Relative: 2 %
HCT: 28.5 % — ABNORMAL LOW (ref 36.0–46.0)
Hemoglobin: 9.2 g/dL — ABNORMAL LOW (ref 12.0–15.0)
Immature Granulocytes: 1 %
Lymphocytes Relative: 14 %
Lymphs Abs: 1.2 10*3/uL (ref 0.7–4.0)
MCH: 32.5 pg (ref 26.0–34.0)
MCHC: 32.3 g/dL (ref 30.0–36.0)
MCV: 100.7 fL — ABNORMAL HIGH (ref 80.0–100.0)
Monocytes Absolute: 0.6 10*3/uL (ref 0.1–1.0)
Monocytes Relative: 7 %
Neutro Abs: 6.5 10*3/uL (ref 1.7–7.7)
Neutrophils Relative %: 75 %
Platelets: 282 10*3/uL (ref 150–400)
RBC: 2.83 MIL/uL — ABNORMAL LOW (ref 3.87–5.11)
RDW: 14.7 % (ref 11.5–15.5)
WBC: 8.5 10*3/uL (ref 4.0–10.5)
nRBC: 0 % (ref 0.0–0.2)

## 2023-08-18 LAB — MAGNESIUM: Magnesium: 1.8 mg/dL (ref 1.7–2.4)

## 2023-08-18 MED ORDER — SODIUM CHLORIDE 0.9 % IV SOLN: INTRAVENOUS | Status: DC

## 2023-08-18 MED ORDER — SODIUM CHLORIDE 0.9% FLUSH
10.0000 mL | INTRAVENOUS | Status: DC | PRN
  Administered 2023-08-18: 10 mL

## 2023-08-18 MED ORDER — SODIUM CHLORIDE 0.9 % IV SOLN
1300.0000 mg | Freq: Once | INTRAVENOUS | Status: AC
  Administered 2023-08-18: 1300 mg via INTRAVENOUS
  Filled 2023-08-18: qty 48

## 2023-08-18 MED ORDER — PROCHLORPERAZINE MALEATE 10 MG PO TABS
10.0000 mg | ORAL_TABLET | Freq: Once | ORAL | Status: AC
  Administered 2023-08-18: 10 mg via ORAL
  Filled 2023-08-18: qty 1

## 2023-08-18 MED ORDER — HEPARIN SOD (PORK) LOCK FLUSH 100 UNIT/ML IV SOLN
500.0000 [IU] | Freq: Once | INTRAVENOUS | Status: AC | PRN
  Administered 2023-08-18: 500 [IU]

## 2023-08-18 MED ORDER — SODIUM CHLORIDE 0.9% FLUSH
10.0000 mL | INTRAVENOUS | Status: DC | PRN
  Administered 2023-08-18: 10 mL via INTRAVENOUS

## 2023-08-18 MED ORDER — ACETAMINOPHEN 325 MG PO TABS
650.0000 mg | ORAL_TABLET | Freq: Once | ORAL | Status: AC
  Administered 2023-08-18: 650 mg via ORAL
  Filled 2023-08-18: qty 2

## 2023-08-18 NOTE — Progress Notes (Signed)
 Vegzelma  dose adjustment with new weight 88.7 kg  Vegzelma  15 mg/kg = 1300 mg  V.O. Dr Davina Ester, PharmD

## 2023-08-18 NOTE — Progress Notes (Signed)
 Patients port flushed without difficulty.  Good blood return noted with no bruising or swelling noted at site.  Patient remains accessed for treatment.

## 2023-08-18 NOTE — Progress Notes (Signed)
 Patient presents today for Vegzelma  infusion per providers order.  Vital signs and labs within parameters for treatment.  Patient has no new complaints at this time.  Prior to Vegzelma  patient complaining of headache.  MD notified, order for 650 Tylenol  PO once ordered.   Patient now complaining of nausea, MD notified.  Message received from Donzell Gallery RN/Dr. Cheree Cords patient can ha 10 mg PO compazine  X1.  Treatment given today per MD orders. Vital signs stable.  No complaints at this time.  Discharge from clinic ambulatory in stable condition.  Alert and oriented X 3.  Follow up with [facility] as scheduled.

## 2023-08-18 NOTE — Patient Instructions (Signed)
 [facility] [place] - A DEPT OF [place]  Discharge Instructions: Thank you for choosing [facility] to provide your oncology and hematology care.  If you have a lab appointment with the Cancer Center - please note that after April 8th, 2024, all labs will be drawn in the cancer center.  You do not have to check in or register with the main entrance as you have in the past but will complete your check-in in the cancer center.  Wear comfortable clothing and clothing appropriate for easy access to any Portacath or PICC line.   We strive to give you quality time with your provider. You may need to reschedule your appointment if you arrive late (15 or more minutes).  Arriving late affects you and other patients whose appointments are after yours.  Also, if you miss three or more appointments without notifying the office, you may be dismissed from the clinic at the provider's discretion.      For prescription refill requests, have your pharmacy contact our office and allow 72 hours for refills to be completed.    Today you received the following chemotherapy and/or immunotherapy agents Vegzelma    To help prevent nausea and vomiting after your treatment, we encourage you to take your nausea medication as directed.  BELOW ARE SYMPTOMS THAT SHOULD BE REPORTED IMMEDIATELY: *FEVER GREATER THAN 100.4 F (38 C) OR HIGHER *CHILLS OR SWEATING *NAUSEA AND VOMITING THAT IS NOT CONTROLLED WITH YOUR NAUSEA MEDICATION *UNUSUAL SHORTNESS OF BREATH *UNUSUAL BRUISING OR BLEEDING *URINARY PROBLEMS (pain or burning when urinating, or frequent urination) *BOWEL PROBLEMS (unusual diarrhea, constipation, pain near the anus) TENDERNESS IN MOUTH AND THROAT WITH OR WITHOUT PRESENCE OF ULCERS (sore throat, sores in mouth, or a toothache) UNUSUAL RASH, SWELLING OR PAIN  UNUSUAL VAGINAL DISCHARGE OR ITCHING   Items with * indicate a potential emergency and should be followed up as  soon as possible or go to the Emergency Department if any problems should occur.  Please show the CHEMOTHERAPY ALERT CARD or IMMUNOTHERAPY ALERT CARD at check-in to the Emergency Department and triage nurse.  Should you have questions after your visit or need to cancel or reschedule your appointment, please contact [facility] [place] - A DEPT OF [facility] [phone]  and follow the prompts.  Office hours are 8:00 a.m. to 4:30 p.m. Monday - Friday. Please note that voicemails left after 4:00 p.m. may not be returned until the following business day.  We are closed weekends and major holidays. You have access to a nurse at all times for urgent questions. Please call the main number to the clinic [phone] and follow the prompts.  For any non-urgent questions, you may also contact your provider using MyChart. We now offer e-Visits for anyone 51 and older to request care online for non-urgent symptoms. For details visit [email or web address].   Also download the MyChart app! Go to the app store, search "MyChart", open the app, select [facility], and log in with your MyChart username and password.

## 2023-08-19 LAB — CA 125: Cancer Antigen (CA) 125: 14.6 U/mL (ref 0.0–38.1)

## 2023-08-19 LAB — GLUCOSE, CAPILLARY: Glucose-Capillary: 80 mg/dL (ref 70–99)

## 2023-08-21 ENCOUNTER — Other Ambulatory Visit: Payer: Self-pay

## 2023-08-21 MED ORDER — PREDNISONE 50 MG PO TABS: ORAL_TABLET | ORAL | 0 refills | Status: DC

## 2023-08-21 NOTE — Telephone Encounter (Signed)
 Patient called requesting refill on Prednisone  for her CT scan that is coming up. Prednisone  refill sent in per Dr. Milburn Aliment standing order.

## 2023-08-25 ENCOUNTER — Telehealth: Payer: Self-pay | Admitting: Hematology

## 2023-08-25 ENCOUNTER — Other Ambulatory Visit: Payer: Self-pay

## 2023-08-26 ENCOUNTER — Inpatient Hospital Stay

## 2023-08-26 ENCOUNTER — Ambulatory Visit (HOSPITAL_COMMUNITY)

## 2023-08-26 ENCOUNTER — Other Ambulatory Visit: Payer: Self-pay

## 2023-08-28 ENCOUNTER — Encounter: Payer: Self-pay | Admitting: Hematology

## 2023-08-29 ENCOUNTER — Other Ambulatory Visit: Payer: Self-pay

## 2023-09-02 ENCOUNTER — Ambulatory Visit (HOSPITAL_COMMUNITY)
Admission: RE | Admit: 2023-09-02 | Discharge: 2023-09-02 | Disposition: A | Discharge status: Home / Self Care | Source: Ambulatory Visit | Attending: Hematology | Admitting: Hematology

## 2023-09-02 ENCOUNTER — Encounter: Payer: Self-pay | Admitting: Hematology

## 2023-09-02 ENCOUNTER — Inpatient Hospital Stay: Attending: Hematology

## 2023-09-02 VITALS — BP 153/86 | HR 98 | Temp 97.6°F | Resp 18

## 2023-09-02 DIAGNOSIS — E1142 Type 2 diabetes mellitus with diabetic polyneuropathy: Secondary | ICD-10-CM | POA: Insufficient documentation

## 2023-09-02 DIAGNOSIS — Z5112 Encounter for antineoplastic immunotherapy: Secondary | ICD-10-CM | POA: Insufficient documentation

## 2023-09-02 DIAGNOSIS — C562 Malignant neoplasm of left ovary: Secondary | ICD-10-CM | POA: Insufficient documentation

## 2023-09-02 DIAGNOSIS — Z79899 Other long term (current) drug therapy: Secondary | ICD-10-CM | POA: Diagnosis not present

## 2023-09-02 DIAGNOSIS — M8580 Other specified disorders of bone density and structure, unspecified site: Secondary | ICD-10-CM | POA: Insufficient documentation

## 2023-09-02 DIAGNOSIS — Z7984 Long term (current) use of oral hypoglycemic drugs: Secondary | ICD-10-CM | POA: Diagnosis not present

## 2023-09-02 DIAGNOSIS — Z95828 Presence of other vascular implants and grafts: Secondary | ICD-10-CM

## 2023-09-02 DIAGNOSIS — I2510 Atherosclerotic heart disease of native coronary artery without angina pectoris: Secondary | ICD-10-CM | POA: Insufficient documentation

## 2023-09-02 DIAGNOSIS — Z5111 Encounter for antineoplastic chemotherapy: Secondary | ICD-10-CM | POA: Insufficient documentation

## 2023-09-02 DIAGNOSIS — I10 Essential (primary) hypertension: Secondary | ICD-10-CM | POA: Insufficient documentation

## 2023-09-02 MED ORDER — SODIUM CHLORIDE 0.9% FLUSH
10.0000 mL | INTRAVENOUS | Status: DC | PRN
  Administered 2023-09-02: 10 mL via INTRAVENOUS

## 2023-09-02 MED ORDER — IOHEXOL 300 MG/ML  SOLN
75.0000 mL | Freq: Once | INTRAMUSCULAR | Status: AC | PRN
  Administered 2023-09-02: 75 mL via INTRAVENOUS

## 2023-09-02 MED ORDER — IOHEXOL 9 MG/ML PO SOLN
500.0000 mL | ORAL | Status: AC
  Administered 2023-09-02: 500 mL via ORAL

## 2023-09-02 MED ORDER — HEPARIN SOD (PORK) LOCK FLUSH 100 UNIT/ML IV SOLN
500.0000 [IU] | Freq: Once | INTRAVENOUS | Status: AC
  Administered 2023-09-02: 500 [IU] via INTRAVENOUS

## 2023-09-02 NOTE — Progress Notes (Signed)
 Patient presents today to have port accessed for CT, Patients port flushed without difficulty.  Good blood return noted with no bruising or swelling noted at site.  Patient to have CT scan done and return to have port de-accessed,

## 2023-09-03 ENCOUNTER — Other Ambulatory Visit

## 2023-09-03 ENCOUNTER — Ambulatory Visit

## 2023-09-03 ENCOUNTER — Ambulatory Visit: Admitting: Hematology

## 2023-09-08 ENCOUNTER — Inpatient Hospital Stay (HOSPITAL_BASED_OUTPATIENT_CLINIC_OR_DEPARTMENT_OTHER): Admitting: Hematology

## 2023-09-08 ENCOUNTER — Inpatient Hospital Stay

## 2023-09-08 ENCOUNTER — Other Ambulatory Visit: Payer: Self-pay

## 2023-09-08 VITALS — BP 141/48 | HR 77 | Temp 97.6°F | Resp 20

## 2023-09-08 DIAGNOSIS — C562 Malignant neoplasm of left ovary: Secondary | ICD-10-CM

## 2023-09-08 DIAGNOSIS — Z5112 Encounter for antineoplastic immunotherapy: Secondary | ICD-10-CM | POA: Diagnosis not present

## 2023-09-08 LAB — COMPREHENSIVE METABOLIC PANEL WITH GFR
ALT: 18 U/L (ref 0–44)
AST: 16 U/L (ref 15–41)
Albumin: 3.6 g/dL (ref 3.5–5.0)
Alkaline Phosphatase: 80 U/L (ref 38–126)
Anion gap: 10 (ref 5–15)
BUN: 17 mg/dL (ref 8–23)
CO2: 22 mmol/L (ref 22–32)
Calcium: 8.9 mg/dL (ref 8.9–10.3)
Chloride: 100 mmol/L (ref 98–111)
Creatinine, Ser: 1.15 mg/dL — ABNORMAL HIGH (ref 0.44–1.00)
GFR, Estimated: 49 mL/min — ABNORMAL LOW (ref >60)
Glucose, Bld: 178 mg/dL — ABNORMAL HIGH (ref 70–99)
Potassium: 4.2 mmol/L (ref 3.5–5.1)
Sodium: 132 mmol/L — ABNORMAL LOW (ref 135–145)
Total Bilirubin: 0.9 mg/dL (ref 0.0–1.2)
Total Protein: 6.9 g/dL (ref 6.5–8.1)

## 2023-09-08 LAB — CBC WITH DIFFERENTIAL/PLATELET
Abs Immature Granulocytes: 0.02 K/uL (ref 0.00–0.07)
Basophils Absolute: 0 K/uL (ref 0.0–0.1)
Basophils Relative: 1 %
Eosinophils Absolute: 0.2 K/uL (ref 0.0–0.5)
Eosinophils Relative: 3 %
HCT: 29.5 % — ABNORMAL LOW (ref 36.0–46.0)
Hemoglobin: 9.5 g/dL — ABNORMAL LOW (ref 12.0–15.0)
Immature Granulocytes: 0 %
Lymphocytes Relative: 25 %
Lymphs Abs: 1.6 K/uL (ref 0.7–4.0)
MCH: 31.6 pg (ref 26.0–34.0)
MCHC: 32.2 g/dL (ref 30.0–36.0)
MCV: 98 fL (ref 80.0–100.0)
Monocytes Absolute: 0.5 K/uL (ref 0.1–1.0)
Monocytes Relative: 8 %
Neutro Abs: 4.1 K/uL (ref 1.7–7.7)
Neutrophils Relative %: 63 %
Platelets: 221 K/uL (ref 150–400)
RBC: 3.01 MIL/uL — ABNORMAL LOW (ref 3.87–5.11)
RDW: 14.7 % (ref 11.5–15.5)
WBC: 6.4 K/uL (ref 4.0–10.5)
nRBC: 0 % (ref 0.0–0.2)

## 2023-09-08 LAB — MAGNESIUM: Magnesium: 1.8 mg/dL (ref 1.7–2.4)

## 2023-09-08 LAB — URINALYSIS, DIPSTICK ONLY
Bilirubin Urine: NEGATIVE
Glucose, UA: NEGATIVE mg/dL
Hgb urine dipstick: NEGATIVE
Ketones, ur: NEGATIVE mg/dL
Nitrite: NEGATIVE
Protein, ur: NEGATIVE mg/dL
Specific Gravity, Urine: 1.004 — ABNORMAL LOW (ref 1.005–1.030)
pH: 6 (ref 5.0–8.0)

## 2023-09-08 LAB — FERRITIN: Ferritin: 491 ng/mL — ABNORMAL HIGH (ref 11–307)

## 2023-09-08 MED ORDER — SODIUM CHLORIDE 0.9 % IV SOLN
15.0000 mg/kg | Freq: Once | INTRAVENOUS | Status: AC
  Administered 2023-09-08: 1300 mg via INTRAVENOUS
  Filled 2023-09-08: qty 50

## 2023-09-08 MED ORDER — CIPROFLOXACIN HCL 500 MG PO TABS: 500.0000 mg | ORAL_TABLET | Freq: Two times a day (BID) | ORAL | 0 refills | Status: DC

## 2023-09-08 MED ORDER — SODIUM CHLORIDE 0.9 % IV SOLN: INTRAVENOUS | Status: DC

## 2023-09-08 MED ORDER — HEPARIN SOD (PORK) LOCK FLUSH 100 UNIT/ML IV SOLN
500.0000 [IU] | Freq: Once | INTRAVENOUS | Status: AC | PRN
  Administered 2023-09-08: 500 [IU]

## 2023-09-08 MED ORDER — SODIUM CHLORIDE 0.9% FLUSH
10.0000 mL | INTRAVENOUS | Status: DC | PRN
  Administered 2023-09-08: 10 mL

## 2023-09-08 MED ORDER — SODIUM CHLORIDE 0.9% FLUSH
10.0000 mL | INTRAVENOUS | Status: AC
  Administered 2023-09-08: 10 mL

## 2023-09-08 NOTE — Progress Notes (Signed)
 [name] [address] [place] [address]    Clinic Day:  09/08/2023  Referring physician: Eloy Herring, MD  Patient Care Team: Eloy Herring, MD as PCP - General (Gynecologic Oncology) Rogers Hai, MD as Medical Oncologist (Medical Oncology) [name], RN as Oncology Nurse Navigator (Oncology) [name], [name], NP as Nurse Practitioner (Cardiothoracic Surgery)   ASSESSMENT & PLAN:   Assessment: 1. Stage IIIb high-grade serous ovarian carcinoma: - Presentation with abdominal distention and bloating sensation. - CT abdomen at outside hospital on 12/05/2020 with findings concerning for ovarian neoplasm. - CT chest without contrast on 12/18/2020 with scattered solid pulmonary nodules measuring up to 3 mm, indeterminate. - CA125 on 12/05/2020-10000 - 12/18/2020 exploratory laparotomy, bilateral salpingo-oophorectomy, infra gastric omentectomy, R0 primary tumor debulking by Dr. Eloy at [facility]. - Pathology-left ovary high-grade serous carcinoma, greatest dimension 26.5 cm, left ovarian capsule intact.  Fallopian tube surface involvement present, omentum involvement present, largest extrapelvic peritoneal focus-macroscopic, peritoneal/ascitic fluid involvement negative.  PT3BPNX.  FIGO stage IIIb. - She was evaluated for FLORA 5 clinical trial, found not to be a candidate. - Germline mutation testing was negative. - 6 cycles of carboplatin  and paclitaxel  from 02/11/2021 through 05/28/2021.  She has refused niraparib  maintenance. - CTAP on 11/10/2022 showed recurrence in the left pelvic sidewall. - She met with Dr. Eloy and felt not a candidate for debulking.  She is considered platinum sensitive. - Carboplatin , Doxil  started on 01/12/2023, cycle 6 on 06/16/2023, maintenance bevacizumab  started on 07/23/2023.    2. Social/family history: - She is seen with her daughter-in-law today.  She lives by herself.  She ambulates with the help of cane.  She  retired after working at [employer].  She is non-smoker. - Brother had prostate cancer.  Another brother had throat cancer.  Sister had kidney cancer.  Grandson had bladder cancer at age 64.  Paternal uncle had head and neck cancer.    Plan: 1. Stage IIIb high-grade serous ovarian carcinoma: - CTAP (05/11/2020): Diminished size of soft tissue nodule at the left vaginal cuff measuring 1.8 x 1.5 cm, previously 3.3 x 2.4 cm.  Unchanged small nodule just anteriorly measuring 0.5 cm.  Unchanged 0.6 cm peritoneal nodule in the left upper quadrant adjacent to the splenic flexure.  No new evidence of adenopathy or metastatic disease. - She completed 6 cycles of Doxil  and bevacizumab  on 06/16/2023.  She is on bevacizumab  maintenance at this time and is tolerating very well. - We reviewed CT CAP from 09/02/2023: Stable 6 mm omental nodule in the left upper abdomen.  Stable nodular density adjacent to the left vaginal cuff.  No new nodules seen. - I have recommended continuing bevacizumab  maintenance until progression. - Labs today: Normal LFTs.  Creatinine 1.1 and stable.  Hemoglobin is gradually improving from myelosuppression.  CA125 has been stable and normal. - Continue bevacizumab  every 3 weeks and RTC 6 weeks for toxicity assessment. - She complained of dysuria.  Large amount of leukocytes in the urine.  Will give her Cipro  500 mg twice daily for 5 days. - Will schedule next CT CAP in 4 months.  If it is stable, we will switch to every 6 months scans.   2.  Hypomagnesemia: - Continue magnesium  twice daily.  Magnesium  is normal.   3.  Peripheral neuropathy: - Numbness in the feet and fingertips has been stable.  4.  High risk drug monitoring: - Last 2D echo on 01/07/2023 with LVEF 65 to  70%.  She does not have any PND or orthopnea.  She is off of Doxil  at this time.    No orders of the defined types were placed in this encounter.     [name],acting as a Neurosurgeon for Alean Stands, MD.,have documented all relevant documentation on the behalf of Alean Stands, MD,as directed by  Alean Stands, MD while in the presence of Alean Stands, MD.  I, Alean Stands MD, have reviewed the above documentation for accuracy and completeness, and I agree with the above.     Alean Stands, MD   7/8/20253:06 PM  CHIEF COMPLAINT:   Diagnosis: ovarian cancer    Cancer Staging  Ovarian cancer on left [facility]) Staging form: Ovary, Fallopian Tube, and Primary Peritoneal Carcinoma, AJCC 8th Edition - Clinical stage from 01/30/2021: FIGO Stage IIIB (cT3b, cNX, cM0) - Signed by Stands Alean, MD on 01/30/2021    Prior Therapy: 1. Exploratory laparotomy, BSO, and infra-gastric omentectomy on 12/19/2020 with Dr. Eloy  2. Carboplatin  (AUC 6) / Paclitaxel  (175) q21d x 6 cycles 02/11/2021 - 05/28/2021   Current Therapy:  carboplatin  and Doxil  with bevacizumab     HISTORY OF PRESENT ILLNESS:   Oncology History  Ovarian cancer on left [facility])  01/07/2021 Initial Diagnosis   Ovarian cancer on left [facility])   01/30/2021 Cancer Staging   Staging form: Ovary, Fallopian Tube, and Primary Peritoneal Carcinoma, AJCC 8th Edition - Clinical stage from 01/30/2021: FIGO Stage IIIB (cT3b, cNX, cM0) - Signed by Stands Alean, MD on 01/30/2021 Histologic grade (G): G3 Histologic grading system: 4 grade system   02/11/2021 - 05/28/2021 Chemotherapy   Patient is on Treatment Plan : OVARIAN Carboplatin  (AUC 6) / Paclitaxel  (175) q21d x 6 cycles     01/12/2023 -  Chemotherapy   Patient is on Treatment Plan : OVARIAN Liposomal Doxorubicin  D1 + Carboplatin  D1 + Bevacizumab  D1,15 q28d / Bevacizumab  q21d        INTERVAL HISTORY:   Rebecca [name] is a 78 y.o. female presenting to clinic today for follow up of ovarian cancer. She was last seen by me on 07/23/23.  Since her last visit, she underwent CT AP on 09/02/23 that found: Stable exam since 05/12/2023. No acute  intra-abdominal or pelvic pathology. Aortic Atherosclerosis.  Today, she states that she is doing well overall. Her appetite level is at 100%. Her energy level is at 80%. Tiernan is tolerating treatment well. She notes headaches and nausea a few days after treatment.   She notes one episode of LLQ pain yesterday that resolved on its own. There is tenderness on palpated.   She reports constipation she believes is due to the magnesium  and iron supplements she takes. Sharene also notes dysuria and thinks she may have a UTI.   PAST MEDICAL HISTORY:   Past Medical History: Past Medical History:  Diagnosis Date   Anemia    CAD (coronary artery disease)    DM2 (diabetes mellitus, type 2) (HCC)    GERD (gastroesophageal reflux disease)    HTN (hypertension)    Ovarian ca (HCC)    Port-A-Cath in place 02/02/2021    Surgical History: Past Surgical History:  Procedure Laterality Date   IR IMAGING GUIDED PORT INSERTION  02/08/2021    Social History: Social History   Socioeconomic History   Marital status: Single    Spouse name: Not on file   Number of children: Not on file   Years of education: Not on file   Highest education level: Not  on file  Occupational History   Not on file  Tobacco Use   Smoking status: Not on file   Smokeless tobacco: Not on file  Substance and Sexual Activity   Alcohol use: Not on file   Drug use: Not on file   Sexual activity: Not on file  Other Topics Concern   Not on file  Social History Narrative   Not on file   Social Drivers of Health   Financial Resource Strain: Low Risk  (02/05/2021)   Overall Financial Resource Strain (CARDIA)    Difficulty of Paying Living Expenses: Not hard at all  Food Insecurity: No Food Insecurity (02/05/2021)   Hunger Vital Sign    Worried About Running Out of Food in the Last Year: Never true    Ran Out of Food in the Last Year: Never true  Transportation Needs: Unmet Transportation Needs (02/05/2021)   PRAPARE -  Administrator, Civil Service (Medical): Yes    Lack of Transportation (Non-Medical): No  Physical Activity: Not on file  Stress: Stress Concern Present (02/05/2021)   [employer] of Occupational Health - Occupational Stress Questionnaire    Feeling of Stress : To some extent  Social Connections: Moderately Isolated (02/05/2021)   Social Connection and Isolation Panel    Frequency of Communication with Friends and Family: More than three times a week    Frequency of Social Gatherings with Friends and Family: More than three times a week    Attends Religious Services: More than 4 times per year    Active Member of [employer] or Organizations: No    Attends Banker Meetings: Never    Marital Status: Widowed  Intimate Partner Violence: Not on file    Family History: No family history on file.  Current Medications:  Current Outpatient Medications:    acetaminophen  (TYLENOL ) 325 MG tablet, Take 3 tablets (975 mg total) by mouth every 6 (six) hours as needed for moderate pain., Disp: 360 tablet, Rfl: 2   atorvastatin (LIPITOR) 40 MG tablet, Take 40 mg by mouth at bedtime., Disp: , Rfl:    azelastine (ASTELIN) 0.1 % nasal spray, Place 1 spray into both nostrils 2 (two) times daily as needed for allergies., Disp: , Rfl:    B-D ULTRAFINE III SHORT PEN 31G X 8 MM MISC, SMARTSIG:1 Each SUB-Q Daily, Disp: , Rfl:    baclofen (LIORESAL) 10 MG tablet, Take 10 mg by mouth 3 (three) times daily as needed for muscle spasms., Disp: , Rfl:    ciprofloxacin  (CIPRO ) 500 MG tablet, Take 1 tablet (500 mg total) by mouth 2 (two) times daily., Disp: 10 tablet, Rfl: 0   clopidogrel (PLAVIX) 75 MG tablet, Take 75 mg by mouth daily., Disp: , Rfl:    diphenhydrAMINE -zinc acetate (BENADRYL ) cream, Apply 1 application topically 3 (three) times daily as needed for itching., Disp: , Rfl:    docusate sodium  (COLACE) 100 MG capsule, Take 1 capsule (100 mg total) by mouth daily as needed for mild  constipation., Disp: 30 capsule, Rfl: 1   ferrous sulfate 325 (65 FE) MG tablet, Take 325 mg by mouth every evening., Disp: , Rfl:    furosemide  (LASIX ) 20 MG tablet, Take 1 tablet (20 mg total) by mouth in the morning., Disp: 30 tablet, Rfl: 2   gabapentin  (NEURONTIN ) 300 MG capsule, Take 1 capsule (300 mg total) by mouth 3 (three) times daily., Disp: 60 capsule, Rfl: 2   lidocaine -prilocaine  (EMLA ) cream, Apply to affected area  once, Disp: 30 g, Rfl: 3   losartan (COZAAR) 50 MG tablet, Take 50 mg by mouth at bedtime., Disp: , Rfl:    magnesium  oxide (MAG-OX) 400 (240 Mg) MG tablet, Take 1 tablet (400 mg total) by mouth 2 (two) times daily., Disp: 60 tablet, Rfl: 6   Misc. Devices MISC, Please provide patient with 3 diabetic nutritional supplements per day., Disp: 1 each, Rfl: 11   nitrofurantoin , macrocrystal-monohydrate, (MACROBID ) 100 MG capsule, TAKE 1 CAPSULE BY MOUTH EVERY 12 HOURS URINARY TRACT INFECTION FOR 7 DAYS, Disp: , Rfl:    pantoprazole (PROTONIX) 20 MG tablet, Take 20 mg by mouth daily., Disp: , Rfl:    pioglitazone (ACTOS) 15 MG tablet, Take 15 mg by mouth daily., Disp: , Rfl:    predniSONE  (DELTASONE ) 50 MG tablet, Take 13hrs, 7hrs and 1hr before port placementTake 13hrs, 7hrs and 1hr before procedure, Disp: 3 tablet, Rfl: 0   prochlorperazine  (COMPAZINE ) 10 MG tablet, Take 1 tablet (10 mg total) by mouth every 6 (six) hours as needed for nausea or vomiting., Disp: 60 tablet, Rfl: 3   SOLIQUA 100-33 UNT-MCG/ML SOPN, Inject 45 Units into the skin daily., Disp: , Rfl:    Tetrahydrozoline HCl (REDNESS RELIEVER EYE DROPS OP), Place 1 drop into both eyes daily as needed (redness)., Disp: , Rfl:    traMADol  (ULTRAM ) 50 MG tablet, Take 1 tablet (50 mg total) by mouth every 12 (twelve) hours as needed., Disp: 60 tablet, Rfl: 0 No current facility-administered medications for this visit.  Facility-Administered Medications Ordered in Other Visits:    0.9 %  sodium chloride  infusion, ,  Intravenous, Continuous, Rogers Hai, MD, Stopped at 09/08/23 1502   sodium chloride  flush (NS) 0.9 % injection 10 mL, 10 mL, Intracatheter, PRN, Khole Arterburn, MD, 10 mL at 09/08/23 1501   Allergies: Allergies  Allergen Reactions   Aspirin Hives   Mometasone Itching   Naproxen Sodium Itching   Penicillin G Swelling   Triamcinolone Itching   Codeine Palpitations   Ibuprofen Rash   Iodinated Contrast Media Itching and Rash    REVIEW OF SYSTEMS:   Review of Systems  Constitutional:  Negative for chills, fatigue and fever.  HENT:   Negative for lump/mass, mouth sores, nosebleeds, sore throat and trouble swallowing.   Eyes:  Negative for eye problems.  Respiratory:  Positive for cough and shortness of breath.   Cardiovascular:  Negative for chest pain, leg swelling and palpitations.  Gastrointestinal:  Positive for constipation and nausea. Negative for abdominal pain, diarrhea and vomiting.  Genitourinary:  Positive for dysuria. Negative for bladder incontinence, difficulty urinating, frequency, hematuria and nocturia.   Musculoskeletal:  Negative for arthralgias, back pain, flank pain, myalgias and neck pain.  Skin:  Negative for itching and rash.  Neurological:  Positive for headaches. Negative for dizziness and numbness.       +tingling in hands and feet  Hematological:  Does not bruise/bleed easily.  Psychiatric/Behavioral:  Positive for sleep disturbance. Negative for depression and suicidal ideas. The patient is not nervous/anxious.   All other systems reviewed and are negative.    VITALS:   There were no vitals taken for this visit.  Wt Readings from Last 3 Encounters:  09/08/23 195 lb 12.3 oz (88.8 kg)  08/18/23 195 lb 9.6 oz (88.7 kg)  07/23/23 199 lb 15.3 oz (90.7 kg)    There is no height or weight on file to calculate BMI.  Performance status (ECOG): 1 - Symptomatic but completely ambulatory  PHYSICAL EXAM:   Physical Exam Vitals and nursing  note reviewed. Exam conducted with a chaperone present.  Constitutional:      Appearance: Normal appearance.  Cardiovascular:     Rate and Rhythm: Normal rate and regular rhythm.     Pulses: Normal pulses.     Heart sounds: Normal heart sounds.  Pulmonary:     Effort: Pulmonary effort is normal.     Breath sounds: Normal breath sounds.  Abdominal:     Palpations: Abdomen is soft. There is no hepatomegaly, splenomegaly or mass.     Tenderness: There is no abdominal tenderness.  Musculoskeletal:     Right lower leg: No edema.     Left lower leg: No edema.  Lymphadenopathy:     Cervical: No cervical adenopathy.     Right cervical: No superficial, deep or posterior cervical adenopathy.    Left cervical: No superficial, deep or posterior cervical adenopathy.     Upper Body:     Right upper body: No supraclavicular or axillary adenopathy.     Left upper body: No supraclavicular or axillary adenopathy.  Neurological:     General: No focal deficit present.     Mental Status: She is alert and oriented to person, place, and time.  Psychiatric:        Mood and Affect: Mood normal.        Behavior: Behavior normal.     LABS:      Latest Ref Rng & Units 09/08/2023   10:52 AM 08/18/2023   12:21 PM 07/23/2023   12:27 PM  CBC  WBC 4.0 - 10.5 K/uL 6.4  8.5  8.4   Hemoglobin 12.0 - 15.0 g/dL 9.5  9.2  8.9   Hematocrit 36.0 - 46.0 % 29.5  28.5  27.1   Platelets 150 - 400 K/uL 221  282  309       Latest Ref Rng & Units 09/08/2023   10:52 AM 08/18/2023   12:21 PM 06/16/2023    9:10 AM  CMP  Glucose 70 - 99 mg/dL 821  890  881   BUN 8 - 23 mg/dL 17  16  25    Creatinine 0.44 - 1.00 mg/dL 8.84  8.66  8.84   Sodium 135 - 145 mmol/L 132  136  133   Potassium 3.5 - 5.1 mmol/L 4.2  4.4  3.9   Chloride 98 - 111 mmol/L 100  102  99   CO2 22 - 32 mmol/L 22  24  25    Calcium 8.9 - 10.3 mg/dL 8.9  9.5  9.0   Total Protein 6.5 - 8.1 g/dL 6.9  7.7  6.4   Total Bilirubin 0.0 - 1.2 mg/dL 0.9  0.8  0.5    Alkaline Phos 38 - 126 U/L 80  113  88   AST 15 - 41 U/L 16  16  16    ALT 0 - 44 U/L 18  14  13       No results found for: CEA1, CEA / No results found for: CEA1, CEA No results found for: PSA1 No results found for: CAN199 Lab Results  Component Value Date   CAN125 14.6 08/18/2023    No results found for: STEPHANY RINGS, A1GS, A2GS, BETS, BETA2SER, GAMS, MSPIKE, SPEI Lab Results  Component Value Date   TIBC 343 11/06/2022   TIBC 365 06/24/2022   FERRITIN 491 (H) 09/08/2023   FERRITIN 301 11/06/2022   FERRITIN 186 06/24/2022   IRONPCTSAT 19 11/06/2022  IRONPCTSAT 19 06/24/2022   Lab Results  Component Value Date   LDH 119 02/11/2021     STUDIES:   CT ABDOMEN PELVIS W CONTRAST Result Date: 09/02/2023 CLINICAL DATA:  Ovarian cancer restaging. Status post chemotherapy, oophorectomy, and omentectomy. EXAM: CT ABDOMEN AND PELVIS WITH CONTRAST TECHNIQUE: Multidetector CT imaging of the abdomen and pelvis was performed using the standard protocol following bolus administration of intravenous contrast. RADIATION DOSE REDUCTION: This exam was performed according to the departmental dose-optimization program which includes automated exposure control, adjustment of the mA and/or kV according to patient size and/or use of iterative reconstruction technique. CONTRAST:  75mL OMNIPAQUE  IOHEXOL  300 MG/ML  SOLN COMPARISON:  CT abdomen pelvis dated 05/12/2023. FINDINGS: Lower chest: The visualized lung bases are clear. There is coronary vascular calcification and calcification of the mitral annulus. No intra-abdominal free air or free fluid. Hepatobiliary: Subcentimeter hypodense focus in the right lobe of the liver is too small to characterize but similar to prior CT. No new liver lesion. No biliary dilatation. Cholecystectomy. Pancreas: Unremarkable. No pancreatic ductal dilatation or surrounding inflammatory changes. Spleen: Normal in size without focal  abnormality. Adrenals/Urinary Tract: The adrenal glands unremarkable. There is no hydronephrosis on either side. The visualized ureters and urinary bladder appear unremarkable. Stomach/Bowel: There is no bowel obstruction or active inflammation. The appendix is not visualized, likely surgically absent. Vascular/Lymphatic: Moderate aortoiliac atherosclerotic disease. The IVC is unremarkable. No portal venous gas. No adenopathy. Stable 6 mm omental nodule in the left upper abdomen adjacent to the splenic flexure. Reproductive: Hysterectomy. Similar appearance of nodular density adjacent to the left vaginal cough. No new nodule. Other: Midline vertical anterior pelvic wall incisional scar. There is a shallow midline incisional hernia containing a short segment of small bowel. Musculoskeletal: Osteopenia with degenerative changes. No acute osseous pathology. IMPRESSION: 1. Stable exam since 05/12/2023. 2. No acute intra-abdominal or pelvic pathology. 3.  Aortic Atherosclerosis (ICD10-I70.0). Electronically Signed   By: Vanetta Chou M.D.   On: 09/02/2023 21:31

## 2023-09-08 NOTE — Progress Notes (Signed)
 Patient has been examined by Dr. Ellin Saba. Vital signs and labs have been reviewed by MD - ANC, Creatinine, LFTs, hemoglobin, and platelets are within treatment parameters per M.D. - pt may proceed with treatment.  Primary RN and pharmacy notified.

## 2023-09-08 NOTE — Progress Notes (Signed)
Patient presents today for chemotherapy infusion. Patient is in satisfactory condition with no new complaints voiced.  Vital signs are stable.  Labs reviewed by Dr. [name] during the office visit and all labs are within treatment parameters.  We will proceed with treatment per MD orders.   Patient tolerated treatment well with no complaints voiced.  Patient left ambulatory in stable condition.  Vital signs stable at discharge.  Follow up as scheduled.       

## 2023-09-08 NOTE — Patient Instructions (Signed)
 [facility] [place] - [facility]  Discharge Instructions: Thank you for choosing [facility] to provide your oncology and hematology care.  If you have a lab appointment with the Cancer Center - please note that after April 8th, 2024, all labs will be drawn in the cancer center.  You do not have to check in or register with the main entrance as you have in the past but will complete your check-in in the cancer center.  Wear comfortable clothing and clothing appropriate for easy access to any Portacath or PICC line.   We strive to give you quality time with your provider. You may need to reschedule your appointment if you arrive late (15 or more minutes).  Arriving late affects you and other patients whose appointments are after yours.  Also, if you miss three or more appointments without notifying the office, you may be dismissed from the clinic at the provider's discretion.      For prescription refill requests, have your pharmacy contact our office and allow 72 hours for refills to be completed.    Today you received the following chemotherapy and/or immunotherapy agents Vegzelma.  Bevacizumab Injection What is this medication? BEVACIZUMAB (be va SIZ yoo mab) treats some types of cancer. It works by blocking a protein that causes cancer cells to grow and multiply. This helps to slow or stop the spread of cancer cells. It is a monoclonal antibody. This medicine may be used for other purposes; ask your health care provider or pharmacist if you have questions. COMMON BRAND NAME(S): Alymsys, Avastin, MVASI, Rosaland Lao What should I tell my care team before I take this medication? They need to know if you have any of these conditions: Blood clots Coughing up blood Having or recent surgery Heart failure High blood pressure History of a connection between 2 or more body parts that do not usually connect (fistula) History of a tear in your  stomach or intestines Protein in your urine An unusual or allergic reaction to bevacizumab, other medications, foods, dyes, or preservatives Pregnant or trying to get pregnant Breast-feeding How should I use this medication? This medication is injected into a vein. It is given by your care team in a hospital or clinic setting. Talk to your care team the use of this medication in children. Special care may be needed. Overdosage: If you think you have taken too much of this medicine contact a poison control center or emergency room at once. NOTE: This medicine is only for you. Do not share this medicine with others. What if I miss a dose? Keep appointments for follow-up doses. It is important not to miss your dose. Call your care team if you are unable to keep an appointment. What may interact with this medication? Interactions are not expected. This list may not describe all possible interactions. Give your health care provider a list of all the medicines, herbs, non-prescription drugs, or dietary supplements you use. Also tell them if you smoke, drink alcohol, or use illegal drugs. Some items may interact with your medicine. What should I watch for while using this medication? Your condition will be monitored carefully while you are receiving this medication. You may need blood work while taking this medication. This medication may make you feel generally unwell. This is not uncommon as chemotherapy can affect healthy cells as well as cancer cells. Report any side effects. Continue your course of treatment even though you feel  ill unless your care team tells you to stop. This medication may increase your risk to bruise or bleed. Call your care team if you notice any unusual bleeding. Before having surgery, talk to your care team to make sure it is ok. This medication can increase the risk of poor healing of your surgical site or wound. You will need to stop this medication for 28 days before  surgery. After surgery, wait at least 28 days before restarting this medication. Make sure the surgical site or wound is healed enough before restarting this medication. Talk to your care team if questions. Talk to your care team if you may be pregnant. Serious birth defects can occur if you take this medication during pregnancy and for 6 months after the last dose. Contraception is recommended while taking this medication and for 6 months after the last dose. Your care team can help you find the option that works for you. Do not breastfeed while taking this medication and for 6 months after the last dose. This medication can cause infertility. Talk to your care team if you are concerned about your fertility. What side effects may I notice from receiving this medication? Side effects that you should report to your care team as soon as possible: Allergic reactions--skin rash, itching, hives, swelling of the face, lips, tongue, or throat Bleeding--bloody or black, tar-like stools, vomiting blood or Rebecca [name] material that looks like coffee grounds, red or dark Sylvain Hasten urine, small red or purple spots on skin, unusual bruising or bleeding Blood clot--pain, swelling, or warmth in the leg, shortness of breath, chest pain Heart attack--pain or tightness in the chest, shoulders, arms, or jaw, nausea, shortness of breath, cold or clammy skin, feeling faint or lightheaded Heart failure--shortness of breath, swelling of the ankles, feet, or hands, sudden weight gain, unusual weakness or fatigue Increase in blood pressure Infection--fever, chills, cough, sore throat, wounds that don't heal, pain or trouble when passing urine, general feeling of discomfort or being unwell Infusion reactions--chest pain, shortness of breath or trouble breathing, feeling faint or lightheaded Kidney injury--decrease in the amount of urine, swelling of the ankles, hands, or feet Stomach pain that is severe, does not go away, or gets  worse Stroke--sudden numbness or weakness of the face, arm, or leg, trouble speaking, confusion, trouble walking, loss of balance or coordination, dizziness, severe headache, change in vision Sudden and severe headache, confusion, change in vision, seizures, which may be signs of posterior reversible encephalopathy syndrome (PRES) Side effects that usually do not require medical attention (report to your care team if they continue or are bothersome): Back pain Change in taste Diarrhea Dry skin Increased tears Nosebleed This list may not describe all possible side effects. Call your doctor for medical advice about side effects. You may report side effects to FDA at 1-800-FDA-1088. Where should I keep my medication? This medication is given in a hospital or clinic. It will not be stored at home. NOTE: This sheet is a summary. It may not cover all possible information. If you have questions about this medicine, talk to your doctor, pharmacist, or health care provider.  2024 Elsevier/Gold Standard (2021-07-05 00:00:00)       To help prevent nausea and vomiting after your treatment, we encourage you to take your nausea medication as directed.  BELOW ARE SYMPTOMS THAT SHOULD BE REPORTED IMMEDIATELY: *FEVER GREATER THAN 100.4 F (38 C) OR HIGHER *CHILLS OR SWEATING *NAUSEA AND VOMITING THAT IS NOT CONTROLLED WITH YOUR NAUSEA MEDICATION *UNUSUAL SHORTNESS  OF BREATH *UNUSUAL BRUISING OR BLEEDING *URINARY PROBLEMS (pain or burning when urinating, or frequent urination) *BOWEL PROBLEMS (unusual diarrhea, constipation, pain near the anus) TENDERNESS IN MOUTH AND THROAT WITH OR WITHOUT PRESENCE OF ULCERS (sore throat, sores in mouth, or a toothache) UNUSUAL RASH, SWELLING OR PAIN  UNUSUAL VAGINAL DISCHARGE OR ITCHING   Items with * indicate a potential emergency and should be followed up as soon as possible or go to the Emergency Department if any problems should occur.  Please show the  CHEMOTHERAPY ALERT CARD or IMMUNOTHERAPY ALERT CARD at check-in to the Emergency Department and triage nurse.  Should you have questions after your visit or need to cancel or reschedule your appointment, please contact [facility] [place] - A DEPT OF Eligha Bridegroom [facility] [phone]  and follow the prompts.  Office hours are 8:00 a.m. to 4:30 p.m. Monday - Friday. Please note that voicemails left after 4:00 p.m. may not be returned until the following business day.  We are closed weekends and major holidays. You have access to a nurse at all times for urgent questions. Please call the main number to the clinic [phone] and follow the prompts.  For any non-urgent questions, you may also contact your provider using MyChart. We now offer e-Visits for anyone 92 and older to request care online for non-urgent symptoms. For details visit [email or web address].   Also download the MyChart app! Go to the app store, search "MyChart", open the app, select , and log in with your MyChart username and password.

## 2023-09-08 NOTE — Patient Instructions (Addendum)
 [facility] at [facility] Discharge Instructions   You were seen and examined today by Dr. Rogers.  He reviewed the results of your lab work which are normal/stable.   He reviewed the results of your CT scan which was stable. 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   We will proceed with your treatment today.   Return as scheduled.    Thank you for choosing [facility] at [facility] to provide your oncology and hematology care.  To afford each patient quality time with our provider, please arrive at least 15 minutes before your scheduled appointment time.   If you have a lab appointment with the Cancer Center please come in thru the Main Entrance and check in at the main information desk.  You need to re-schedule your appointment should you arrive 10 or more minutes late.  We strive to give you quality time with our providers, and arriving late affects you and other patients whose appointments are after yours.  Also, if you no show three or more times for appointments you may be dismissed from the clinic at the providers discretion.     Again, thank you for choosing [facility].  Our hope is that these requests will decrease the amount of time that you wait before being seen by our physicians.       _____________________________________________________________  Should you have questions after your visit to [facility], please contact our office at [phone] and follow the prompts.  Our office hours are 8:00 a.m. and 4:30 p.m. Monday - Friday.  Please note that voicemails left after 4:00 p.m. may not be returned until the following business day.  We are closed weekends and major holidays.  You do have access to a nurse 24-7, just call the main number to the clinic [phone] and do not press any options, hold on the line and a nurse will answer the phone.    For prescription refill requests, have your pharmacy  contact our office and allow 72 hours.    Due to Covid, you will need to wear a mask upon entering the hospital. If you do not have a mask, a mask will be given to you at the Main Entrance upon arrival. For doctor visits, patients may have 1 support person age 86 or older with them. For treatment visits, patients can not have anyone with them due to social distancing guidelines and our immunocompromised population.

## 2023-09-09 ENCOUNTER — Other Ambulatory Visit: Payer: Self-pay

## 2023-09-09 LAB — CA 125: Cancer Antigen (CA) 125: 14.4 U/mL (ref 0.0–38.1)

## 2023-09-30 ENCOUNTER — Inpatient Hospital Stay

## 2023-09-30 ENCOUNTER — Inpatient Hospital Stay: Admitting: Oncology

## 2023-09-30 VITALS — BP 160/72 | HR 99 | Resp 19 | Wt 195.0 lb

## 2023-09-30 DIAGNOSIS — C562 Malignant neoplasm of left ovary: Secondary | ICD-10-CM

## 2023-09-30 DIAGNOSIS — Z5112 Encounter for antineoplastic immunotherapy: Secondary | ICD-10-CM | POA: Diagnosis not present

## 2023-09-30 LAB — CBC WITH DIFFERENTIAL/PLATELET
Abs Immature Granulocytes: 0.02 K/uL (ref 0.00–0.07)
Basophils Absolute: 0 K/uL (ref 0.0–0.1)
Basophils Relative: 0 %
Eosinophils Absolute: 0.2 K/uL (ref 0.0–0.5)
Eosinophils Relative: 2 %
HCT: 30.5 % — ABNORMAL LOW (ref 36.0–46.0)
Hemoglobin: 10.1 g/dL — ABNORMAL LOW (ref 12.0–15.0)
Immature Granulocytes: 0 %
Lymphocytes Relative: 22 %
Lymphs Abs: 1.6 K/uL (ref 0.7–4.0)
MCH: 31.9 pg (ref 26.0–34.0)
MCHC: 33.1 g/dL (ref 30.0–36.0)
MCV: 96.2 fL (ref 80.0–100.0)
Monocytes Absolute: 0.5 K/uL (ref 0.1–1.0)
Monocytes Relative: 7 %
Neutro Abs: 4.9 K/uL (ref 1.7–7.7)
Neutrophils Relative %: 69 %
Platelets: 211 K/uL (ref 150–400)
RBC: 3.17 MIL/uL — ABNORMAL LOW (ref 3.87–5.11)
RDW: 14.5 % (ref 11.5–15.5)
WBC: 7.2 K/uL (ref 4.0–10.5)
nRBC: 0 % (ref 0.0–0.2)

## 2023-09-30 LAB — MAGNESIUM: Magnesium: 1.7 mg/dL (ref 1.7–2.4)

## 2023-09-30 MED ORDER — HEPARIN SOD (PORK) LOCK FLUSH 100 UNIT/ML IV SOLN
500.0000 [IU] | Freq: Once | INTRAVENOUS | Status: AC | PRN
  Administered 2023-09-30: 500 [IU]

## 2023-09-30 MED ORDER — SODIUM CHLORIDE 0.9 % IV SOLN: INTRAVENOUS | Status: DC

## 2023-09-30 MED ORDER — SODIUM CHLORIDE 0.9 % IV SOLN
15.0000 mg/kg | Freq: Once | INTRAVENOUS | Status: AC
  Administered 2023-09-30: 1300 mg via INTRAVENOUS
  Filled 2023-09-30: qty 48

## 2023-09-30 NOTE — Progress Notes (Signed)

## 2023-09-30 NOTE — Patient Instructions (Signed)
 [facility] [place] [facility]  Discharge Instructions: Thank you for choosing [facility] to provide your oncology and hematology care.  If you have a lab appointment with the Cancer Center - please note that after April 8th, 2024, all labs will be drawn in the cancer center.  You do not have to check in or register with the main entrance as you have in the past but will complete your check-in in the cancer center.  Wear comfortable clothing and clothing appropriate for easy access to any Portacath or PICC line.   We strive to give you quality time with your provider. You may need to reschedule your appointment if you arrive late (15 or more minutes).  Arriving late affects you and other patients whose appointments are after yours.  Also, if you miss three or more appointments without notifying the office, you may be dismissed from the clinic at the provider's discretion.      For prescription refill requests, have your pharmacy contact our office and allow 72 hours for refills to be completed.    Today you received the following chemotherapy and/or immunotherapy agents avastin   To help prevent nausea and vomiting after your treatment, we encourage you to take your nausea medication as directed.  BELOW ARE SYMPTOMS THAT SHOULD BE REPORTED IMMEDIATELY: *FEVER GREATER THAN 100.4 F (38 C) OR HIGHER *CHILLS OR SWEATING *NAUSEA AND VOMITING THAT IS NOT CONTROLLED WITH YOUR NAUSEA MEDICATION *UNUSUAL SHORTNESS OF BREATH *UNUSUAL BRUISING OR BLEEDING *URINARY PROBLEMS (pain or burning when urinating, or frequent urination) *BOWEL PROBLEMS (unusual diarrhea, constipation, pain near the anus) TENDERNESS IN MOUTH AND THROAT WITH OR WITHOUT PRESENCE OF ULCERS (sore throat, sores in mouth, or a toothache) UNUSUAL RASH, SWELLING OR PAIN  UNUSUAL VAGINAL DISCHARGE OR ITCHING   Items with * indicate a potential emergency and should be followed up as  soon as possible or go to the Emergency Department if any problems should occur.  Please show the CHEMOTHERAPY ALERT CARD or IMMUNOTHERAPY ALERT CARD at check-in to the Emergency Department and triage nurse.  Should you have questions after your visit or need to cancel or reschedule your appointment, please contact [facility] [place] - A DEPT OF [facility]ide[facility] [phone]  and follow the prompts.  Office hours are 8:00 a.m. to 4:30 p.m. Monday - Friday. Please note that voicemails left after 4:00 p.m. may not be returned until the following business day.  We are closed weekends and major holidays. You have access to a nurse at all times for urgent questions. Please call the main number to the clinic [phone] and follow the prompts.  For any non-urgent questions, you may also contact your provider using MyChart. We now offer e-Visits for anyone 66 and older to request care online for non-urgent symptoms. For details visit [email or web address].   Also download the MyChart app! Go to the app store, search "MyChart", open the app, select [place], and log in with your MyChart username and password.

## 2023-10-01 ENCOUNTER — Other Ambulatory Visit: Payer: Self-pay

## 2023-10-01 LAB — CA 125: Cancer Antigen (CA) 125: 14.2 U/mL (ref 0.0–38.1)

## 2023-10-21 ENCOUNTER — Inpatient Hospital Stay

## 2023-10-21 ENCOUNTER — Other Ambulatory Visit

## 2023-10-21 ENCOUNTER — Inpatient Hospital Stay (HOSPITAL_BASED_OUTPATIENT_CLINIC_OR_DEPARTMENT_OTHER): Attending: Hematology | Admitting: Oncology

## 2023-10-21 ENCOUNTER — Ambulatory Visit: Admitting: Oncology

## 2023-10-21 ENCOUNTER — Encounter: Payer: Self-pay | Admitting: Oncology

## 2023-10-21 ENCOUNTER — Inpatient Hospital Stay: Attending: Hematology

## 2023-10-21 VITALS — BP 147/79 | HR 92 | Temp 96.7°F | Resp 18

## 2023-10-21 VITALS — BP 167/68

## 2023-10-21 DIAGNOSIS — Z5112 Encounter for antineoplastic immunotherapy: Secondary | ICD-10-CM | POA: Insufficient documentation

## 2023-10-21 DIAGNOSIS — C562 Malignant neoplasm of left ovary: Secondary | ICD-10-CM | POA: Insufficient documentation

## 2023-10-21 DIAGNOSIS — D649 Anemia, unspecified: Secondary | ICD-10-CM

## 2023-10-21 DIAGNOSIS — Z79899 Other long term (current) drug therapy: Secondary | ICD-10-CM | POA: Diagnosis not present

## 2023-10-21 LAB — CBC WITH DIFFERENTIAL/PLATELET
Abs Immature Granulocytes: 0.01 K/uL (ref 0.00–0.07)
Basophils Absolute: 0 K/uL (ref 0.0–0.1)
Basophils Relative: 0 %
Eosinophils Absolute: 0.2 K/uL (ref 0.0–0.5)
Eosinophils Relative: 3 %
HCT: 30.5 % — ABNORMAL LOW (ref 36.0–46.0)
Hemoglobin: 10.1 g/dL — ABNORMAL LOW (ref 12.0–15.0)
Immature Granulocytes: 0 %
Lymphocytes Relative: 24 %
Lymphs Abs: 1.7 K/uL (ref 0.7–4.0)
MCH: 31.8 pg (ref 26.0–34.0)
MCHC: 33.1 g/dL (ref 30.0–36.0)
MCV: 95.9 fL (ref 80.0–100.0)
Monocytes Absolute: 0.6 K/uL (ref 0.1–1.0)
Monocytes Relative: 9 %
Neutro Abs: 4.3 K/uL (ref 1.7–7.7)
Neutrophils Relative %: 64 %
Platelets: 223 K/uL (ref 150–400)
RBC: 3.18 MIL/uL — ABNORMAL LOW (ref 3.87–5.11)
RDW: 14.4 % (ref 11.5–15.5)
WBC: 6.8 K/uL (ref 4.0–10.5)
nRBC: 0 % (ref 0.0–0.2)

## 2023-10-21 LAB — URINALYSIS, DIPSTICK ONLY
Bilirubin Urine: NEGATIVE
Glucose, UA: NEGATIVE mg/dL
Hgb urine dipstick: NEGATIVE
Ketones, ur: NEGATIVE mg/dL
Nitrite: NEGATIVE
Protein, ur: 30 mg/dL — AB
Specific Gravity, Urine: 1.009 (ref 1.005–1.030)
pH: 6 (ref 5.0–8.0)

## 2023-10-21 LAB — COMPREHENSIVE METABOLIC PANEL WITH GFR
ALT: 15 U/L (ref 0–44)
AST: 18 U/L (ref 15–41)
Albumin: 3.6 g/dL (ref 3.5–5.0)
Alkaline Phosphatase: 79 U/L (ref 38–126)
Anion gap: 11 (ref 5–15)
BUN: 14 mg/dL (ref 8–23)
CO2: 23 mmol/L (ref 22–32)
Calcium: 8.8 mg/dL — ABNORMAL LOW (ref 8.9–10.3)
Chloride: 105 mmol/L (ref 98–111)
Creatinine, Ser: 1.02 mg/dL — ABNORMAL HIGH (ref 0.44–1.00)
GFR, Estimated: 57 mL/min — ABNORMAL LOW (ref >60)
Glucose, Bld: 131 mg/dL — ABNORMAL HIGH (ref 70–99)
Potassium: 3.8 mmol/L (ref 3.5–5.1)
Sodium: 139 mmol/L (ref 135–145)
Total Bilirubin: 0.8 mg/dL (ref 0.0–1.2)
Total Protein: 6.9 g/dL (ref 6.5–8.1)

## 2023-10-21 LAB — FERRITIN: Ferritin: 355 ng/mL — ABNORMAL HIGH (ref 11–307)

## 2023-10-21 LAB — MAGNESIUM: Magnesium: 1.5 mg/dL — ABNORMAL LOW (ref 1.7–2.4)

## 2023-10-21 MED ORDER — SODIUM CHLORIDE 0.9 % IV SOLN
15.0000 mg/kg | Freq: Once | INTRAVENOUS | Status: AC
  Administered 2023-10-21: 1300 mg via INTRAVENOUS
  Filled 2023-10-21: qty 48

## 2023-10-21 MED ORDER — SODIUM CHLORIDE 0.9 % IV SOLN: INTRAVENOUS | Status: DC

## 2023-10-21 MED ORDER — MAGNESIUM SULFATE 2 GM/50ML IV SOLN
2.0000 g | Freq: Once | INTRAVENOUS | Status: AC
  Administered 2023-10-21: 2 g via INTRAVENOUS
  Filled 2023-10-21: qty 50

## 2023-10-21 NOTE — Progress Notes (Signed)
 Patients port flushed without difficulty.  Good blood return noted with no bruising or swelling noted at site. Patient remains accessed for treatment.

## 2023-10-21 NOTE — Patient Instructions (Signed)
 [facility] [place] - A DEPT OF [place]  Discharge Instructions: Thank you for choosing [facility] to provide your oncology and hematology care.  If you have a lab appointment with the Cancer Center - please note that after April 8th, 2024, all labs will be drawn in the cancer center.  You do not have to check in or register with the main entrance as you have in the past but will complete your check-in in the cancer center.  Wear comfortable clothing and clothing appropriate for easy access to any Portacath or PICC line.   We strive to give you quality time with your provider. You may need to reschedule your appointment if you arrive late (15 or more minutes).  Arriving late affects you and other patients whose appointments are after yours.  Also, if you miss three or more appointments without notifying the office, you may be dismissed from the clinic at the provider's discretion.      For prescription refill requests, have your pharmacy contact our office and allow 72 hours for refills to be completed.    Today you received the following chemotherapy and/or immunotherapy agents Vegzelma  and Magnesium , return as scheduled.   To help prevent nausea and vomiting after your treatment, we encourage you to take your nausea medication as directed.  BELOW ARE SYMPTOMS THAT SHOULD BE REPORTED IMMEDIATELY: *FEVER GREATER THAN 100.4 F (38 C) OR HIGHER *CHILLS OR SWEATING *NAUSEA AND VOMITING THAT IS NOT CONTROLLED WITH YOUR NAUSEA MEDICATION *UNUSUAL SHORTNESS OF BREATH *UNUSUAL BRUISING OR BLEEDING *URINARY PROBLEMS (pain or burning when urinating, or frequent urination) *BOWEL PROBLEMS (unusual diarrhea, constipation, pain near the anus) TENDERNESS IN MOUTH AND THROAT WITH OR WITHOUT PRESENCE OF ULCERS (sore throat, sores in mouth, or a toothache) UNUSUAL RASH, SWELLING OR PAIN  UNUSUAL VAGINAL DISCHARGE OR ITCHING   Items with * indicate a potential  emergency and should be followed up as soon as possible or go to the Emergency Department if any problems should occur.  Please show the CHEMOTHERAPY ALERT CARD or IMMUNOTHERAPY ALERT CARD at check-in to the Emergency Department and triage nurse.  Should you have questions after your visit or need to cancel or reschedule your appointment, please contact [facility]  - A DEPT OF [facility] [phone]  and follow the prompts.  Office hours are 8:00 a.m. to 4:30 p.m. Monday - Friday. Please note that voicemails left after 4:00 p.m. may not be returned until the following business day.  We are closed weekends and major holidays. You have access to a nurse at all times for urgent questions. Please call the main number to the clinic [phone] and follow the prompts.  For any non-urgent questions, you may also contact your provider using MyChart. We now offer e-Visits for anyone 78 and older to request care online for non-urgent symptoms. For details visit [email or web address].   Also download the MyChart app! Go to the app store, search MyChart, open the app, select [place], and log in with your MyChart username and password.

## 2023-10-21 NOTE — Assessment & Plan Note (Signed)
-  This has been intermittently low since starting treatment. -Reports that she ran out magnesium  supplements and needs to pick them up today. -Denies any diarrhea while taking magnesium  supplements.

## 2023-10-21 NOTE — Assessment & Plan Note (Addendum)
-   CTAP (05/11/2020): Diminished size of soft tissue nodule at the left vaginal cuff measuring 1.8 x 1.5 cm, previously 3.3 x 2.4 cm.  Unchanged small nodule just anteriorly measuring 0.5 cm.  Unchanged 0.6 cm peritoneal nodule in the left upper quadrant adjacent to the splenic flexure.  No new evidence of adenopathy or metastatic disease. - She completed 6 cycles of Doxil  and bevacizumab  on 06/16/2023.  She is on bevacizumab  maintenance at this time and is tolerating very well. - CT CAP from 09/02/2023: Stable 6 mm omental nodule in the left upper abdomen.  Stable nodular density adjacent to the left vaginal cuff.  No new nodules seen. - Dr. Rogers recommended continuing bevacizumab  maintenance until progression. - Labs today: Normal LFTs.  Creatinine 1.02.  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  CA125 has been stable and normal. -Proceed with bevacizumab  today and every 3 weeks and RTC 6 weeks for assessment. - Will schedule next CT CAP in 3 months.  -Plan is for her to see Dr. Davonna back in 6 weeks.

## 2023-10-21 NOTE — Progress Notes (Signed)
 [facility] OFFICE PROGRESS NOTE  Eloy Herring, MD  ASSESSMENT & PLAN:  Assessment & Plan Ovarian cancer on left [facility]) - CTAP (05/11/2020): Diminished size of soft tissue nodule at the left vaginal cuff measuring 1.8 x 1.5 cm, previously 3.3 x 2.4 cm.  Unchanged small nodule just anteriorly measuring 0.5 cm.  Unchanged 0.6 cm peritoneal nodule in the left upper quadrant adjacent to the splenic flexure.  No new evidence of adenopathy or metastatic disease. - She completed 6 cycles of Doxil  and bevacizumab  on 06/16/2023.  She is on bevacizumab  maintenance at this time and is tolerating very well. - CT CAP from 09/02/2023: Stable 6 mm omental nodule in the left upper abdomen.  Stable nodular density adjacent to the left vaginal cuff.  No new nodules seen. - Dr. Rogers recommended continuing bevacizumab  maintenance until progression. - Labs today: Normal LFTs.  Creatinine 1.02.  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  CA125 has been stable and normal. -Proceed with bevacizumab  today and every 3 weeks and RTC 6 weeks for assessment. - Will schedule next CT CAP in 3 months.  -Plan is for her to see Dr. Davonna back in 6 weeks. Anemia, unspecified type -Mild anemia with hemoglobin of 10.1 thought to be due to myelosuppression. -Will add on iron levels, B12 levels and folate at her next visit. -She is currently taking iron supplements and tolerating well. Hypomagnesemia -This has been intermittently low since starting treatment. -Reports that she ran out magnesium  supplements and needs to pick them up today. -Denies any diarrhea while taking magnesium  supplements.    Orders Placed This Encounter  Procedures   Iron and TIBC ([facility] ONLY)    Standing Status:   Future    Expected Date:   11/11/2023    Expiration Date:   02/09/2024   Ferritin    Standing Status:   Future    Expected Date:    11/11/2023    Expiration Date:   02/09/2024   CBC with Differential    Standing Status:   Future    Expected Date:   11/11/2023    Expiration Date:   02/09/2024   Methylmalonic acid, serum    Standing Status:   Future    Expected Date:   11/11/2023    Expiration Date:   02/09/2024    INTERVAL HISTORY: Patient returns for follow-up for ovarian cancer.  Patient reports she has been tolerating bevacizumab  pretty well in comparison to the chemotherapy.  Reports numbness and tingling in her fingers and toes but this is improving.  Reports  burning with urination which she was recently treated with Cipro .  Feels the symptoms have improved some.  Has a chronic cough and shortness of breath.  Has occasional constipation and nausea.  She denies any hospitalizations, surgeries or changes to her baseline health.  We reviewed, CBC, ferritin, CMP.  Urinalysis showed large amounts of leukocytes and protein.  Encouraged her to drink plenty of fluids.  Weight is stable.  Appetite is 100% energy levels are 70%.  SUMMARY OF HEMATOLOGIC HISTORY: 1. Stage IIIb high-grade serous ovarian carcinoma: - Presentation with abdominal distention and bloating sensation. - CT abdomen at outside hospital on 12/05/2020 with findings concerning for ovarian neoplasm. - CT chest without contrast on 12/18/2020 with scattered solid pulmonary nodules measuring up to 3 mm, indeterminate. - CA125 on 12/05/2020-10000 - 12/18/2020 exploratory laparotomy, bilateral salpingo-oophorectomy, infra gastric omentectomy, R0 primary tumor  debulking by Dr. Eloy at [facility]. - Pathology-left ovary high-grade serous carcinoma, greatest dimension 26.5 cm, left ovarian capsule intact.  Fallopian tube surface involvement present, omentum involvement present, largest extrapelvic peritoneal focus-macroscopic, peritoneal/ascitic fluid involvement negative.  PT3BPNX.  FIGO stage IIIb. - She was evaluated for FLORA 5 clinical trial, found not to be a  candidate. - Germline mutation testing was negative. - 6 cycles of carboplatin  and paclitaxel  from 02/11/2021 through 05/28/2021.  She has refused niraparib  maintenance. - CTAP on 11/10/2022 showed recurrence in the left pelvic sidewall. - She met with Dr. Eloy and felt not a candidate for debulking.  She is considered platinum sensitive. - Carboplatin , Doxil  started on 01/12/2023, cycle 6 on 06/16/2023, maintenance bevacizumab  started on 07/23/2023  No results found for: CBC  Vitals:   10/21/23 1122  BP: (!) 167/68   Review of Systems  Respiratory:  Positive for cough and shortness of breath.   Gastrointestinal:  Positive for constipation and nausea.  Genitourinary:  Positive for dysuria.  Neurological:  Positive for sensory change and headaches.  Psychiatric/Behavioral:  The patient has insomnia.    Physical Exam Constitutional:      Appearance: Normal appearance. She is obese.  Cardiovascular:     Rate and Rhythm: Normal rate and regular rhythm.  Pulmonary:     Effort: Pulmonary effort is normal.     Breath sounds: Normal breath sounds.  Abdominal:     General: Bowel sounds are normal.     Palpations: Abdomen is soft. There is no hepatomegaly or splenomegaly.     Tenderness: There is abdominal tenderness in the left upper quadrant.  Musculoskeletal:        General: No swelling. Normal range of motion.  Neurological:     Mental Status: She is alert and oriented to person, place, and time. Mental status is at baseline.     I spent 25 minutes dedicated to the care of this patient (face-to-face and non-face-to-face) on the date of the encounter to include what is described in the assessment and plan.,

## 2023-10-21 NOTE — Progress Notes (Signed)
 Patient is okay for treatment per Randall Hope NP with BP 167/68. Recheck 145/68 Magnesium  is 1.5 Magnesium  given per standing orders. Patient tolerated therapy with no complaints voiced. Side effects with management reviewed with understanding verbalized. Port site clean and dry with no bruising or swelling noted at site. Good blood return noted before and after administration of therapy. Band aid applied. Patient left in satisfactory condition with VSS and no s/s of distress noted.

## 2023-10-22 ENCOUNTER — Encounter: Payer: Self-pay | Admitting: Oncology

## 2023-10-22 ENCOUNTER — Other Ambulatory Visit: Payer: Self-pay

## 2023-10-22 LAB — CA 125: Cancer Antigen (CA) 125: 12.8 U/mL (ref 0.0–38.1)

## 2023-10-24 NOTE — Addendum Note (Signed)
 Addended by: Jennica Tagliaferri on: 10/24/2023 04:20 PM   Modules accepted: Level of Service

## 2023-10-30 ENCOUNTER — Encounter: Payer: Self-pay | Admitting: Oncology

## 2023-11-10 ENCOUNTER — Inpatient Hospital Stay: Attending: Hematology

## 2023-11-11 ENCOUNTER — Other Ambulatory Visit: Payer: Self-pay | Admitting: *Deleted

## 2023-11-11 ENCOUNTER — Inpatient Hospital Stay: Attending: Hematology

## 2023-11-11 ENCOUNTER — Inpatient Hospital Stay

## 2023-11-11 VITALS — BP 166/88 | HR 93 | Temp 97.6°F | Resp 18

## 2023-11-11 DIAGNOSIS — C562 Malignant neoplasm of left ovary: Secondary | ICD-10-CM | POA: Diagnosis present

## 2023-11-11 DIAGNOSIS — R300 Dysuria: Secondary | ICD-10-CM

## 2023-11-11 DIAGNOSIS — Z79899 Other long term (current) drug therapy: Secondary | ICD-10-CM | POA: Diagnosis not present

## 2023-11-11 DIAGNOSIS — Z5112 Encounter for antineoplastic immunotherapy: Secondary | ICD-10-CM | POA: Insufficient documentation

## 2023-11-11 LAB — URINALYSIS, DIPSTICK ONLY
Bilirubin Urine: NEGATIVE
Glucose, UA: NEGATIVE mg/dL
Ketones, ur: NEGATIVE mg/dL
Nitrite: NEGATIVE
Protein, ur: 30 mg/dL — AB
Specific Gravity, Urine: 1.012 (ref 1.005–1.030)
pH: 5 (ref 5.0–8.0)

## 2023-11-11 LAB — CBC WITH DIFFERENTIAL/PLATELET
Abs Immature Granulocytes: 0.02 K/uL (ref 0.00–0.07)
Basophils Absolute: 0 K/uL (ref 0.0–0.1)
Basophils Relative: 0 %
Eosinophils Absolute: 0.2 K/uL (ref 0.0–0.5)
Eosinophils Relative: 3 %
HCT: 32.5 % — ABNORMAL LOW (ref 36.0–46.0)
Hemoglobin: 10.7 g/dL — ABNORMAL LOW (ref 12.0–15.0)
Immature Granulocytes: 0 %
Lymphocytes Relative: 30 %
Lymphs Abs: 2.2 K/uL (ref 0.7–4.0)
MCH: 31 pg (ref 26.0–34.0)
MCHC: 32.9 g/dL (ref 30.0–36.0)
MCV: 94.2 fL (ref 80.0–100.0)
Monocytes Absolute: 0.6 K/uL (ref 0.1–1.0)
Monocytes Relative: 8 %
Neutro Abs: 4.4 K/uL (ref 1.7–7.7)
Neutrophils Relative %: 59 %
Platelets: 232 K/uL (ref 150–400)
RBC: 3.45 MIL/uL — ABNORMAL LOW (ref 3.87–5.11)
RDW: 14.2 % (ref 11.5–15.5)
WBC: 7.5 K/uL (ref 4.0–10.5)
nRBC: 0 % (ref 0.0–0.2)

## 2023-11-11 LAB — COMPREHENSIVE METABOLIC PANEL WITH GFR
ALT: 17 U/L (ref 0–44)
AST: 16 U/L (ref 15–41)
Albumin: 3.6 g/dL (ref 3.5–5.0)
Alkaline Phosphatase: 86 U/L (ref 38–126)
Anion gap: 13 (ref 5–15)
BUN: 15 mg/dL (ref 8–23)
CO2: 24 mmol/L (ref 22–32)
Calcium: 9.3 mg/dL (ref 8.9–10.3)
Chloride: 101 mmol/L (ref 98–111)
Creatinine, Ser: 1.13 mg/dL — ABNORMAL HIGH (ref 0.44–1.00)
GFR, Estimated: 50 mL/min — ABNORMAL LOW (ref >60)
Glucose, Bld: 73 mg/dL (ref 70–99)
Potassium: 3.9 mmol/L (ref 3.5–5.1)
Sodium: 138 mmol/L (ref 135–145)
Total Bilirubin: 0.5 mg/dL (ref 0.0–1.2)
Total Protein: 7.2 g/dL (ref 6.5–8.1)

## 2023-11-11 LAB — MAGNESIUM: Magnesium: 1.8 mg/dL (ref 1.7–2.4)

## 2023-11-11 LAB — FERRITIN: Ferritin: 419 ng/mL — ABNORMAL HIGH (ref 11–307)

## 2023-11-11 MED ORDER — SODIUM CHLORIDE 0.9 % IV SOLN: INTRAVENOUS | Status: DC

## 2023-11-11 MED ORDER — SODIUM CHLORIDE 0.9 % IV SOLN
15.0000 mg/kg | Freq: Once | INTRAVENOUS | Status: AC
  Administered 2023-11-11: 1300 mg via INTRAVENOUS
  Filled 2023-11-11: qty 48

## 2023-11-11 NOTE — Progress Notes (Signed)
Patient presents today for Vegzelma infusion per providers order.  Vital signs and labs within parameters for treatment.  Patient has no new complaints at this time.  Treatment given today per MD orders.  Stable during infusion without adverse affects.  Vital signs stable.  No complaints at this time.  Discharge from clinic ambulatory in stable condition.  Alert and oriented X 3.  Follow up with [facility] as scheduled.

## 2023-11-11 NOTE — Patient Instructions (Signed)
 [facility] [place] - A DEPT OF [place]  Discharge Instructions: Thank you for choosing [facility] to provide your oncology and hematology care.  If you have a lab appointment with the Cancer Center - please note that after April 8th, 2024, all labs will be drawn in the cancer center.  You do not have to check in or register with the main entrance as you have in the past but will complete your check-in in the cancer center.  Wear comfortable clothing and clothing appropriate for easy access to any Portacath or PICC line.   We strive to give you quality time with your provider. You may need to reschedule your appointment if you arrive late (15 or more minutes).  Arriving late affects you and other patients whose appointments are after yours.  Also, if you miss three or more appointments without notifying the office, you may be dismissed from the clinic at the provider's discretion.      For prescription refill requests, have your pharmacy contact our office and allow 72 hours for refills to be completed.    Today you received the following chemotherapy and/or immunotherapy agents Vegzelma    To help prevent nausea and vomiting after your treatment, we encourage you to take your nausea medication as directed.  BELOW ARE SYMPTOMS THAT SHOULD BE REPORTED IMMEDIATELY: *FEVER GREATER THAN 100.4 F (38 C) OR HIGHER *CHILLS OR SWEATING *NAUSEA AND VOMITING THAT IS NOT CONTROLLED WITH YOUR NAUSEA MEDICATION *UNUSUAL SHORTNESS OF BREATH *UNUSUAL BRUISING OR BLEEDING *URINARY PROBLEMS (pain or burning when urinating, or frequent urination) *BOWEL PROBLEMS (unusual diarrhea, constipation, pain near the anus) TENDERNESS IN MOUTH AND THROAT WITH OR WITHOUT PRESENCE OF ULCERS (sore throat, sores in mouth, or a toothache) UNUSUAL RASH, SWELLING OR PAIN  UNUSUAL VAGINAL DISCHARGE OR ITCHING   Items with * indicate a potential emergency and should be followed up as  soon as possible or go to the Emergency Department if any problems should occur.  Please show the CHEMOTHERAPY ALERT CARD or IMMUNOTHERAPY ALERT CARD at check-in to the Emergency Department and triage nurse.  Should you have questions after your visit or need to cancel or reschedule your appointment, please contact [facility] [place] - A DEPT OF [facility] [phone]  and follow the prompts.  Office hours are 8:00 a.m. to 4:30 p.m. Monday - Friday. Please note that voicemails left after 4:00 p.m. may not be returned until the following business day.  We are closed weekends and major holidays. You have access to a nurse at all times for urgent questions. Please call the main number to the clinic [phone] and follow the prompts.  For any non-urgent questions, you may also contact your provider using MyChart. We now offer e-Visits for anyone 51 and older to request care online for non-urgent symptoms. For details visit [email or web address].   Also download the MyChart app! Go to the app store, search "MyChart", open the app, select [facility], and log in with your MyChart username and password.

## 2023-11-12 ENCOUNTER — Ambulatory Visit: Payer: Self-pay | Admitting: Oncology

## 2023-11-12 LAB — CA 125: Cancer Antigen (CA) 125: 13.6 U/mL (ref 0.0–38.1)

## 2023-11-13 LAB — URINE CULTURE: Culture: 70000 — AB

## 2023-11-15 MED ORDER — SULFAMETHOXAZOLE-TRIMETHOPRIM 800-160 MG PO TABS: 1.0000 | ORAL_TABLET | Freq: Two times a day (BID) | ORAL | 0 refills | Status: DC

## 2023-11-16 ENCOUNTER — Encounter (HOSPITAL_COMMUNITY): Payer: Self-pay | Admitting: Emergency Medicine

## 2023-11-16 ENCOUNTER — Emergency Department (HOSPITAL_COMMUNITY)

## 2023-11-16 ENCOUNTER — Other Ambulatory Visit: Payer: Self-pay

## 2023-11-16 ENCOUNTER — Encounter: Payer: Self-pay | Admitting: Oncology

## 2023-11-16 ENCOUNTER — Observation Stay (HOSPITAL_COMMUNITY)
Admission: EM | Admit: 2023-11-16 | Discharge: 2023-11-17 | Disposition: A | Discharge status: Home / Self Care | Source: Other Acute Inpatient Hospital | Attending: Family Medicine | Admitting: Family Medicine

## 2023-11-16 DIAGNOSIS — E119 Type 2 diabetes mellitus without complications: Secondary | ICD-10-CM | POA: Diagnosis not present

## 2023-11-16 DIAGNOSIS — N3000 Acute cystitis without hematuria: Secondary | ICD-10-CM | POA: Diagnosis not present

## 2023-11-16 DIAGNOSIS — Z794 Long term (current) use of insulin: Secondary | ICD-10-CM | POA: Insufficient documentation

## 2023-11-16 DIAGNOSIS — G9341 Metabolic encephalopathy: Secondary | ICD-10-CM | POA: Diagnosis not present

## 2023-11-16 DIAGNOSIS — R4182 Altered mental status, unspecified: Secondary | ICD-10-CM | POA: Insufficient documentation

## 2023-11-16 DIAGNOSIS — G459 Transient cerebral ischemic attack, unspecified: Principal | ICD-10-CM | POA: Diagnosis present

## 2023-11-16 DIAGNOSIS — C562 Malignant neoplasm of left ovary: Secondary | ICD-10-CM | POA: Diagnosis not present

## 2023-11-16 DIAGNOSIS — Z7901 Long term (current) use of anticoagulants: Secondary | ICD-10-CM | POA: Insufficient documentation

## 2023-11-16 DIAGNOSIS — Z79899 Other long term (current) drug therapy: Secondary | ICD-10-CM | POA: Insufficient documentation

## 2023-11-16 DIAGNOSIS — N390 Urinary tract infection, site not specified: Secondary | ICD-10-CM | POA: Insufficient documentation

## 2023-11-16 DIAGNOSIS — R410 Disorientation, unspecified: Secondary | ICD-10-CM | POA: Diagnosis present

## 2023-11-16 DIAGNOSIS — I10 Essential (primary) hypertension: Secondary | ICD-10-CM | POA: Insufficient documentation

## 2023-11-16 DIAGNOSIS — R41841 Cognitive communication deficit: Secondary | ICD-10-CM | POA: Diagnosis not present

## 2023-11-16 DIAGNOSIS — R262 Difficulty in walking, not elsewhere classified: Secondary | ICD-10-CM | POA: Insufficient documentation

## 2023-11-16 DIAGNOSIS — M6281 Muscle weakness (generalized): Secondary | ICD-10-CM | POA: Insufficient documentation

## 2023-11-16 LAB — BASIC METABOLIC PANEL WITH GFR
Anion gap: 15 (ref 5–15)
BUN: 16 mg/dL (ref 8–23)
CO2: 22 mmol/L (ref 22–32)
Calcium: 9.3 mg/dL (ref 8.9–10.3)
Chloride: 100 mmol/L (ref 98–111)
Creatinine, Ser: 1.19 mg/dL — ABNORMAL HIGH (ref 0.44–1.00)
GFR, Estimated: 47 mL/min — ABNORMAL LOW (ref >60)
Glucose, Bld: 99 mg/dL (ref 70–99)
Potassium: 4 mmol/L (ref 3.5–5.1)
Sodium: 137 mmol/L (ref 135–145)

## 2023-11-16 LAB — ETHANOL: Alcohol, Ethyl (B): <15 mg/dL (ref <15)

## 2023-11-16 LAB — CBC WITH DIFFERENTIAL/PLATELET
Abs Immature Granulocytes: 0.02 K/uL (ref 0.00–0.07)
Basophils Absolute: 0 K/uL (ref 0.0–0.1)
Basophils Relative: 0 %
Eosinophils Absolute: 0.2 K/uL (ref 0.0–0.5)
Eosinophils Relative: 2 %
HCT: 33.3 % — ABNORMAL LOW (ref 36.0–46.0)
Hemoglobin: 11.3 g/dL — ABNORMAL LOW (ref 12.0–15.0)
Immature Granulocytes: 0 %
Lymphocytes Relative: 29 %
Lymphs Abs: 2.2 K/uL (ref 0.7–4.0)
MCH: 31.6 pg (ref 26.0–34.0)
MCHC: 33.9 g/dL (ref 30.0–36.0)
MCV: 93 fL (ref 80.0–100.0)
Monocytes Absolute: 0.7 K/uL (ref 0.1–1.0)
Monocytes Relative: 9 %
Neutro Abs: 4.3 K/uL (ref 1.7–7.7)
Neutrophils Relative %: 60 %
Platelets: 223 K/uL (ref 150–400)
RBC: 3.58 MIL/uL — ABNORMAL LOW (ref 3.87–5.11)
RDW: 14.2 % (ref 11.5–15.5)
WBC: 7.4 K/uL (ref 4.0–10.5)
nRBC: 0 % (ref 0.0–0.2)

## 2023-11-16 LAB — GLUCOSE, CAPILLARY: Glucose-Capillary: 251 mg/dL — ABNORMAL HIGH (ref 70–99)

## 2023-11-16 LAB — PROTIME-INR
INR: 0.9 (ref 0.8–1.2)
Prothrombin Time: 13 s (ref 11.4–15.2)

## 2023-11-16 LAB — CBG MONITORING, ED: Glucose-Capillary: 91 mg/dL (ref 70–99)

## 2023-11-16 LAB — APTT: aPTT: 24 s (ref 24–36)

## 2023-11-16 MED ORDER — ONDANSETRON HCL 4 MG/2ML IJ SOLN
4.0000 mg | Freq: Once | INTRAMUSCULAR | Status: AC
Start: 2023-11-16 — End: 2023-11-16
  Administered 2023-11-16: 4 mg via INTRAVENOUS
  Filled 2023-11-16: qty 2

## 2023-11-16 MED ORDER — ACETAMINOPHEN 650 MG RE SUPP: 650.0000 mg | RECTAL | Status: DC | PRN

## 2023-11-16 MED ORDER — ACETAMINOPHEN 325 MG PO TABS
650.0000 mg | ORAL_TABLET | ORAL | Status: DC | PRN
  Administered 2023-11-16 – 2023-11-17 (×2): 650 mg via ORAL
  Filled 2023-11-16 (×2): qty 2

## 2023-11-16 MED ORDER — SODIUM CHLORIDE 0.9 % IV SOLN
1.0000 g | Freq: Once | INTRAVENOUS | Status: AC
  Administered 2023-11-16: 1 g via INTRAVENOUS
  Filled 2023-11-16: qty 10

## 2023-11-16 MED ORDER — ATORVASTATIN CALCIUM 40 MG PO TABS
40.0000 mg | ORAL_TABLET | Freq: Every day | ORAL | Status: DC
  Administered 2023-11-16: 40 mg via ORAL
  Filled 2023-11-16: qty 1

## 2023-11-16 MED ORDER — PANTOPRAZOLE SODIUM 20 MG PO TBEC
20.0000 mg | DELAYED_RELEASE_TABLET | Freq: Every day | ORAL | Status: DC
Start: 2023-11-16 — End: 2023-11-17
  Administered 2023-11-17: 20 mg via ORAL
  Filled 2023-11-16 (×3): qty 1

## 2023-11-16 MED ORDER — CLOPIDOGREL BISULFATE 75 MG PO TABS
75.0000 mg | ORAL_TABLET | Freq: Every day | ORAL | Status: DC
Start: 2023-11-16 — End: 2023-11-17
  Administered 2023-11-16 – 2023-11-17 (×2): 75 mg via ORAL
  Filled 2023-11-16 (×2): qty 1

## 2023-11-16 MED ORDER — ENOXAPARIN SODIUM 40 MG/0.4ML IJ SOSY
40.0000 mg | PREFILLED_SYRINGE | INTRAMUSCULAR | Status: DC
  Administered 2023-11-16: 40 mg via SUBCUTANEOUS
  Filled 2023-11-16: qty 0.4

## 2023-11-16 MED ORDER — STROKE: EARLY STAGES OF RECOVERY BOOK
Freq: Once | Status: AC
Start: 2023-11-17 — End: 2023-11-17

## 2023-11-16 MED ORDER — DEXAMETHASONE SODIUM PHOSPHATE 10 MG/ML IJ SOLN
10.0000 mg | Freq: Once | INTRAMUSCULAR | Status: AC
  Administered 2023-11-16: 10 mg via INTRAVENOUS
  Filled 2023-11-16: qty 1

## 2023-11-16 MED ORDER — PROCHLORPERAZINE EDISYLATE 10 MG/2ML IJ SOLN
10.0000 mg | Freq: Once | INTRAMUSCULAR | Status: AC
  Administered 2023-11-16: 10 mg via INTRAVENOUS
  Filled 2023-11-16: qty 2

## 2023-11-16 MED ORDER — ACETAMINOPHEN 160 MG/5ML PO SOLN: 650.0000 mg | ORAL | Status: DC | PRN

## 2023-11-16 MED ORDER — FENTANYL CITRATE (PF) 100 MCG/2ML IJ SOLN
50.0000 ug | Freq: Once | INTRAMUSCULAR | Status: AC
  Administered 2023-11-16: 50 ug via INTRAVENOUS
  Filled 2023-11-16: qty 2

## 2023-11-16 MED ORDER — SODIUM CHLORIDE 0.9 % IV SOLN: INTRAVENOUS | Status: DC

## 2023-11-16 MED ORDER — SODIUM CHLORIDE 0.9 % IV BOLUS
1000.0000 mL | Freq: Once | INTRAVENOUS | Status: AC
  Administered 2023-11-16: 1000 mL via INTRAVENOUS

## 2023-11-16 MED ORDER — SODIUM CHLORIDE 0.9 % IV SOLN
1.0000 g | INTRAVENOUS | Status: DC
Start: 2023-11-17 — End: 2023-11-17
  Administered 2023-11-17: 1 g via INTRAVENOUS
  Filled 2023-11-16: qty 10

## 2023-11-16 MED ORDER — LOSARTAN POTASSIUM 50 MG PO TABS
50.0000 mg | ORAL_TABLET | Freq: Every day | ORAL | Status: DC
Start: 2023-11-16 — End: 2023-11-17
  Administered 2023-11-16: 50 mg via ORAL
  Filled 2023-11-16: qty 1

## 2023-11-16 MED ORDER — SENNOSIDES-DOCUSATE SODIUM 8.6-50 MG PO TABS: 1.0000 | ORAL_TABLET | Freq: Every evening | ORAL | Status: DC | PRN

## 2023-11-16 MED ORDER — INSULIN ASPART 100 UNIT/ML IJ SOLN
0.0000 [IU] | Freq: Every day | INTRAMUSCULAR | Status: DC
  Administered 2023-11-16: 3 [IU] via SUBCUTANEOUS

## 2023-11-16 MED ORDER — INSULIN ASPART 100 UNIT/ML IJ SOLN
0.0000 [IU] | Freq: Three times a day (TID) | INTRAMUSCULAR | Status: DC
  Administered 2023-11-17: 3 [IU] via SUBCUTANEOUS
  Administered 2023-11-17: 2 [IU] via SUBCUTANEOUS

## 2023-11-16 MED ORDER — MORPHINE SULFATE (PF) 4 MG/ML IV SOLN: 4.0000 mg | INTRAVENOUS | Status: DC | PRN

## 2023-11-16 MED ORDER — ACETAMINOPHEN 500 MG PO TABS
1000.0000 mg | ORAL_TABLET | Freq: Once | ORAL | Status: AC
  Administered 2023-11-16: 1000 mg via ORAL
  Filled 2023-11-16: qty 2

## 2023-11-16 MED ORDER — DIPHENHYDRAMINE HCL 25 MG PO CAPS
25.0000 mg | ORAL_CAPSULE | Freq: Once | ORAL | Status: AC
  Administered 2023-11-16: 25 mg via ORAL
  Filled 2023-11-16: qty 1

## 2023-11-16 MED ORDER — METOCLOPRAMIDE HCL 5 MG/ML IJ SOLN
10.0000 mg | Freq: Once | INTRAMUSCULAR | Status: AC
Start: 2023-11-16 — End: 2023-11-16
  Administered 2023-11-16: 10 mg via INTRAVENOUS
  Filled 2023-11-16: qty 2

## 2023-11-16 MED ORDER — GADOBUTROL 1 MMOL/ML IV SOLN
8.0000 mL | Freq: Once | INTRAVENOUS | Status: AC | PRN
  Administered 2023-11-16: 8 mL via INTRAVENOUS

## 2023-11-16 MED ORDER — MORPHINE SULFATE (PF) 4 MG/ML IV SOLN
4.0000 mg | Freq: Once | INTRAVENOUS | Status: AC
  Administered 2023-11-16: 4 mg via INTRAVENOUS
  Filled 2023-11-16: qty 1

## 2023-11-16 NOTE — ED Triage Notes (Addendum)
 Pt in by pov, sent from the cancer center for UTI symptoms, confusion and a headache that started yesterday. While in triage patient started slurring and mumbling her words and left sided facial droop noted. Episode lasted less than 1 minute and pt back to baseline. Per family who is with the patient that is new and she has not been doing that.

## 2023-11-16 NOTE — Assessment & Plan Note (Addendum)
 Transient episode of left facial droop, slurred speech that lasted about 1 minute and spontaneously resolved in the ED.  Now back to baseline.  Has also had worsening headaches over the past couple of days.  No history of stroke or strokelike symptoms.  She is on Plavix  for what appears to be carotid artery disease and reports compliance (aspirin allergy).  Head CT negative for acute abnormality.   - MRI brain W Wo contrast-no acute abnormality, shows chronic ischemic changes and parenchymal volume loss. - EDP spoke with oncology Dr Davonna-  patient's oncologic medications do have prothrombotic characteristics, specifically veg F inhibitor > recommend admission for stroke workup - Spoke with neurology, patient can stay at [facility] for TIA evaluation - Carotid Dopplers -Echocardiogram -PT, speech therapy eval -Lipid panel, HgbA1c - Resume atorvastatin  40 mg daily and Plavix   (aspirin allergy-reports hives) -IV morphine  4 mg every 4 hours as needed for headache

## 2023-11-16 NOTE — ED Provider Notes (Signed)
  Provider Note MRN:  [id number]  Arrival date & time: 11/16/23    ED Course and Medical Decision Making  Assumed care from Dr Charlyn at shift change.  See note from prior team for complete details, in brief:  Clinical Course as of 11/16/23 1637  Mon Nov 16, 2023  1533 Handoff AN 78 yo/f hx ovarian ca, dr ivana  Here w/ confusion (got lost driving), worsening ha, dysarthria/facial droop over last 24 hrs Presumed toxicity from oncology meds vs tia/seizure? Dysarthria/facial droop around 1 minute MRI wo neg Labs stable  O/p UA w/ UTI Oncology consult Neurology consult  [SG]  1617 Spoke w/ onoclogy, recommend admit stroke w/u [SG]    Clinical Course User Index [SG] Elnor Savant A, DO   MRI without acute infarct She received Rocephin  for presumed UTI from outpatient UA Patient is neurologically intact on recheck, still has some residual headache but no dysarthria or facial droop. Spoke with oncology Dr Davonna, patient's oncologic medications do have prothrombotic characteristics, specifically veg F inhibitor > recommend admission for stroke workup Spoke with neurology, patient can stay at [facility] for TIA evaluation Discussed at length with patient and family at bedside, agreeable to plan for admission.   Admit TRH  .Critical Care  Performed by: Elnor Savant LABOR, DO Authorized by: Elnor Savant LABOR, DO   Critical care provider statement:    Critical care time (minutes):  30   Critical care time was exclusive of:  Separately billable procedures and treating other patients   Critical care was necessary to treat or prevent imminent or life-threatening deterioration of the following conditions:  CNS failure or compromise   Critical care was time spent personally by me on the following activities:  Development of treatment plan with patient or surrogate, discussions with consultants, evaluation of patient's response to treatment, examination of patient, ordering and review of  laboratory studies, ordering and review of radiographic studies, ordering and performing treatments and interventions, pulse oximetry, re-evaluation of patient's condition, review of old charts and obtaining history from patient or surrogate   Care discussed with: admitting provider     Final Clinical Impressions(s) / ED Diagnoses     ICD-10-CM   1. TIA (transient ischemic attack)  G45.9     2. Altered mental status, unspecified altered mental status type  R41.82       ED Discharge Orders     None       Discharge Instructions   None        Elnor Savant LABOR, DO 11/16/23 1637

## 2023-11-16 NOTE — ED Notes (Signed)
 Pt and family asked that I inform MD that she has had 2 teeth fall out recently one fell out last night they forgot to mention it when the MD was in. I made MD Nanavati aware, said he would go back in and discuss it with the pt and family.

## 2023-11-16 NOTE — Progress Notes (Signed)
 Reached out to patient.  She states she went to church yesterday and has had nausea, vomiting and confusion off and on since. Verbalized that she has had periods of not knowing where she is since yesterday.  I told her to have her son bring her to the ER. Verbalized understanding.

## 2023-11-16 NOTE — Progress Notes (Signed)
 Patient admitted to room 334 from ED.  Patient ambulatory without complaints at this time.  Alert and oriented pleasant and cooperative.  Patient reports she was sent to the ED today around 10am by her oncology office for a kidney infection.  Patient states she is feeling well but that the doctors keep close watch on her labs.  Patient states she lives in a home by herself but has a son that checks on her frequently.  She does not get home health.

## 2023-11-16 NOTE — ED Provider Notes (Signed)
  EMERGENCY DEPARTMENT AT [facility] Provider Note   CSN: [id number] Arrival date & time: 11/16/23  1153     Patient presents with: Altered Mental Status   Rebecca [name] is a 78 y.o. female.  {Add pertinent medical, surgical, social history, OB history to HPI:32947} HPI     78 year old female with history of carotid artery disease, ovarian cancer, diabetes comes in with chief complaint of UTI, confusion, headache.  Family is at the bedside providing collateral history.  According to the patient's family, patient started acting slightly disoriented yesterday.  She had gone to the church, and then she was supposed to go to [place] to fill up gas.  However patient could not find her way to the gas station and it took her several hours before she got home.  They received a call from oncology today that patient had UTI.  When they shared with them patient's complaint of headaches, confusion they advised that patient come to the ER.  Patient states that she has been having chronic headaches ever since she was put on cancer medication.  However in the last 2 days, the headache is more severe.  The headache is frontal, dull.  She denies any neck pain.  Patient has no associated one-sided weakness, numbness, slurred speech at home, however while in the waiting room, when patient was speaking suddenly she was noted to have facial droop and slurring.  That episode lasted for about a minute and then resolved.   Prior to Admission medications   Medication Sig Start Date End Date Taking? Authorizing Provider  acetaminophen  (TYLENOL ) 325 MG tablet Take 3 tablets (975 mg total) by mouth every 6 (six) hours as needed for moderate pain. Patient taking differently: Take 650 mg by mouth every 6 (six) hours as needed for moderate pain (pain score 4-6). 03/05/21  Yes Rogers Hai, MD  atorvastatin  (LIPITOR) 40 MG tablet Take 40 mg by mouth at bedtime. 10/30/20  Yes [provider]  baclofen (LIORESAL) 10 MG tablet Take 10 mg by mouth 3 (three) times daily as needed for muscle spasms. 01/28/21  Yes [provider]  clopidogrel  (PLAVIX ) 75 MG tablet Take 75 mg by mouth daily. 11/05/20  Yes [provider]  diphenhydrAMINE -zinc acetate (BENADRYL ) cream Apply 1 application topically 3 (three) times daily as needed for itching.   Yes [provider]  docusate sodium  (COLACE) 100 MG capsule Take 1 capsule (100 mg total) by mouth daily as needed for mild constipation. 04/01/22  Yes Rogers Hai, MD  ferrous sulfate 325 (65 FE) MG tablet Take 325 mg by mouth daily. 11/26/20  Yes [provider]  furosemide  (LASIX ) 20 MG tablet Take 1 tablet (20 mg total) by mouth in the morning. Patient taking differently: Take 20 mg by mouth daily as needed for fluid. 03/26/21  Yes Rogers Hai, MD  lidocaine -prilocaine  (EMLA ) cream Apply to affected area once 01/12/23  Yes Rogers Hai, MD  losartan  (COZAAR ) 50 MG tablet Take 50 mg by mouth at bedtime. 11/06/20  Yes [provider]  magnesium  oxide (MAG-OX) 400 (240 Mg) MG tablet Take 1 tablet (400 mg total) by mouth 2 (two) times daily. 03/17/23  Yes Rogers Hai, MD  pantoprazole  (PROTONIX ) 20 MG tablet Take 20 mg by mouth daily. 11/10/20  Yes [provider]  pioglitazone (ACTOS) 15 MG tablet Take 15 mg by mouth daily. 05/28/23  Yes [provider]  SOLIQUA 100-33 UNT-MCG/ML SOPN Inject 35 Units into the skin daily.  Yes [provider]  Tetrahydrozoline HCl (REDNESS RELIEVER EYE DROPS OP) Place 1 drop into both eyes daily as needed (redness).   Yes [provider]  B-D ULTRAFINE III SHORT PEN 31G X 8 MM MISC SMARTSIG:1 Each SUB-Q Daily 03/22/21   [provider]  Misc. Devices MISC Please provide patient with 3 diabetic nutritional supplements per day. 03/28/21   Rogers Hai, MD  predniSONE  (DELTASONE ) 50 MG  tablet Take 13hrs, 7hrs and 1hr before port placementTake 13hrs, 7hrs and 1hr before procedure Patient not taking: Reported on 11/16/2023 08/21/23   Rogers Hai, MD  sulfamethoxazole -trimethoprim  (BACTRIM  DS) 800-160 MG tablet Take 1 tablet by mouth 2 (two) times daily. 11/15/23   Geofm Delon BRAVO, NP    Allergies: Aspirin, Mometasone, Naproxen sodium, Penicillin g, Triamcinolone, Codeine, Ibuprofen, and Iodinated contrast media    Review of Systems  All other systems reviewed and are negative.   Updated Vital Signs BP (!) 156/87 (BP Location: Right Arm)   Pulse 98   Temp 97.6 F (36.4 C) (Oral)   Resp 18   Ht 5' 3 (1.6 m)   Wt 88 kg   SpO2 95%   BMI 34.37 kg/m   Physical Exam Vitals and nursing note reviewed.  Constitutional:      Appearance: She is well-developed.  HENT:     Head: Atraumatic.  Eyes:     Extraocular Movements: Extraocular movements intact.     Pupils: Pupils are equal, round, and reactive to light.  Cardiovascular:     Rate and Rhythm: Normal rate.  Pulmonary:     Effort: Pulmonary effort is normal.  Musculoskeletal:     Cervical back: Normal range of motion and neck supple.  Skin:    General: Skin is warm and dry.  Neurological:     Mental Status: She is alert and oriented to person, place, and time.     Cranial Nerves: No cranial nerve deficit.     Sensory: No sensory deficit.     Motor: No weakness.     Coordination: Coordination normal.     (all labs ordered are listed, but only abnormal results are displayed) Labs Reviewed  BASIC METABOLIC PANEL WITH GFR - Abnormal; Notable for the following components:      Result Value   Creatinine, Ser 1.19 (*)    GFR, Estimated 47 (*)    All other components within normal limits  CBC WITH DIFFERENTIAL/PLATELET - Abnormal; Notable for the following components:   RBC 3.58 (*)    Hemoglobin 11.3 (*)    HCT 33.3 (*)    All other components within normal limits  CBG MONITORING, ED     EKG: None  Radiology: MR Brain W and Wo Contrast Result Date: 11/16/2023 EXAM: MRI BRAIN WITH AND WITHOUT CONTRAST 11/16/2023 02:46:27 PM TECHNIQUE: Multiplanar multisequence MRI of the head/brain was performed with and without the administration of intravenous contrast. COMPARISON: Same day CT head. CLINICAL HISTORY: Headache, new onset (Age >= 51y); headache, transient disorientation and ovarian CA history. Incomplete exam, Headache; Uncooperative, multiple attempts at scan, refused to continue after injection, ts coronal t1 vibe post. FINDINGS: BRAIN AND VENTRICLES: Mild T2/FLAIR hyperintensity in the periventricular and subcortical white matter, suggestive of mild chronic microvascular ischemic changes. Mild parenchymal volume loss. 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 No acute infarct. No acute intracranial hemorrhage. No mass effect or midline shift. No hydrocephalus. The sella is unremarkable. Normal flow voids. No  mass or abnormal enhancement. Post-contrast images are limited by motion artifact; within these limitations, there is no evidence of abnormal enhancement. ORBITS: No acute abnormality. SINUSES: Mucosal thickening in the left maxillary sinus. No acute abnormality. BONES AND SOFT TISSUES: Normal bone marrow signal and enhancement. No acute soft tissue abnormality. IMPRESSION: 1. No acute intracranial abnormality. 2. Mild chronic microvascular ischemic changes and mild parenchymal volume loss. 3. 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 Electronically signed by: Donnice Mania MD 11/16/2023 03:24 PM EDT RP Workstation: [id number]   CT Head Wo Contrast Result Date: 11/16/2023 CLINICAL DATA:  headahce, trainsient disorientation and ovarian CA hx. EXAM: CT HEAD WITHOUT CONTRAST TECHNIQUE: Contiguous axial images were obtained from the base of the skull through the  vertex without intravenous contrast. RADIATION DOSE REDUCTION: This exam was performed according to the departmental dose-optimization program which includes automated exposure control, adjustment of the mA and/or kV according to patient size and/or use of iterative reconstruction technique. COMPARISON:  None Available. 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 No acute intracranial abnormality. Specifically, no hemorrhage, hydrocephalus, mass lesion, acute infarction, or significant intracranial injury. Vascular: No hyperdense vessel or unexpected calcification. Skull: No acute calvarial abnormality. 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 No acute intracranial abnormality. Electronically Signed   By: Franky Crease M.D.   On: 11/16/2023 14:00    {Document cardiac monitor, telemetry assessment procedure when appropriate:32947} Procedures   Medications Ordered in the ED  cefTRIAXone  (ROCEPHIN ) 1 g in sodium chloride  0.9 % 100 mL IVPB (1 g Intravenous New Bag/Given 11/16/23 1540)  prochlorperazine  (COMPAZINE ) injection 10 mg (10 mg Intravenous Given 11/16/23 1356)  diphenhydrAMINE  (BENADRYL ) capsule 25 mg (25 mg Oral Given 11/16/23 1403)  fentaNYL  (SUBLIMAZE ) injection 50 mcg (50 mcg Intravenous Given 11/16/23 1356)  gadobutrol  (GADAVIST ) 1 MMOL/ML injection 8 mL (8 mLs Intravenous Contrast Given 11/16/23 1434)  dexamethasone  (DECADRON ) injection 10 mg (10 mg Intravenous Given 11/16/23 1532)  metoCLOPramide  (REGLAN ) injection 10 mg (10 mg Intravenous Given 11/16/23 1532)  morphine  (PF) 4 MG/ML injection 4 mg (4 mg Intravenous Given 11/16/23 1530)  ondansetron  (ZOFRAN ) injection 4 mg (4 mg Intravenous Given 11/16/23 1532)    Clinical Course as of 11/16/23 1546  Mon Nov 16, 2023  1533 Handoff AN Oncology consult  [SG]    Clinical Course User Index [SG] Elnor Jayson LABOR, DO    {Click here for ABCD2, HEART and other calculators REFRESH Note before signing:1}                              Medical Decision Making Amount and/or Complexity of Data Reviewed Labs: ordered. Radiology: ordered.  Risk Prescription drug management.   78 year old female with history of stroke, ovarian cancer, diabetes comes in with chief complaint of headache, disorientation yesterday and today while in the waiting room, an episode of facial droop and slurred speech.  Differential diagnosis considered for this patient includes: Stroke - ischemic vs. hemorrhagic TIA Neuropathy Myelitis Electrolyte abnormality Side effects from the cancer medication Complex partial seizure Dural venous thrombosis Metastatic brain disease  Initial plan is to get basic labs and MRI brain with and without contrast.  Reassessment: Patient's MRI is reassuring.  She has remote infarcts. CT scan of the brain was independently interpreted by me, there is no evidence of acute  bleed on it.  reviewed patient's records including urine cultures.  Will give her IV ceftriaxone .  Patient reports that the headache is better, but still present.  I will consult oncology to see if patient could be having toxicity from the targeted cancer treatment she is receiving.  If not, then we will consult neurology to see if patient needs to come in for TIA, global amnesia type finding.     Final diagnoses:  None    ED Discharge Orders     None

## 2023-11-16 NOTE — H&P (Signed)
 History and Physical    Rebecca [name] FMW:[id number] DOB: 02-24-1946 DOA: 11/16/2023  PCP: Kristine [name], NP   Patient coming from: Home  I have personally briefly reviewed patient's old medical records in [facility]  Chief Complaint: Confusion  HPI: Rebecca [name] is a 78 y.o. female with medical history significant for ovarian cancer, diabetes mellitus, coronary artery disease, hypertension. Presented to the ED with complaints of confusion and headaches, with diagnosis of UTI.  Yesterday, patient was driving around from about 830 disoriented and confused till she was contacted at about 10 PM by family.  She was trying to get to the [facility] gas station that she normally goes to and lost her way, driving around for about 2 hours. She has been having headaches with her therapy, usually resolves with Tylenol , but over the past couple of days headache has been significantly worse.  Yesterday she vomited about 5 times once today.  No abdominal pain.  No diarrhea. She reports episodes of pain with urination and she had urinalysis and urine cultures done 9/10 that grew Serratia marcescens.  ED Course: Temperature 97.6.  Heart rate 98-117.  Respirate rate 18.  Blood pressure systolic 149-156.  O2 sats 95% on room air. While in the ED- triage, speech suddenly became slurred, and she had left facial droop.  This lasted for about 10 minutes and spontaneously resolved and patient was back to baseline. CT -neg, MRI brain-chronic changes. IV Rocephin  1 g given. Benadryl , Reglan , Decadron , morphine , Compazine  4 mg given. EDP talked to oncology-some of patient's oncology medications does have prothrombotic characteristics- specifically Veg-F inhibitor, mended admission.  EDP also talked to neurology who recommended admission here at [facility] for TIA evaluation.  Review of Systems: As per HPI all other systems reviewed and negative.  Past Medical History:  Diagnosis Date   Anemia     CAD (coronary artery disease)    DM2 (diabetes mellitus, type 2) (HCC)    GERD (gastroesophageal reflux disease)    HTN (hypertension)    Ovarian ca (HCC)    Port-A-Cath in place 02/02/2021    Past Surgical History:  Procedure Laterality Date   IR IMAGING GUIDED PORT INSERTION  02/08/2021     has no history on file for tobacco use, alcohol use, and drug use.  Allergies  Allergen Reactions   Aspirin Hives   Mometasone Itching   Naproxen Sodium Itching   Penicillin G Swelling   Triamcinolone Itching   Codeine Palpitations   Ibuprofen Rash   Iodinated Contrast Media Itching and Rash   Family history of hypertension.  Prior to Admission medications   Medication Sig Start Date End Date Taking? Authorizing Provider  acetaminophen  (TYLENOL ) 325 MG tablet Take 3 tablets (975 mg total) by mouth every 6 (six) hours as needed for moderate pain. Patient taking differently: Take 650 mg by mouth every 6 (six) hours as needed for moderate pain (pain score 4-6). 03/05/21  Yes Rogers Hai, MD  atorvastatin  (LIPITOR) 40 MG tablet Take 40 mg by mouth at bedtime. 10/30/20  Yes [provider]  baclofen (LIORESAL) 10 MG tablet Take 10 mg by mouth 3 (three) times daily as needed for muscle spasms. 01/28/21  Yes [provider]  clopidogrel  (PLAVIX ) 75 MG tablet Take 75 mg by mouth daily. 11/05/20  Yes [provider]  diphenhydrAMINE -zinc acetate (BENADRYL ) cream Apply 1 application topically 3 (three) times daily as needed for itching.   Yes [provider]  docusate sodium  (COLACE) 100  MG capsule Take 1 capsule (100 mg total) by mouth daily as needed for mild constipation. 04/01/22  Yes Rogers Hai, MD  ferrous sulfate 325 (65 FE) MG tablet Take 325 mg by mouth daily. 11/26/20  Yes [provider]  furosemide  (LASIX ) 20 MG tablet Take 1 tablet (20 mg total) by mouth in the morning. Patient taking differently: Take 20 mg by mouth daily as needed  for fluid. 03/26/21  Yes Rogers Hai, MD  lidocaine -prilocaine  (EMLA ) cream Apply to affected area once 01/12/23  Yes Rogers Hai, MD  losartan  (COZAAR ) 50 MG tablet Take 50 mg by mouth at bedtime. 11/06/20  Yes [provider]  magnesium  oxide (MAG-OX) 400 (240 Mg) MG tablet Take 1 tablet (400 mg total) by mouth 2 (two) times daily. 03/17/23  Yes Rogers Hai, MD  pantoprazole  (PROTONIX ) 20 MG tablet Take 20 mg by mouth daily. 11/10/20  Yes [provider]  pioglitazone (ACTOS) 15 MG tablet Take 15 mg by mouth daily. 05/28/23  Yes [provider]  SOLIQUA 100-33 UNT-MCG/ML SOPN Inject 35 Units into the skin daily.   Yes [provider]  Tetrahydrozoline HCl (REDNESS RELIEVER EYE DROPS OP) Place 1 drop into both eyes daily as needed (redness).   Yes [provider]  B-D ULTRAFINE III SHORT PEN 31G X 8 MM MISC SMARTSIG:1 Each SUB-Q Daily 03/22/21   [provider]  Misc. Devices MISC Please provide patient with 3 diabetic nutritional supplements per day. 03/28/21   Rogers Hai, MD  predniSONE  (DELTASONE ) 50 MG tablet Take 13hrs, 7hrs and 1hr before port placementTake 13hrs, 7hrs and 1hr before procedure Patient not taking: Reported on 11/16/2023 08/21/23   Rogers Hai, MD  sulfamethoxazole -trimethoprim  (BACTRIM  DS) 800-160 MG tablet Take 1 tablet by mouth 2 (two) times daily. 11/15/23   Geofm Delon BRAVO, NP    Physical Exam: Vitals:   11/16/23 1238 11/16/23 1247  BP: (!) 156/87   Pulse: 98   Resp: 18   Temp: 97.6 F (36.4 C)   TempSrc: Oral   SpO2: 95%   Weight:  88 kg  Height:  5' 3 (1.6 m)    Constitutional: NAD, calm, comfortable Vitals:   11/16/23 1238 11/16/23 1247  BP: (!) 156/87   Pulse: 98   Resp: 18   Temp: 97.6 F (36.4 C)   TempSrc: Oral   SpO2: 95%   Weight:  88 kg  Height:  5' 3 (1.6 m)   Eyes: PERRL, lids and conjunctivae normal ENMT: Mucous membranes are moist.  Neck:  normal, supple, no masses, no thyromegaly Respiratory: clear to auscultation bilaterally, no wheezing, no crackles. Normal respiratory effort. No accessory muscle use.  Cardiovascular: Regular rate and rhythm, no murmurs / rubs / gallops. No extremity edema.  Extremeties warm. Abdomen: Distended but soft, with periumbilical hernia, no tenderness, no masses palpated. No hepatosplenomegaly.   Musculoskeletal: no clubbing / cyanosis. No joint deformity upper and lower extremities.  Skin: no rashes, lesions, ulcers. No induration Neurologic: No facial asymmetry, speech fluent, 5 out of 5 strength in bilateral upper and lower extremities, sensation intact.  Psychiatric: Normal judgment and insight. Alert and oriented x 3. Normal mood.   Labs on Admission: I have personally reviewed following labs and imaging studies  CBC: Recent Labs  Lab 11/11/23 1322 11/16/23 1311  WBC 7.5 7.4  NEUTROABS 4.4 4.3  HGB 10.7* 11.3*  HCT 32.5* 33.3*  MCV 94.2 93.0  PLT 232 223   Basic Metabolic Panel: Recent Labs  Lab 11/11/23 1322 11/16/23 1311  NA 138 137  K 3.9 4.0  CL 101 100  CO2 24 22  GLUCOSE 73 99  BUN 15 16  CREATININE 1.13* 1.19*  CALCIUM  9.3 9.3  MG 1.8  --    GFR: Estimated Creatinine Clearance: 42.3 mL/min (A) (by C-G formula based on SCr of 1.19 mg/dL (H)). Liver Function Tests: Recent Labs  Lab 11/11/23 1322  AST 16  ALT 17  ALKPHOS 86  BILITOT 0.5  PROT 7.2  ALBUMIN 3.6   CBG: Recent Labs  Lab 11/16/23 1243  GLUCAP 91   Urine analysis:    Component Value Date/Time   COLORURINE YELLOW 11/11/2023 1324   APPEARANCEUR CLOUDY (A) 11/11/2023 1324   LABSPEC 1.012 11/11/2023 1324   PHURINE 5.0 11/11/2023 1324   GLUCOSEU NEGATIVE 11/11/2023 1324   HGBUR SMALL (A) 11/11/2023 1324   BILIRUBINUR NEGATIVE 11/11/2023 1324   KETONESUR NEGATIVE 11/11/2023 1324   PROTEINUR 30 (A) 11/11/2023 1324   NITRITE NEGATIVE 11/11/2023 1324   LEUKOCYTESUR LARGE (A) 11/11/2023 1324     Radiological Exams on Admission: MR Brain W and Wo Contrast Result Date: 11/16/2023 EXAM: MRI BRAIN WITH AND WITHOUT CONTRAST 11/16/2023 02:46:27 PM TECHNIQUE: Multiplanar multisequence MRI of the head/brain was performed with and without the administration of intravenous contrast. COMPARISON: Same day CT head. CLINICAL HISTORY: Headache, new onset (Age >= 51y); headache, transient disorientation and ovarian CA history. Incomplete exam, Headache; Uncooperative, multiple attempts at scan, refused to continue after injection, ts coronal t1 vibe post. FINDINGS: BRAIN AND VENTRICLES: Mild T2/FLAIR hyperintensity in the periventricular and subcortical white matter, suggestive of mild chronic microvascular ischemic changes. Mild parenchymal volume loss. 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 No acute infarct. No acute intracranial hemorrhage. No mass effect or midline shift. No hydrocephalus. The sella is unremarkable. Normal flow voids. No mass or abnormal enhancement. Post-contrast images are limited by motion artifact; within these limitations, there is no evidence of abnormal enhancement. ORBITS: No acute abnormality. SINUSES: Mucosal thickening in the left maxillary sinus. No acute abnormality. BONES AND SOFT TISSUES: Normal bone marrow signal and enhancement. No acute soft tissue abnormality. IMPRESSION: 1. No acute intracranial abnormality. 2. Mild chronic microvascular ischemic changes and mild parenchymal volume loss. 3. 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 Electronically signed by: Donnice Mania MD 11/16/2023 03:24 PM EDT RP Workstation: [id number]   CT Head Wo Contrast Result Date: 11/16/2023 CLINICAL DATA:  headahce, trainsient disorientation and ovarian CA hx. EXAM: CT HEAD WITHOUT CONTRAST TECHNIQUE: Contiguous axial images were obtained from the base of the skull  through the vertex without intravenous contrast. RADIATION DOSE REDUCTION: This exam was performed according to the departmental dose-optimization program which includes automated exposure control, adjustment of the mA and/or kV according to patient size and/or use of iterative reconstruction technique. COMPARISON:  None Available. 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 No acute intracranial abnormality. Specifically, no hemorrhage, hydrocephalus, mass lesion, acute infarction, or significant intracranial injury. Vascular: No hyperdense vessel or unexpected calcification. Skull: No acute calvarial abnormality. 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 No acute intracranial abnormality. Electronically Signed   By: Franky Crease M.D.   On: 11/16/2023 14:00   EKG: Sinus tachycardia, rate 120, QTc 450.  No significant ST or T wave abnormalities.  No prior  EKG to compare.  Assessment/Plan Principal Problem:   TIA (transient ischemic attack)   Assessment and Plan: * TIA (transient ischemic attack) Transient episode of left facial droop, slurred speech that lasted about 1 minute and spontaneously resolved in the ED.  Now back to baseline.  Has also had worsening headaches over the past couple of days.  No history of stroke or strokelike symptoms.  She is on Plavix  for what appears to be carotid artery disease and reports compliance (aspirin allergy).  Head CT negative for acute abnormality.   - MRI brain W Wo contrast-no acute abnormality, shows chronic ischemic changes and parenchymal volume loss. - EDP spoke with oncology Dr Davonna-  patient's oncologic medications do have prothrombotic characteristics, specifically veg F inhibitor > recommend admission for stroke workup - Spoke with neurology, patient can stay at [facility] for TIA evaluation - Carotid  Dopplers -Echocardiogram -PT, speech therapy eval -Lipid panel, HgbA1c - Resume atorvastatin  40 mg daily and Plavix   (aspirin allergy-reports hives) -IV morphine  4 mg every 4 hours as needed for headache  Acute metabolic encephalopathy 2/2 UTI-disorientation and confusion improved today, also headaches of 2 days duration, and dysuria.  Ruled out for sepsis.  Tachycardic heart rate 98-117 otherwise afebrile, WBC 7.4.  Also reports vomiting.  Possible dehydration also.  Urine cultures 9/10-Serratia marcescens status sensitive to ceftriaxone  - IV ceftriaxone  1 g daily - 1 L bolus given. Cont N/s 100cc/hr x 15hrs  Left ovarian cancer-follow-up with Dr. Rogers, last therapy session 9/10.  Status post exploratory laparotomy, bilateral salpingo-oophorectomy and infra gastric omentectomy.  Diabetes mellitus-  - SSI- S - HgbA1c - She is on insulin  Lantus 35 units daily, resume at 20 units - Hold Actos  Hypertension - Stable, resume losartan   DVT prophylaxis: Lovenox  Code Status: FULL code-family patient, son Nancyann and daughter-in-law at bedside Family Communication: Patient's son Nancyann and his spouse at bedside. Disposition Plan: ~ 2 days Consults called: None Admission status:  Obs tele    Author: Tully FORBES Carwin, MD 11/16/2023 6:35 PM  For on call review [email or web address].

## 2023-11-17 ENCOUNTER — Encounter (HOSPITAL_COMMUNITY): Payer: Self-pay | Admitting: Internal Medicine

## 2023-11-17 ENCOUNTER — Encounter: Payer: Self-pay | Admitting: Oncology

## 2023-11-17 ENCOUNTER — Observation Stay (HOSPITAL_COMMUNITY)

## 2023-11-17 ENCOUNTER — Other Ambulatory Visit (HOSPITAL_COMMUNITY): Payer: Self-pay | Admitting: *Deleted

## 2023-11-17 DIAGNOSIS — G459 Transient cerebral ischemic attack, unspecified: Secondary | ICD-10-CM

## 2023-11-17 DIAGNOSIS — R569 Unspecified convulsions: Secondary | ICD-10-CM

## 2023-11-17 LAB — GLUCOSE, CAPILLARY
Glucose-Capillary: 195 mg/dL — ABNORMAL HIGH (ref 70–99)
Glucose-Capillary: 233 mg/dL — ABNORMAL HIGH (ref 70–99)
Glucose-Capillary: 187 mg/dL — ABNORMAL HIGH (ref 70–99)

## 2023-11-17 LAB — LIPID PANEL
Cholesterol: 190 mg/dL (ref 0–200)
HDL: 54 mg/dL (ref >40)
LDL Cholesterol: 113 mg/dL — ABNORMAL HIGH (ref 0–99)
Total CHOL/HDL Ratio: 3.5 ratio
Triglycerides: 113 mg/dL (ref <150)
VLDL: 23 mg/dL (ref 0–40)

## 2023-11-17 LAB — ECHOCARDIOGRAM COMPLETE
AR max vel: 1.41 cm2
AV Area VTI: 1.5 cm2
AV Area mean vel: 1.6 cm2
AV Mean grad: 7.1 mmHg
AV Peak grad: 14.7 mmHg
Ao pk vel: 1.92 m/s
Area-P 1/2: 5.84 cm2
Height: 64 in
S' Lateral: 3 cm
Weight: 3107.6 [oz_av]

## 2023-11-17 LAB — HEMOGLOBIN A1C
Hgb A1c MFr Bld: 4.9 % (ref 4.8–5.6)
Mean Plasma Glucose: 93.93 mg/dL

## 2023-11-17 MED ORDER — ROSUVASTATIN CALCIUM 20 MG PO TABS: 20.0000 mg | ORAL_TABLET | Freq: Every day | ORAL | 2 refills | Status: AC

## 2023-11-17 MED ORDER — CIPROFLOXACIN HCL 250 MG PO TABS: 250.0000 mg | ORAL_TABLET | Freq: Two times a day (BID) | ORAL | 0 refills | Status: AC

## 2023-11-17 NOTE — Evaluation (Signed)
 Speech Language Pathology Evaluation Patient Details Name: Rebecca [name] MRN: [id number] DOB: June 14, 1945 Today's Date: 11/17/2023 Time: 8954-8889 SLP Time Calculation (min) (ACUTE ONLY): 25 min  Problem List:  Patient Active Problem List   Diagnosis Date Noted   TIA (transient ischemic attack) 11/16/2023   Port-A-Cath in place 02/02/2021   Genetic testing 01/29/2021   Ovarian cancer on left (HCC) 01/07/2021   Decreased strength, endurance, and mobility 12/21/2020   Anemia associated with acute blood loss 12/20/2020   Hypomagnesemia 12/20/2020   Bilateral carotid artery disease (HCC) 12/17/2020   Elevated CA-125 12/17/2020   GERD (gastroesophageal reflux disease) 12/17/2020   Hypertension, essential 12/17/2020   Intra-abdominal and pelvic swelling, mass and lump, unspecified site 12/17/2020   Type 2 diabetes mellitus, without long-term current use of insulin  (HCC) 12/17/2020   Past Medical History:  Past Medical History:  Diagnosis Date   Anemia    CAD (coronary artery disease)    DM2 (diabetes mellitus, type 2) (HCC)    GERD (gastroesophageal reflux disease)    HTN (hypertension)    Ovarian ca (HCC)    Port-A-Cath in place 02/02/2021   Past Surgical History:  Past Surgical History:  Procedure Laterality Date   IR IMAGING GUIDED PORT INSERTION  02/08/2021   HPI:  Rebecca [name] is a 78 y.o. female with medical history significant for ovarian cancer, diabetes mellitus, coronary artery disease, hypertension.  Presented to the ED on 11/16/23 with c/o confusion and headache, with diagnosis of UTI.  Patient was driving on Sunday around from about 830 and became disoriented and confused.  She was trying to get to the [facility] gas station that she normally goes to and lost her way, driving around for about 2 hours.  She has been having headaches with her therapy, usually resolves with Tylenol , but over the past couple of days headache has been significantly worse.  She reported episodes of  pain with urination and she had urinalysis and urine cultures done 9/10 that grew Serratia marcescens. While in the ED triage, speech suddenly became slurred, and she had left facial droop.  This lasted for about 10 minutes and spontaneously resolved and patient was back to baseline. MRI indicated small remote infarct in R cerebellum and small remote infarcts in the bilateral caudate nuclei.  SLP consulted for speech/language cognitive assessment.   Assessment / Plan / Recommendation Clinical Impression  Pt administered [employer] Mental Status Examination (SLUMS) with an overall score obtained of 22/30 with a typical score on this assessment being 27/30.  Pt exhibited deficits in the areas of sustained attention with tasks such as simple calculation and digit recall backwards.  Pt was oriented x4 and was able to provide detailed medical information regarding past medical hx.  Speech was intelligible despite missing dentition during conversation.  No dysarthria noted. Awareness of deficits appeared [facility]. Decreased recall of new information during object recall after a time delay being 80% accurate, paragraph recall with 50% accuracy obtained and pt exhibiting difficulty with clock formation task as she did not complete accurately during assessment.  Pt denies hearing/vision deficits, but this cannot be ruled out as impacting overall function as pt required repetition of task directives and used self-repetition during assessment.  Pt reported prior 10th grade education and living alone at home with family/friends available prn.  Pt is likely at baseline level of functioning for cognition and does not require further ST at this time.  If cognition declines further, may consider a full cognitive assessment with  neurology prn.  Thank you for this consult.    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  Frequency and Duration  (evaluation only)         SLP Evaluation Cognition  Overall Cognitive Status: No family/caregiver present to determine baseline cognitive functioning Orientation Level: Oriented X4 Attention: Sustained Sustained Attention: Impaired Sustained Attention Impairment: Verbal complex;Functional complex Memory: Impaired Memory Impairment: Decreased recall of new information Awareness: Appears intact Problem Solving: Appears intact Safety/Judgment: Appears intact Comments: able to indicate needs at home (ie: mobility device, making lists, calendar, etc)       Comprehension  Auditory Comprehension Overall Auditory Comprehension: Appears within functional limits for tasks assessed Commands: Not tested Conversation: Simple Visual Recognition/Discrimination Discrimination: Not tested Reading Comprehension Reading Status: Not tested    Expression Expression Primary Mode of Expression: Verbal Verbal Expression Overall Verbal Expression: Appears within functional limits for tasks assessed Level of Generative/Spontaneous Verbalization: Conversation (min verbose) Naming: Not tested Pragmatics: No impairment Non-Verbal Means of Communication: Not applicable Written Expression Dominant Hand: Right Written Expression: Exceptions to [facility] Interfering Components: Attention   Oral / Motor  Oral Motor/Sensory Function Overall Oral Motor/Sensory Function: Within functional limits Motor Speech Overall Motor Speech: Appears within functional limits for tasks assessed Respiration: Within functional limits Phonation: Normal Resonance: Within functional limits Articulation: Within  functional limitis Intelligibility: Intelligible Motor Planning: Within functional limits Motor Speech Errors: Not applicable Interfering Components: Inadequate dentition            Pat Brianne Maina,M.S.,CCC-SLP 11/17/2023, 11:20 AM

## 2023-11-17 NOTE — Plan of Care (Signed)

## 2023-11-17 NOTE — Progress Notes (Signed)
   11/17/23 1313  TOC Brief Assessment  Insurance and Status Reviewed  Patient has primary care physician Yes  Home environment has been reviewed Single family home  Prior level of function: Independent  Prior/Current Home Services No current home services  Social Drivers of Health Review SDOH reviewed no interventions necessary  Readmission risk has been reviewed Yes  Transition of care needs no transition of care needs at this time    Inpatient Care Management Department (ICM) has reviewed patient and no ICM needs have been identified at this time. We will continue to monitor patient advancement through interdisciplinary progression rounds. If new patient transition needs arise, please place a ICM consult.

## 2023-11-17 NOTE — Discharge Summary (Signed)
 Physician Discharge Summary   Patient: Rebecca [name] MRN: [id number] DOB: Mar 18, 1945  Admit date:     11/16/2023  Discharge date: 11/17/23  Discharge Physician: Elgin Lam, MD   PCP: Kristine [name], NP   Recommendations at discharge:  PCP visit for hospital follow-up Neurology visit for hospital follow-up  Discharge Diagnoses: Principal Problem:   TIA (transient ischemic attack)  Resolved Problems:   * No resolved hospital problems. *  Hospital Course: Rebecca [name] is a 78 y.o. female with a history of ovarian cancer, diabetes mellitus type 2, CAD, hypertension.  Patient presented secondary to confusion in setting of recently diagnosed UTI, and found to have evidence of left sided facial droop with dysarthria concerning for stroke/TIA. Workup negative for acute stroke. Neurology consulted and recommended to continue Plavix  monotherapy. Statin changed from Lipitor to Crestor .  Assessment and Plan:  TIA Patient presented with symptoms of left facial drop and dysarthria with symptoms resolving after 10. Prior to admission, patient was on Lipitor 40 mg. Initial CT head significant for no acute intracranial abnormality. MRI confirms no acute stroke, but evidence of remote stroke. Patient continued on home Plavix  75 mg daily monotherapy secondary to allergy to Aspirin. LDL of 113. Hemoglobin A1C pending. Transthoracic Echocardiogram without evidence of atrial level shunt. Neurology recommendations for outpatient follow-up and Plavix  monotherapy. PT/OT recommendations for no follow-up. Patient discharged on Crestor  20 mg daily and Plavix  75 mg daily.  Acute metabolic encephalopathy Present on admission. Presumed secondary to associated UTI, diagnosed prior to admission.  Resolved.   UTI Urine culture from 9/10 significant for Serratia marcescens. Patient started empirically on Ceftriaxone  IV. -Continue Ceftriaxone  IV and treat for 3 days   Left ovarian cancer Patient follows  with medical oncology, Dr. [name], as an outpatient. She is s/p bilateral salpingo-oophorectomy and infragastric omentectomy.   Diabetes mellitus type 2 Well controlled based on hemoglobin A1C of 4.9%. Patient is managed on Soliqua as an outpatient. Patient started on SSI on admission. Resume home regimen.   Primary hypertension Stable. Continue losartan  on discharge.   Consultants: Neurology Procedures performed: Transthoracic Echocardiogram  Disposition: Home Diet recommendation: Cardiac and Carb modified diet   DISCHARGE MEDICATION: Allergies as of 11/17/2023       Reactions   Aspirin Hives   Mometasone Itching   Naproxen Sodium Itching   Penicillin G Swelling   Triamcinolone Itching   Codeine Palpitations   Ibuprofen Rash   Iodinated Contrast Media Itching, Rash        Medication List     STOP taking these medications    atorvastatin  40 MG tablet Commonly known as: LIPITOR       TAKE these medications    acetaminophen  325 MG tablet Commonly known as: TYLENOL  Take 3 tablets (975 mg total) by mouth every 6 (six) hours as needed for moderate pain. What changed: how much to take   B-D ULTRAFINE III SHORT PEN 31G X 8 MM Misc Generic drug: Insulin  Pen Needle SMARTSIG:1 Each SUB-Q Daily   baclofen 10 MG tablet Commonly known as: LIORESAL Take 10 mg by mouth 3 (three) times daily as needed for muscle spasms.   ciprofloxacin  250 MG tablet Commonly known as: Cipro  Take 1 tablet (250 mg total) by mouth 2 (two) times daily for 1 day. Start taking on: November 18, 2023   clopidogrel  75 MG tablet Commonly known as: PLAVIX  Take 75 mg by mouth daily.   diphenhydrAMINE -zinc acetate cream Commonly known as: BENADRYL  Apply 1 application topically 3 (three)  times daily as needed for itching.   docusate sodium  100 MG capsule Commonly known as: Colace Take 1 capsule (100 mg total) by mouth daily as needed for mild constipation.   ferrous sulfate 325 (65 FE)  MG tablet Take 325 mg by mouth daily.   furosemide  20 MG tablet Commonly known as: LASIX  Take 1 tablet (20 mg total) by mouth in the morning. What changed:  when to take this reasons to take this   lidocaine -prilocaine  cream Commonly known as: EMLA  Apply to affected area once   losartan  50 MG tablet Commonly known as: COZAAR  Take 50 mg by mouth at bedtime.   magnesium  oxide 400 (240 Mg) MG tablet Commonly known as: MAG-OX Take 1 tablet (400 mg total) by mouth 2 (two) times daily.   Misc. Devices Misc Please provide patient with 3 diabetic nutritional supplements per day.   pantoprazole  20 MG tablet Commonly known as: PROTONIX  Take 20 mg by mouth daily.   pioglitazone 15 MG tablet Commonly known as: ACTOS Take 15 mg by mouth daily.   predniSONE  50 MG tablet Commonly known as: DELTASONE  Take 13hrs, 7hrs and 1hr before port placementTake 13hrs, 7hrs and 1hr before procedure   REDNESS RELIEVER EYE DROPS OP Place 1 drop into both eyes daily as needed (redness).   rosuvastatin  20 MG tablet Commonly known as: Crestor  Take 1 tablet (20 mg total) by mouth daily.   Soliqua 100-33 UNT-MCG/ML Sopn Generic drug: Insulin  Glargine-Lixisenatide Inject 35 Units into the skin daily.   sulfamethoxazole -trimethoprim  800-160 MG tablet Commonly known as: BACTRIM  DS Take 1 tablet by mouth 2 (two) times daily.        Follow-up Information     [name], [name], NP. Schedule an appointment as soon as possible for a visit in 1 week(s).   Specialty: Nurse Practitioner Why: For hospital follow-up Contact information: [address] [place] [address] [phone]                Discharge Exam: BP (!) 119/55 (BP Location: Left Arm)   Pulse 99   Temp 98 F (36.7 C) (Oral)   Resp 20   Ht 5' 4 (1.626 m)   Wt 88.1 kg   SpO2 97%   BMI 33.34 kg/m   General exam: Appears calm and comfortable Respiratory system: Clear to auscultation. Respiratory  effort normal. Cardiovascular system: S1 & S2 heard, RRR. Gastrointestinal system: Abdomen is nondistended, soft and nontender. Normal bowel sounds heard. Central nervous system: Alert and oriented. No focal neurological deficits. Musculoskeletal: No edema. No calf tenderness Skin: No cyanosis. No rashes Psychiatry: Memory slightly impaired.  Condition at discharge: stable  The results of significant diagnostics from this hospitalization (including imaging, microbiology, ancillary and laboratory) are listed below for reference.   Imaging Studies: ECHOCARDIOGRAM COMPLETE Result Date: 11/17/2023    ECHOCARDIOGRAM REPORT   Patient Name:   MADDILYNN ESPERANZA Date of Exam: 11/17/2023 Medical Rec #:  [id number]     Height:       64.0 in Accession #:    [id number]    Weight:       194.2 lb Date of Birth:  Mar 06, 1945      BSA:          1.932 m Patient Age:    78 years      BP:           136/72 mmHg Patient Gender: F             HR:  95 bpm. Exam Location:  [facility] Procedure: 2D Echo, Cardiac Doppler and Color Doppler (Both Spectral and Color            Flow Doppler were utilized during procedure). Indications:    TIA G45.9  History:        Patient has prior history of Echocardiogram examinations, most                 recent 01/07/2023. CAD, TIA; Risk Factors:Hypertension and                 Diabetes. Ovarian cancer on left [facility]).  Sonographer:    Aida Pizza RCS Referring Phys: [phone] EJIROGHENE E EMOKPAE IMPRESSIONS  1. Left ventricular ejection fraction, by estimation, is 60 to 65%. The left ventricle has normal function. The left ventricle has no regional wall motion abnormalities. There is moderate left ventricular hypertrophy. Left ventricular diastolic parameters are indeterminate.  2. Right ventricular systolic function is normal. The right ventricular size is normal.  3. Left atrial size was mildly dilated.  4. The mitral valve is normal in structure. No evidence of mitral valve regurgitation. No  evidence of mitral stenosis. Moderate mitral annular calcification.  5. The aortic valve is tricuspid. There is mild calcification of the aortic valve. There is mild thickening of the aortic valve. Aortic valve regurgitation is not visualized. No aortic stenosis is present.  6. The inferior vena cava is normal in size with greater than 50% respiratory variability, suggesting right atrial pressure of 3 mmHg. FINDINGS  Left Ventricle: Left ventricular ejection fraction, by estimation, is 60 to 65%. The left ventricle has normal function. The left ventricle has no regional wall motion abnormalities. The left ventricular internal cavity size was normal in size. There is  moderate left ventricular hypertrophy. Left ventricular diastolic parameters are indeterminate. Right Ventricle: The right ventricular size is normal. Right vetricular wall thickness was not well visualized. Right ventricular systolic function is normal. Left Atrium: Left atrial size was mildly dilated. Right Atrium: Right atrial size was normal in size. Pericardium: There is no evidence of pericardial effusion. Mitral Valve: The mitral valve is normal in structure. There is mild thickening of the mitral valve leaflet(s). There is mild calcification of the mitral valve leaflet(s). Moderate mitral annular calcification. No evidence of mitral valve regurgitation. No evidence of mitral valve stenosis. Tricuspid Valve: The tricuspid valve is normal in structure. Tricuspid valve regurgitation is not demonstrated. No evidence of tricuspid stenosis. Aortic Valve: The aortic valve is tricuspid. There is mild calcification of the aortic valve. There is mild thickening of the aortic valve. There is mild aortic valve annular calcification. Aortic valve regurgitation is not visualized. No aortic stenosis  is present. Aortic valve mean gradient measures 7.1 mmHg. Aortic valve peak gradient measures 14.7 mmHg. Aortic valve area, by VTI measures 1.50 cm. Pulmonic  Valve: The pulmonic valve was not well visualized. Pulmonic valve regurgitation is not visualized. No evidence of pulmonic stenosis. Aorta: The aortic root is normal in size and structure. Venous: The inferior vena cava is normal in size with greater than 50% respiratory variability, suggesting right atrial pressure of 3 mmHg. IAS/Shunts: No atrial level shunt detected by color flow Doppler.  LEFT VENTRICLE PLAX 2D LVIDd:         4.30 cm LVIDs:         3.00 cm LV PW:         1.40 cm LV IVS:        1.40  cm LVOT diam:     1.70 cm LV SV:         56 LV SV Index:   29 LVOT Area:     2.27 cm  RIGHT VENTRICLE RV S prime:     13.30 cm/s TAPSE (M-mode): 2.0 cm LEFT ATRIUM             Index        RIGHT ATRIUM           Index LA diam:        3.35 cm 1.73 cm/m   RA Area:     12.40 cm LA Vol (A2C):   77.2 ml 39.96 ml/m  RA Volume:   25.40 ml  13.15 ml/m LA Vol (A4C):   63.0 ml 32.61 ml/m LA Biplane Vol: 71.9 ml 37.21 ml/m  AORTIC VALVE AV Area (Vmax):    1.41 cm AV Area (Vmean):   1.60 cm AV Area (VTI):     1.50 cm AV Vmax:           191.86 cm/s AV Vmean:          122.241 cm/s AV VTI:            0.374 m AV Peak Grad:      14.7 mmHg AV Mean Grad:      7.1 mmHg LVOT Vmax:         119.00 cm/s LVOT Vmean:        86.000 cm/s LVOT VTI:          0.247 m LVOT/AV VTI ratio: 0.66  AORTA Ao Root diam: 3.70 cm MITRAL VALVE MV Area (PHT): 5.84 cm     SHUNTS MV Decel Time: 130 msec     Systemic VTI:  0.25 m MV E velocity: 148.00 cm/s  Systemic Diam: 1.70 cm Dorn Ross MD Electronically signed by Dorn Ross MD Signature Date/Time: 11/17/2023/1:07:18 PM    Final    US  Carotid Bilateral (at [facility] and [facility] only) Result Date: 11/17/2023 CLINICAL DATA:  78 year old female with history transient ischemic attack. EXAM: BILATERAL CAROTID DUPLEX ULTRASOUND TECHNIQUE: Elnor scale imaging, color Doppler and duplex ultrasound were performed of bilateral carotid and vertebral arteries in the neck. COMPARISON:  None Available. FINDINGS:  Criteria: Quantification of carotid stenosis is based on velocity parameters that correlate the residual internal carotid diameter with NASCET-based stenosis levels, using the diameter of the distal internal carotid lumen as the denominator for stenosis measurement. The following velocity measurements were obtained: RIGHT ICA: Peak systolic velocity 84 cm/sec, End diastolic velocity 17 cm/sec CCA: Peak systolic velocity 67 cm/sec SYSTOLIC ICA/CCA RATIO:  1.2 ECA: Peak systolic velocity 97 cm/sec LEFT ICA: Peak systolic velocity 114 cm/sec, End diastolic velocity 21 cm/sec CCA: 99 cm/sec SYSTOLIC ICA/CCA RATIO:  1.2 ECA: 121 cm/sec RIGHT CAROTID ARTERY: Mild multifocal atherosclerotic plaque formation. No significant tortuosity. Normal low resistance waveforms. RIGHT VERTEBRAL ARTERY:  Antegrade flow. LEFT CAROTID ARTERY: Mild multifocal atherosclerotic plaque formation. No significant tortuosity. Normal low resistance waveforms. LEFT VERTEBRAL ARTERY:  Antegrade flow. Upper extremity non-invasive blood pressures: Not obtained. IMPRESSION: 1. Right carotid artery system: Less than 50% stenosis secondary to atherosclerotic plaque formation. 2. Left carotid artery system: Less than 50% stenosis secondary to atherosclerotic plaque formation. 3.  Vertebral artery system: Patent with antegrade flow bilaterally. Ester Sides, MD [facility] Electronically Signed   By: Ester Sides M.D.   On: 11/17/2023 10:25   MR Brain W and Wo  Contrast Result Date: 11/16/2023 EXAM: MRI BRAIN WITH AND WITHOUT CONTRAST 11/16/2023 02:46:27 PM TECHNIQUE: Multiplanar multisequence MRI of the head/brain was performed with and without the administration of intravenous contrast. COMPARISON: Same day CT head. CLINICAL HISTORY: Headache, new onset (Age >= 51y); headache, transient disorientation and ovarian CA history. Incomplete exam, Headache; Uncooperative, multiple attempts at scan,  refused to continue after injection, ts coronal t1 vibe post. FINDINGS: BRAIN AND VENTRICLES: Mild T2/FLAIR hyperintensity in the periventricular and subcortical white matter, suggestive of mild chronic microvascular ischemic changes. Mild parenchymal volume loss. 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 No acute infarct. No acute intracranial hemorrhage. No mass effect or midline shift. No hydrocephalus. The sella is unremarkable. Normal flow voids. No mass or abnormal enhancement. Post-contrast images are limited by motion artifact; within these limitations, there is no evidence of abnormal enhancement. ORBITS: No acute abnormality. SINUSES: Mucosal thickening in the left maxillary sinus. No acute abnormality. BONES AND SOFT TISSUES: Normal bone marrow signal and enhancement. No acute soft tissue abnormality. IMPRESSION: 1. No acute intracranial abnormality. 2. Mild chronic microvascular ischemic changes and mild parenchymal volume loss. 3. 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 Electronically signed by: Donnice Mania MD 11/16/2023 03:24 PM EDT RP Workstation: [id number]   CT Head Wo Contrast Result Date: 11/16/2023 CLINICAL DATA:  headahce, trainsient disorientation and ovarian CA hx. EXAM: CT HEAD WITHOUT CONTRAST TECHNIQUE: Contiguous axial images were obtained from the base of the skull through the vertex without intravenous contrast. RADIATION DOSE REDUCTION: This exam was performed according to the departmental dose-optimization program which includes automated exposure control, adjustment of the mA and/or kV according to patient size and/or use of iterative reconstruction technique. COMPARISON:  None Available. 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 No acute intracranial abnormality. Specifically, no hemorrhage, hydrocephalus, mass  lesion, acute infarction, or significant intracranial injury. Vascular: No hyperdense vessel or unexpected calcification. Skull: No acute calvarial abnormality. 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 No acute intracranial abnormality. Electronically Signed   By: Franky Crease M.D.   On: 11/16/2023 14:00    Microbiology: Results for orders placed or performed in visit on 11/11/23  Urine culture     Status: Abnormal   Collection Time: 11/11/23  1:24 PM   Specimen: Urine, Clean Catch  Result Value Ref Range Status   Specimen Description   Final    URINE, CLEAN CATCH Performed at [facility], [address] [place], [place] [address]    Special Requests   Final    NONE Performed at [facility], [address], [place], [place] [address]    Culture 70,000 COLONIES/mL SERRATIA MARCESCENS (A)  Final   Report Status 11/13/2023 FINAL  Final   Organism ID, Bacteria SERRATIA MARCESCENS (A)  Final      Susceptibility   Serratia marcescens - MIC*    CEFEPIME <=0.12 SENSITIVE Sensitive     ERTAPENEM <=0.12 SENSITIVE Sensitive     CEFTRIAXONE  <=0.25 SENSITIVE Sensitive     CIPROFLOXACIN  0.12 SENSITIVE Sensitive     GENTAMICIN <=1 SENSITIVE Sensitive     NITROFURANTOIN  256 RESISTANT Resistant     TRIMETH /SULFA  <=20 SENSITIVE Sensitive     MEROPENEM <=0.25 SENSITIVE Sensitive     * 70,000 COLONIES/mL SERRATIA MARCESCENS    Labs: CBC: Recent Labs  Lab 11/11/23  1322 11/16/23 1311  WBC 7.5 7.4  NEUTROABS 4.4 4.3  HGB 10.7* 11.3*  HCT 32.5* 33.3*  MCV 94.2 93.0  PLT 232 223   Basic Metabolic Panel: Recent Labs  Lab 11/11/23 1322 11/16/23 1311  NA 138 137  K 3.9 4.0  CL 101 100  CO2 24 22  GLUCOSE 73 99  BUN 15 16  CREATININE 1.13* 1.19*  CALCIUM  9.3 9.3  MG 1.8  --    Liver Function Tests: Recent Labs  Lab 11/11/23 1322  AST 16  ALT 17  ALKPHOS 86  BILITOT 0.5  PROT 7.2  ALBUMIN 3.6    CBG: Recent Labs  Lab 11/16/23 1243 11/16/23 2006 11/17/23 0721 11/17/23 1111  GLUCAP 91 251* 195* 233*    Discharge time spent:  35 minutes.  Signed: Elgin Lam, MD [facility] 11/17/2023

## 2023-11-17 NOTE — Evaluation (Signed)
 Physical Therapy Evaluation Patient Details Name: Rebecca [name] MRN: [id number] DOB: 01-07-1946 Today's Date: 11/17/2023  History of Present Illness  Rebecca [name] is a 78 y.o. female with medical history significant for ovarian cancer, diabetes mellitus, coronary artery disease, hypertension.  Presented to the ED with complaints of confusion and headaches, with diagnosis of UTI.  Yesterday, patient was driving around from about 830 disoriented and confused till she was contacted at about 10 PM by family.  She was trying to get to the [employer] [facility] gas station that she normally goes to and lost her way, driving around for about 2 hours.  She has been having headaches with her therapy, usually resolves with Tylenol , but over the past couple of days headache has been significantly worse.  Yesterday she vomited about 5 times once today.  No abdominal pain.  No diarrhea.  She reports episodes of pain with urination and she had urinalysis and urine cultures done 9/10 that grew Serratia marcescens.   Clinical Impression  Pt was agreeable to PT and OT co evaluation. She appears to be near baseline with her functional mobility. She exhibited no significant lower extremity strength deficits. She was able to safely ambulate in the hall without the need for an assistive device. No further skilled PT is recommended at this time. Patient discharged to care of nursing for ambulation daily as tolerated for length of stay.       If plan is discharge home, recommend the following:     Can travel by private vehicle        Equipment Recommendations None recommended by PT  Recommendations for Other Services       Functional Status Assessment Patient has not had a recent decline in their functional status     Precautions / Restrictions Precautions Precautions: Fall Recall of Precautions/Restrictions: Intact Restrictions Weight Bearing Restrictions Per Provider Order: No      Mobility  Bed Mobility Overal bed  mobility: Independent                  Transfers Overall transfer level: Independent                      Ambulation/Gait Ambulation/Gait assistance: Independent Gait Distance (Feet): 150 Feet Assistive device: None Gait Pattern/deviations: WFL(Within Functional Limits) Gait velocity: WFL     General Gait Details: no significant gait deviations observed  Stairs            Wheelchair Mobility     Tilt Bed    Modified Rankin (Stroke Patients Only)       Balance Overall balance assessment: Mild deficits observed, not formally tested                                           Pertinent Vitals/Pain Pain Assessment Pain Assessment: 0-10 Pain Score: 8  Pain Location: head Pain Descriptors / Indicators: Headache Pain Intervention(s): Monitored during session    Home Living Family/patient expects to be discharged to:: Private residence Living Arrangements: Alone Available Help at Discharge: Family;Available PRN/intermittently Type of Home: House Home Access: Stairs to enter Entrance Stairs-Rails: Left;Right;Can reach both Entrance Stairs-Number of Steps: 4-5   Home Layout: One level Home Equipment: Grab bars - tub/shower;Shower seat      Prior Function Prior Level of Function : Independent/Modified Independent  Mobility Comments: Community ambulator without AD; drives ADLs Comments: Independent     Extremity/Trunk Assessment   Upper Extremity Assessment Upper Extremity Assessment: Defer to OT evaluation LUE Deficits / Details: 4+/5 shoulder flexion and abduction. Pt not concerned about mild L shoulder weekness.    Lower Extremity Assessment Lower Extremity Assessment: Overall WFL for tasks assessed    Cervical / Trunk Assessment Cervical / Trunk Assessment: Normal  Communication   Communication Communication: No apparent difficulties    Cognition Arousal: Alert Behavior During Therapy: WFL for  tasks assessed/performed   PT - Cognitive impairments: No apparent impairments                       PT - Cognition Comments: unremarkable Following commands: Intact       Cueing Cueing Techniques: Verbal cues     General Comments      Exercises     Assessment/Plan    PT Assessment Patient does not need any further PT services  PT Problem List         PT Treatment Interventions      PT Goals (Current goals can be found in the Care Plan section)       Frequency       Co-evaluation PT/OT/SLP Co-Evaluation/Treatment: Yes Reason for Co-Treatment: To address functional/ADL transfers PT goals addressed during session: Mobility/safety with mobility;Balance OT goals addressed during session: ADL's and self-care       AM-PAC PT 6 Clicks Mobility  Outcome Measure Help needed turning from your back to your side while in a flat bed without using bedrails?: None Help needed moving from lying on your back to sitting on the side of a flat bed without using bedrails?: None Help needed moving to and from a bed to a chair (including a wheelchair)?: None Help needed standing up from a chair using your arms (e.g., wheelchair or bedside chair)?: None Help needed to walk in hospital room?: None Help needed climbing 3-5 steps with a railing? : A Little 6 Click Score: 23    End of Session   Activity Tolerance: Patient tolerated treatment well Patient left: in chair;with call bell/phone within reach   PT Visit Diagnosis: Difficulty in walking, not elsewhere classified (R26.2);Muscle weakness (generalized) (M62.81);Unsteadiness on feet (R26.81)    Time: 0811-0826 PT Time Calculation (min) (ACUTE ONLY): 15 min   Charges:   PT Evaluation $PT Eval Low Complexity: 1 Low PT Treatments $Therapeutic Activity: 8-22 mins PT General Charges $$ ACUTE PT VISIT: 1 Visit         Lacinda Fass, PT, DPT  11/17/2023, 1:42 PM

## 2023-11-17 NOTE — Care Management Obs Status (Signed)
 MEDICARE OBSERVATION STATUS NOTIFICATION   Patient Details  Name: Rebecca [name] MRN: [id number] Date of Birth: February 27, 1946   Medicare Observation Status Notification Given:  Yes    Duwaine LITTIE Ada 11/17/2023, 3:54 PM

## 2023-11-17 NOTE — Discharge Instructions (Signed)
 Rebecca [name],  You were in the hospital with a concern for stroke and found to have a TIA. Thankfully, no lasting new stroke was noted on your MRI, but you do have evidence of prior strokes. You were also treated for a UTI. Please take your medication as prescribed.

## 2023-11-17 NOTE — Progress Notes (Signed)
*  PRELIMINARY RESULTS* Echocardiogram 2D Echocardiogram has been performed.  Rebecca [name] 11/17/2023, 11:41 AM

## 2023-11-17 NOTE — Evaluation (Signed)
 Occupational Therapy Evaluation Patient Details Name: Rebecca [name] MRN: [id number] DOB: 03-Feb-1946 Today's Date: 11/17/2023   History of Present Illness   Rebecca [name] is a 78 y.o. female with medical history significant for ovarian cancer, diabetes mellitus, coronary artery disease, hypertension.  Presented to the ED with complaints of confusion and headaches, with diagnosis of UTI.  Yesterday, patient was driving around from about 830 disoriented and confused till she was contacted at about 10 PM by family.  She was trying to get to the [facility] gas station that she normally goes to and lost her way, driving around for about 2 hours.  She has been having headaches with her therapy, usually resolves with Tylenol , but over the past couple of days headache has been significantly worse.  Yesterday she vomited about 5 times once today.  No abdominal pain.  No diarrhea.  She reports episodes of pain with urination and she had urinalysis and urine cultures done 9/10 that grew Serratia marcescens. (per MD)     Clinical Impressions Pt agreeable to OT and PT co-evaluation. Pt appears to be near baseline function for ADL's and mobility. Mild L shoulder weakness but pt was not concerned and did not seem to notice any weakness. Pt able to complete ADL's independently based on observation. Pt ambulated in the hall without AD. Pt is not recommended for any further acute OT services and will be discharged to care of nursing staff for remaining length of stay.               Functional Status Assessment   Patient has not had a recent decline in their functional status     Equipment Recommendations   None recommended by OT             Precautions/Restrictions   Precautions Precautions: Fall Recall of Precautions/Restrictions: Intact Restrictions Weight Bearing Restrictions Per Provider Order: No     Mobility Bed Mobility Overal bed mobility: Independent                   Transfers Overall transfer level: Independent                        Balance Overall balance assessment: Mild deficits observed, not formally tested                                         ADL either performed or assessed with clinical judgement   ADL Overall ADL's : Independent                                       General ADL Comments: Donned socks and ambualted in the hall independently.     Vision Baseline Vision/History: 0 No visual deficits Ability to See in Adequate Light: 0 Adequate Patient Visual Report: No change from baseline Vision Assessment?: No apparent visual deficits     Perception Perception: Not tested       Praxis Praxis: Not tested       Pertinent Vitals/Pain Pain Assessment Pain Assessment: 0-10 (Simultaneous filing. User may not have seen previous data.) Pain Score: 8  Pain Location: head Pain Descriptors / Indicators: Headache Pain Intervention(s): Monitored during session     Extremity/Trunk Assessment Upper Extremity Assessment Upper Extremity Assessment: LUE deficits/detail;Overall [facility] for tasks  assessed LUE Deficits / Details: 4+/5 shoulder flexion and abduction. Pt not concerned about mild L shoulder weekness.   Lower Extremity Assessment Lower Extremity Assessment: Defer to PT evaluation   Cervical / Trunk Assessment Cervical / Trunk Assessment: Normal   Communication Communication Communication: No apparent difficulties   Cognition Arousal: Alert Behavior During Therapy: WFL for tasks assessed/performed Cognition: No apparent impairments                               Following commands: Intact       Cueing  General Comments   Cueing Techniques: Verbal cues                 Home Living Family/patient expects to be discharged to:: Private residence Living Arrangements: Alone Available Help at Discharge: Family;Available PRN/intermittently (Simultaneous  filing. User may not have seen previous data.) Type of Home: House Home Access: Stairs to enter [employer] of Steps: 4-5 Entrance Stairs-Rails: Left;Right;Can reach both Home Layout: One level     Bathroom Shower/Tub: Walk-in shower;Tub/shower unit   Bathroom Toilet: Standard Bathroom Accessibility: Yes How Accessible: Accessible via wheelchair;Accessible via walker Home Equipment: Grab bars - tub/shower;Shower seat      Lives With: Alone    Prior Functioning/Environment Prior Level of Function : Independent/Modified Independent             Mobility Comments: Community ambulator without AD; drives ADLs Comments: Independent                            Co-evaluation PT/OT/SLP Co-Evaluation/Treatment: Yes Reason for Co-Treatment: To address functional/ADL transfers   OT goals addressed during session: ADL's and self-care      AM-PAC OT 6 Clicks Daily Activity     Outcome Measure Help from another person eating meals?: None Help from another person taking care of personal grooming?: None Help from another person toileting, which includes using toliet, bedpan, or urinal?: None Help from another person bathing (including washing, rinsing, drying)?: None Help from another person to put on and taking off regular upper body clothing?: None Help from another person to put on and taking off regular lower body clothing?: None 6 Click Score: 24   End of Session    Activity Tolerance: Patient tolerated treatment well Patient left: in bed;with call bell/phone within reach  OT Visit Diagnosis: Other symptoms and signs involving the nervous system (R29.898);Muscle weakness (generalized) (M62.81)                Time: 9189-9176 OT Time Calculation (min): 13 min Charges:  OT General Charges $OT Visit: 1 Visit OT Evaluation $OT Eval Low Complexity: 1 Low  Nolyn Eilert OT, MOT  Jayson Person 11/17/2023, 11:33 AM

## 2023-11-17 NOTE — Hospital Course (Signed)
 Rebecca [name] is a 78 y.o. female with a history of ovarian cancer, diabetes mellitus type 2, CAD, hypertension.  Patient presented secondary to confusion in setting of recently diagnosed UTI, and found to have evidence of left sided facial droop with dysarthria concerning for stroke/TIA. Workup negative for acute stroke. Neurology consulted and recommended to continue Plavix  monotherapy. Statin changed from Lipitor to Crestor .

## 2023-11-17 NOTE — Plan of Care (Signed)
 Problem: Education: Goal: Knowledge of General Education information will improve Description: Including pain rating scale, medication(s)/side effects and non-pharmacologic comfort measures Outcome: Adequate for Discharge   Problem: Health Behavior/Discharge Planning: Goal: Ability to manage health-related needs will improve Outcome: Adequate for Discharge   Problem: Clinical Measurements: Goal: Ability to maintain clinical measurements within normal limits will improve Outcome: Adequate for Discharge Goal: Will remain free from infection Outcome: Adequate for Discharge Goal: Diagnostic test results will improve Outcome: Adequate for Discharge Goal: Respiratory complications will improve Outcome: Adequate for Discharge Goal: Cardiovascular complication will be avoided Outcome: Adequate for Discharge   Problem: Activity: Goal: Risk for activity intolerance will decrease Outcome: Adequate for Discharge   Problem: Nutrition: Goal: Adequate nutrition will be maintained Outcome: Adequate for Discharge   Problem: Coping: Goal: Level of anxiety will decrease Outcome: Adequate for Discharge   Problem: Elimination: Goal: Will not experience complications related to bowel motility Outcome: Adequate for Discharge Goal: Will not experience complications related to urinary retention Outcome: Adequate for Discharge   Problem: Pain Managment: Goal: General experience of comfort will improve and/or be controlled Outcome: Adequate for Discharge   Problem: Safety: Goal: Ability to remain free from injury will improve Outcome: Adequate for Discharge   Problem: Skin Integrity: Goal: Risk for impaired skin integrity will decrease Outcome: Adequate for Discharge   Problem: Education: Goal: Ability to describe self-care measures that may prevent or decrease complications (Diabetes Survival Skills Education) will improve Outcome: Adequate for Discharge Goal: Individualized Educational  Video(s) Outcome: Adequate for Discharge   Problem: Coping: Goal: Ability to adjust to condition or change in health will improve Outcome: Adequate for Discharge   Problem: Fluid Volume: Goal: Ability to maintain a balanced intake and output will improve Outcome: Adequate for Discharge   Problem: Health Behavior/Discharge Planning: Goal: Ability to identify and utilize available resources and services will improve Outcome: Adequate for Discharge Goal: Ability to manage health-related needs will improve Outcome: Adequate for Discharge   Problem: Metabolic: Goal: Ability to maintain appropriate glucose levels will improve Outcome: Adequate for Discharge   Problem: Nutritional: Goal: Maintenance of adequate nutrition will improve Outcome: Adequate for Discharge Goal: Progress toward achieving an optimal weight will improve Outcome: Adequate for Discharge   Problem: Skin Integrity: Goal: Risk for impaired skin integrity will decrease Outcome: Adequate for Discharge   Problem: Tissue Perfusion: Goal: Adequacy of tissue perfusion will improve Outcome: Adequate for Discharge   Problem: Education: Goal: Knowledge of disease or condition will improve Outcome: Adequate for Discharge Goal: Knowledge of secondary prevention will improve (MUST DOCUMENT ALL) Outcome: Adequate for Discharge Goal: Knowledge of patient specific risk factors will improve (DELETE if not current risk factor) Outcome: Adequate for Discharge   Problem: Ischemic Stroke/TIA Tissue Perfusion: Goal: Complications of ischemic stroke/TIA will be minimized Outcome: Adequate for Discharge   Problem: Coping: Goal: Will verbalize positive feelings about self Outcome: Adequate for Discharge Goal: Will identify appropriate support needs Outcome: Adequate for Discharge   Problem: Health Behavior/Discharge Planning: Goal: Ability to manage health-related needs will improve Outcome: Adequate for Discharge Goal:  Goals will be collaboratively established with patient/family Outcome: Adequate for Discharge   Problem: Self-Care: Goal: Ability to participate in self-care as condition permits will improve Outcome: Adequate for Discharge Goal: Verbalization of feelings and concerns over difficulty with self-care will improve Outcome: Adequate for Discharge Goal: Ability to communicate needs accurately will improve Outcome: Adequate for Discharge   Problem: Nutrition: Goal: Risk of aspiration will decrease Outcome: Adequate for Discharge  Goal: Dietary intake will improve Outcome: Adequate for Discharge

## 2023-11-17 NOTE — Consult Note (Signed)
 I connected with  Rebecca [name] on 11/17/23 by a video enabled telemedicine application and verified that I am speaking with the correct person using two identifiers.   I discussed the limitations of evaluation and management by telemedicine. The patient expressed understanding and agreed to proceed.  Location of patient: [facility] Location of physician: [facility]   Neurology Consultation Reason for Consult: ams Referring Physician: Dr Burgess Fanti  CC: ams  History is obtained from: patient, chart review  HPI: Rebecca [name] is a 78 y.o. female with h/o ovarian cancer on chemo, HTN, DM and CAD who was brought in for an episode of confusion. Per patient she was driving and was taking to someone on phone so missed [employer] station a few times and nothing is wrong with her. However per chart review, family noticed she was lost for about 2 hours and couldn't find the [place] gas station she always goes to. Oncology office then called stating patient might have a UTI. Therefore family brought her to ER. In waiting room she was noted to gave some facial droop and slurring for about a minute. Patient states she knew what to say but was overwhelmed from so many people asking her questions.   Patient is on plavix  daily.   ROS: All other systems reviewed and negative except as noted in the HPI.  Past Medical History:  Diagnosis Date   Anemia    CAD (coronary artery disease)    DM2 (diabetes mellitus, type 2) (HCC)    GERD (gastroesophageal reflux disease)    HTN (hypertension)    Ovarian ca (HCC)    Port-A-Cath in place 02/02/2021    History reviewed. No pertinent family history.  Social History:  reports that she does not currently use alcohol. She reports that she does not use drugs. No history on file for tobacco use.   No medications prior to admission.      Exam: Current vital signs: BP (!) 119/55 (BP Location: Left Arm)   Pulse 99   Temp 98 F (36.7 C) (Oral)   Resp  20   Ht 5' 4 (1.626 m)   Wt 88.1 kg   SpO2 97%   BMI 33.34 kg/m  Vital signs in last 24 hours: Temp:  [97.6 F (36.4 C)-99.1 F (37.3 C)] 98 F (36.7 C) (09/16 1212) Pulse Rate:  [88-131] 99 (09/16 1212) Resp:  [19-22] 20 (09/16 0356) BP: (119-147)/(55-83) 119/55 (09/16 1212) SpO2:  [91 %-97 %] 97 % (09/16 1212) Weight:  [88.1 kg] 88.1 kg (09/15 1952)   Physical Exam  Constitutional: Appears well-developed and well-nourished.  Psych: Affect appropriate to situation Neuro: AOX3, no aphasia, CN grossly intact, antigravity strength in all extremities, FTN intact, sensory intact to light touch  I have reviewed labs in epic and the results pertinent to this consultation are: CBC:  Recent Labs  Lab 11/11/23 1322 11/16/23 1311  WBC 7.5 7.4  NEUTROABS 4.4 4.3  HGB 10.7* 11.3*  HCT 32.5* 33.3*  MCV 94.2 93.0  PLT 232 223    Basic Metabolic Panel:  Lab Results  Component Value Date   NA 137 11/16/2023   K 4.0 11/16/2023   CO2 22 11/16/2023   GLUCOSE 99 11/16/2023   BUN 16 11/16/2023   CREATININE 1.19 (H) 11/16/2023   CALCIUM  9.3 11/16/2023   GFRNONAA 47 (L) 11/16/2023   Lipid Panel:  Lab Results  Component Value Date   LDLCALC 113 (H) 11/17/2023   HgbA1c:  Lab Results  Component Value Date   HGBA1C 4.9 11/17/2023   Urine Drug Screen: No results found for: LABOPIA, COCAINSCRNUR, LABBENZ, AMPHETMU, THCU, LABBARB  Alcohol Level     Component Value Date/Time   ETH <15 11/16/2023 1311     I have reviewed the images obtained:   CT Head without contrast 11/16/2023: Mild chronic small vessel disease throughout the deep white matter. No acute intracranial abnormality.  MRI Brain w and wo contrast 11/16/2023: 1. No acute intracranial abnormality. Mild chronic microvascular ischemic changes and mild parenchymal volume loss. 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  Carotid US  11/17/2023:   Right carotid artery system: Less than 50% stenosis secondary to atherosclerotic plaque formation.  Left carotid artery system: Less than 50% stenosis secondary to atherosclerotic plaque formation. Vertebral artery system: Patent with antegrade flow bilaterally.  TTE 11/17/2023: no thrombus  ASSESSMENT/PLAN: 78yo F with transient confusion.   Transient alteration of awareness - ddx include TIA vs less likely seizure - Recommend MRA head wo contrast to look for intracranial stenosis. - Continue Plavix  75mg  daily - if above workup negative can consider eeg. Wouldn't start meds unless eeg abnormal - PT/OT -Goal BP: Normotension   Addendum - patient refusing any more studies and wants to go home  Thank you for allowing us  to participate in the care of this patient. If you have any further questions, please contact  me or neurohospitalist.   Arlin Krebs Epile[facility] neurohospitalist

## 2023-11-17 NOTE — Progress Notes (Signed)
   11/17/23 1129  Spiritual Encounters  Type of Visit Initial  Care provided to: Patient  Referral source Clinical staff  Reason for visit Routine spiritual support  OnCall Visit No  Spiritual Framework  Presenting Themes Meaning/purpose/sources of inspiration;Goals in life/care;Values and beliefs  Community/Connection Family;Friend(s);Faith community  Patient Stress Factors Health changes;Major life changes  Interventions  Spiritual Care Interventions Made Established relationship of care and support;Compassionate presence;Reflective listening;Prayer;Encouragement  Intervention Outcomes  Outcomes Connection to spiritual care;Awareness of support;Connection to values and goals of care   Reason for Visit: Chaplain received referral from Clinical Staff that one of our cancer center patients was inpatient and suggested a visit.   Description of Visit: Upon entering the room, Pt was awake and alert and willing to speak with me.   Anaise spoke with me about her cancer being back and having to fight it again.  She presents as having a strong desire to fight, and a strong belief that her higher power will accomplish a miracle for her and remover her cancer.  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  She also appears to have family and friends around her for support and care.  She seems to have a clear understanding of the seriousness of her cancer, and she chooses to face it with her faith.  Melissa expresses appreciation for the added support and states that she will be looking for me on the floor at the clinic when she comes.  Plan of Care: I will continue to follow up with this Pt through his discharge and then any care from the Cancer Center.   Maude Roll, MDiv  Chaplain, [facility] [email or web address] [phone]

## 2023-12-01 ENCOUNTER — Encounter: Payer: Self-pay | Admitting: Oncology

## 2023-12-01 NOTE — Progress Notes (Signed)
 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 female was transferred to my care after her prior physician has left.   ASSESSMENT & PLAN:   Assessment & Plan: Rebecca [name]  is a 78 y.o. female with high-grade serous ovarian carcinoma   Assessment and Plan Assessment & Plan Recurrent high-grade serous ovarian carcinoma.   Extensive oncology history below Patient completed 6 cycles of carboplatin  and Doxil  and is currently on maintenance bevacizumab  Last CT scan from 09/2023 showed stable omental nodule. CA125 is normal  - Labs reviewed today: CMP: Elevated creatinine at 1.43, normal LFTs.  CBC: Hemoglobin: 10.4, normal WBC and platelets. - Continue bevacizumab  every 3 weeks. - Repeat CT scan in 3 months.  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  - Will order ultrasound of the leg to rule out DVT. - Consult with primary care physician regarding statin management.  Urinary symptoms Itching and irritation may indicate residual infection post-antibiotics.  - Review urine test results for infection. - If urine test shows infection, will prescribe antibiotics.  Dehydration Low blood pressure and slightly elevated creatinine  -Will administer IV normal saline 1 L - Recommended patient to stay hydrated   The patient understands the plans discussed today and is in agreement with them.  She knows to contact our office if she develops concerns prior to her next appointment.  40 minutes of total time was  spent for this patient encounter, including preparation,review of records,  face-to-face counseling with the patient and coordination of care, physical exam, and documentation of the encounter.    LILLETTE Rebecca [name],acting as a Neurosurgeon for Mickiel Dry, MD.,have documented all relevant documentation on the behalf of Mickiel Dry, MD,as directed by  Mickiel Dry, MD while in the presence of Mickiel Dry, MD.  I, Mickiel Dry MD, have reviewed the above documentation for accuracy and completeness, and I agree with the above.     Rebecca [name]  [facility] [place] - A DEPT OF [facility] [address] [place] [address] Dept: [phone] Dept Fax: [phone]   No orders of the defined types were placed in this encounter.    ONCOLOGY HISTORY:   I have reviewed her chart and materials related to her cancer extensively and collaborated history with the patient. Summary of oncologic history is as follows:   Diagnosis: Stage IIIb high-grade serous ovarian carcinoma   -Presentation: Abdominal distention and bloating sensation -12/05/2020: CT abdomen: Very large [at least 23 x17x 18 cm] heterogeneous mass in the lower abdomen and pelvis.  Lesion demonstrates cystic and solid components.  Solid components measure up to at least 18 x 14 cm.  The lesion compresses the urinary bladder and is most suspicious for ovarian malignancy until proven otherwise. -12/05/2020: CA 125: 10,000 -12/18/2020: CT chest: Scattered solid pulmonary nodules measuring up to 3 mm, indeterminate.  -12/19/2020: Exploratory laparotomy, bilateral salpingo-oophorectomy, infra gastric omentectomy, R0 primary tumor debulking.  -Pathology: Left ovary and fallopian tube positive for high grade serous adenocarcinoma (26.5 cm), involving tubal  fimbriae, ovarian surface, and ovarian parenchyma. Omentum positive for high grade serous adenocarcinoma (1.0 cm,  measured microscopically), involving fibroadipose tissue. Right ovary and fallopian tube negative for tumor. Tumor cells are p53 positive, p16 positive, and WT-1 positive. Staging: pT3b. FIGO Stage: IIIB.  -Cytology of peritoneal washing negative for malignancy.  -Germline mutation testing: negative (as per documentation) -02/11/2021-05/28/2021: 6 cycles of carboplatin  and paclitaxel   -05/28/2021-06/24/2022: Patient refused niraparib  maintenance and remained under surveillance with imaging showing stable to NED -06/24/2022: CT [facility]: 1.5 x 1.4 cm soft tissue nodule along the left pelvic sidewall adjacent to the left vaginal [name] is new in the interval. 6 mm soft tissue nodule in the anterior left subdiaphragmatic fat just above the splenic flexure is stable in the interval. -11/10/2022: CT AP: Interval increase in size of soft tissue nodule along the left pelvic sidewall abutting the left posterior wall of the bladder. Findings are concerning for progressive metastatic disease.Stable small peritoneal nodule within the left upper quadrant of the abdomen. -11/24/2022: Patient evaluated by Dr. Eloy [gynecologic oncology at [facility]] and felt not to be a candidate for debulking surgery -12/04/2022: Her2/neu IHC analysis: negative (1+) involving 12% of cells. -01/12/2023-06/16/2023: 6 cycles of carboplatin  and doxil   -05/12/2023: CT [facility]: Diminished size of a soft tissue nodule at the left aspect of the vaginal [name], consistent with treatment response. Unchanged small nodule just anteriorly. Unchanged small peritoneal nodule in the left upper quadrant adjacent to the splenic flexure. No new evidence of lymphadenopathy or metastatic disease in the abdomen or pelvis. -06/15/2023: FOLR1 IHC analysis: Negative involving <1% of cells.  -07/23/2023-current: Maintenance bevacizumab     Current Treatment:  Maintenance Bevacizumab   INTERVAL HISTORY:   Discussed the use of AI scribe software for clinical note  transcription with the patient, who gave verbal consent to proceed.  History of Present Illness Rebecca [name] is a 78 year old female who presents to establish care with me for high-grade serous ovarian carcinoma.    Ms. Parran was recently admitted to the hospital for UTI and suspicion of stroke but was ruled out.  At this time she was changed from Lipitor to Crestor .  She experiences significant leg pain that began approximately three days after starting Crestor . The pain starts in her leg, radiates down to her foot, and extends up to her groin, making it difficult for her to move. She describes the pain as severe, stating 'I couldn't hardly get up and down.'  She recounts a recent hospitalization due to a 'bad kidney infection,' during which she received antibiotics. She completed a course of antibiotics in the hospital but expresses concern about ongoing symptoms, including itching and irritation, which she attributes to the kidney infection. She mentions feeling 'sluggish' and believes she might still be dehydrated.  She describes a distressing experience during an MRI scan conducted during her hospital stay, which she found claustrophobic and believes was done against her will. She reports feeling unable to breathe indicating significant anxiety and distress related to the procedure.   I have reviewed the past medical history, past surgical history, social history and family history with the patient and they are unchanged from previous note.  ALLERGIES:  is allergic to aspirin, mometasone, naproxen sodium, penicillin g, triamcinolone, codeine, ibuprofen, and iodinated contrast media.  MEDICATIONS:  Current Outpatient Medications  Medication Sig Dispense Refill   acetaminophen  (TYLENOL ) 325 MG tablet Take 3 tablets (975 mg total) by mouth every 6 (six) hours as needed for moderate pain. (Patient taking differently: Take  650 mg by mouth every 6 (six) hours as needed for moderate pain (pain  score 4-6).) 360 tablet 2   B-D ULTRAFINE III SHORT PEN 31G X 8 MM MISC SMARTSIG:1 Each SUB-Q Daily     baclofen (LIORESAL) 10 MG tablet Take 10 mg by mouth 3 (three) times daily as needed for muscle spasms.     clopidogrel  (PLAVIX ) 75 MG tablet Take 75 mg by mouth daily.     diphenhydrAMINE -zinc acetate (BENADRYL ) cream Apply 1 application topically 3 (three) times daily as needed for itching.     docusate sodium  (COLACE) 100 MG capsule Take 1 capsule (100 mg total) by mouth daily as needed for mild constipation. 30 capsule 1   ferrous sulfate 325 (65 FE) MG tablet Take 325 mg by mouth daily.     furosemide  (LASIX ) 20 MG tablet Take 1 tablet (20 mg total) by mouth in the morning. (Patient taking differently: Take 20 mg by mouth daily as needed for fluid.) 30 tablet 2   lidocaine -prilocaine  (EMLA ) cream Apply to affected area once 30 g 3   losartan  (COZAAR ) 50 MG tablet Take 50 mg by mouth at bedtime.     magnesium  oxide (MAG-OX) 400 (240 Mg) MG tablet Take 1 tablet (400 mg total) by mouth 2 (two) times daily. 60 tablet 6   Misc. Devices MISC Please provide patient with 3 diabetic nutritional supplements per day. 1 each 11   pantoprazole  (PROTONIX ) 20 MG tablet Take 20 mg by mouth daily.     pioglitazone (ACTOS) 15 MG tablet Take 15 mg by mouth daily.     predniSONE  (DELTASONE ) 50 MG tablet Take 13hrs, 7hrs and 1hr before port placementTake 13hrs, 7hrs and 1hr before procedure (Patient not taking: Reported on 11/16/2023) 3 tablet 0   rosuvastatin  (CRESTOR ) 20 MG tablet Take 1 tablet (20 mg total) by mouth daily. 30 tablet 2   SOLIQUA 100-33 UNT-MCG/ML SOPN Inject 35 Units into the skin daily.     Tetrahydrozoline HCl (REDNESS RELIEVER EYE DROPS OP) Place 1 drop into both eyes daily as needed (redness).     No current facility-administered medications for this visit.    REVIEW OF SYSTEMS:   Constitutional: Denies fevers, chills or abnormal weight loss Eyes: Denies blurriness of vision Ears,  nose, mouth, throat, and face: Denies mucositis or sore throat Respiratory: Denies cough, dyspnea or wheezes Cardiovascular: Denies palpitation, chest discomfort or lower extremity swelling Gastrointestinal:  Denies nausea, heartburn or change in bowel habits Skin: Denies abnormal skin rashes Lymphatics: Denies new lymphadenopathy or easy bruising Neurological:Denies numbness, tingling or new weaknesses Behavioral/Psych: Mood is stable, no new changes  All other systems were reviewed with the patient and are negative.   VITALS:  There were no vitals taken for this visit.  Wt Readings from Last 3 Encounters:  11/16/23 194 lb 3.6 oz (88.1 kg)  11/11/23 195 lb 8.8 oz (88.7 kg)  10/21/23 198 lb 3.1 oz (89.9 kg)    There is no height or weight on file to calculate BMI.  Performance status (ECOG): 1 - Symptomatic but completely ambulatory  PHYSICAL EXAM:   GENERAL:alert, no distress and comfortable SKIN: skin color, texture, turgor are normal, no rashes or significant lesions LYMPH:  no palpable lymphadenopathy in the cervical, axillary or inguinal LUNGS: clear to auscultation and percussion with normal breathing effort HEART: regular rate & rhythm and no murmurs and no lower extremity edema ABDOMEN:abdomen soft, non-tender and normal bowel sounds Musculoskeletal:no cyanosis of digits and no clubbing  NEURO: alert & oriented x 3 with fluent speech, no focal motor/sensory deficits  LABORATORY DATA:  I have reviewed the data as listed   Lab Results  Component Value Date   WBC 7.4 11/16/2023   NEUTROABS 4.3 11/16/2023   HGB 11.3 (L) 11/16/2023   HCT 33.3 (L) 11/16/2023   MCV 93.0 11/16/2023   PLT 223 11/16/2023      Chemistry      Component Value Date/Time   NA 135 12/02/2023 0928   K 3.9 12/02/2023 0928   CL 103 12/02/2023 0928   CO2 19 (L) 12/02/2023 0928   BUN 22 12/02/2023 0928   CREATININE 1.43 (H) 12/02/2023 0928      Component Value Date/Time   CALCIUM  9.2  12/02/2023 0928   ALKPHOS 105 12/02/2023 0928   AST 16 12/02/2023 0928   ALT 12 12/02/2023 0928   BILITOT 0.4 12/02/2023 0928       Latest Reference Range & Units 11/11/23 13:22  Cancer Antigen (CA) 125 0.0 - 38.1 U/mL 13.6   RADIOGRAPHIC STUDIES: I have personally reviewed the radiological report below  ECHOCARDIOGRAM COMPLETE    ECHOCARDIOGRAM REPORT       Patient Name:   Rebecca [name] Date of Exam: 11/17/2023 Medical Rec #:  [id number]     Height:       64.0 in Accession #:    [id number]    Weight:       194.2 lb Date of Birth:  Jan 02, 1946      BSA:          1.932 m Patient Age:    78 years      BP:           136/72 mmHg Patient Gender: F             HR:           95 bpm. Exam Location:  [facility]  Procedure: 2D Echo, Cardiac Doppler and Color Doppler (Both Spectral and Color            Flow Doppler were utilized during procedure).  Indications:    TIA G45.9   History:        Patient has prior history of Echocardiogram examinations, most                 recent 01/07/2023. CAD, TIA; Risk Factors:Hypertension and                 Diabetes. Ovarian cancer on left [facility]).   Sonographer:    Aida Pizza RCS Referring Phys: [phone] EJIROGHENE E EMOKPAE  IMPRESSIONS   1. Left ventricular ejection fraction, by estimation, is 60 to 65%. The left ventricle has normal function. The left ventricle has no regional wall motion abnormalities. There is moderate left ventricular hypertrophy. Left ventricular diastolic  parameters are indeterminate.  2. Right ventricular systolic function is normal. The right ventricular size is normal.  3. Left atrial size was mildly dilated.  4. The mitral valve is normal in structure. No evidence of mitral valve regurgitation. No evidence of mitral stenosis. Moderate mitral annular calcification.  5. The aortic valve is tricuspid. There is mild calcification of the aortic valve. There is mild thickening of the aortic valve. Aortic valve regurgitation is not  visualized. No aortic stenosis is present.  6. The inferior vena cava is normal in size with greater than 50% respiratory variability, suggesting right atrial pressure of 3 mmHg.  FINDINGS  Left Ventricle: Left ventricular ejection  fraction, by estimation, is 60 to 65%. The left ventricle has normal function. The left ventricle has no regional wall motion abnormalities. The left ventricular internal cavity size was normal in size. There is  moderate left ventricular hypertrophy. Left ventricular diastolic parameters are indeterminate.  Right Ventricle: The right ventricular size is normal. Right vetricular wall thickness was not well visualized. Right ventricular systolic function is normal.  Left Atrium: Left atrial size was mildly dilated.  Right Atrium: Right atrial size was normal in size.  Pericardium: There is no evidence of pericardial effusion.  Mitral Valve: The mitral valve is normal in structure. There is mild thickening of the mitral valve leaflet(s). There is mild calcification of the mitral valve leaflet(s). Moderate mitral annular calcification. No evidence of mitral valve regurgitation.  No evidence of mitral valve stenosis.  Tricuspid Valve: The tricuspid valve is normal in structure. Tricuspid valve regurgitation is not demonstrated. No evidence of tricuspid stenosis.  Aortic Valve: The aortic valve is tricuspid. There is mild calcification of the aortic valve. There is mild thickening of the aortic valve. There is mild aortic valve annular calcification. Aortic valve regurgitation is not visualized. No aortic stenosis  is present. Aortic valve mean gradient measures 7.1 mmHg. Aortic valve peak gradient measures 14.7 mmHg. Aortic valve area, by VTI measures 1.50 cm.  Pulmonic Valve: The pulmonic valve was not well visualized. Pulmonic valve regurgitation is not visualized. No evidence of pulmonic stenosis.  Aorta: The aortic root is normal in size and structure.  Venous:  The inferior vena cava is normal in size with greater than 50% respiratory variability, suggesting right atrial pressure of 3 mmHg.  IAS/Shunts: No atrial level shunt detected by color flow Doppler.    LEFT VENTRICLE PLAX 2D LVIDd:         4.30 cm LVIDs:         3.00 cm LV PW:         1.40 cm LV IVS:        1.40 cm LVOT diam:     1.70 cm LV SV:         56 LV SV Index:   29 LVOT Area:     2.27 cm    RIGHT VENTRICLE RV S prime:     13.30 cm/s TAPSE (M-mode): 2.0 cm  LEFT ATRIUM             Index        RIGHT ATRIUM           Index LA diam:        3.35 cm 1.73 cm/m   RA Area:     12.40 cm LA Vol (A2C):   77.2 ml 39.96 ml/m  RA Volume:   25.40 ml  13.15 ml/m LA Vol (A4C):   63.0 ml 32.61 ml/m LA Biplane Vol: 71.9 ml 37.21 ml/m  AORTIC VALVE AV Area (Vmax):    1.41 cm AV Area (Vmean):   1.60 cm AV Area (VTI):     1.50 cm AV Vmax:           191.86 cm/s AV Vmean:          122.241 cm/s AV VTI:            0.374 m AV Peak Grad:      14.7 mmHg AV Mean Grad:      7.1 mmHg LVOT Vmax:         119.00 cm/s LVOT Vmean:  86.000 cm/s LVOT VTI:          0.247 m LVOT/AV VTI ratio: 0.66   AORTA Ao Root diam: 3.70 cm  MITRAL VALVE MV Area (PHT): 5.84 cm     SHUNTS MV Decel Time: 130 msec     Systemic VTI:  0.25 m MV E velocity: 148.00 cm/s  Systemic Diam: 1.70 cm  Dorn Ross MD Electronically signed by Dorn Ross MD Signature Date/Time: 11/17/2023/1:07:18 PM      Final   US  Carotid Bilateral (at [facility] and [facility] only) CLINICAL DATA:  78 year old female with history transient ischemic attack.  EXAM: BILATERAL CAROTID DUPLEX ULTRASOUND  TECHNIQUE: Elnor scale imaging, color Doppler and duplex ultrasound were performed of bilateral carotid and vertebral arteries in the neck.  COMPARISON:  None Available.  FINDINGS: Criteria: Quantification of carotid stenosis is based on velocity parameters that correlate the residual internal carotid diameter with  NASCET-based stenosis levels, using the diameter of the distal internal carotid lumen as the denominator for stenosis measurement.  The following velocity measurements were obtained:  RIGHT  ICA: Peak systolic velocity 84 cm/sec, End diastolic velocity 17 cm/sec  CCA: Peak systolic velocity 67 cm/sec  SYSTOLIC ICA/CCA RATIO:  1.2  ECA: Peak systolic velocity 97 cm/sec  LEFT  ICA: Peak systolic velocity 114 cm/sec, End diastolic velocity 21 cm/sec  CCA: 99 cm/sec  SYSTOLIC ICA/CCA RATIO:  1.2  ECA: 121 cm/sec  RIGHT CAROTID ARTERY: Mild multifocal atherosclerotic plaque formation. No significant tortuosity. Normal low resistance waveforms.  RIGHT VERTEBRAL ARTERY:  Antegrade flow.  LEFT CAROTID ARTERY: Mild multifocal atherosclerotic plaque formation. No significant tortuosity. Normal low resistance waveforms.  LEFT VERTEBRAL ARTERY:  Antegrade flow.  Upper extremity non-invasive blood pressures:  Not obtained.  IMPRESSION: 1. Right carotid artery system: Less than 50% stenosis secondary to atherosclerotic plaque formation.  2. Left carotid artery system: Less than 50% stenosis secondary to atherosclerotic plaque formation.  3.  Vertebral artery system: Patent with antegrade flow bilaterally.  Ester Sides, MD  [facility]  [facility]  Electronically Signed   By: Ester Sides M.D.   On: 11/17/2023 10:25

## 2023-12-02 ENCOUNTER — Encounter: Payer: Self-pay | Admitting: Oncology

## 2023-12-02 ENCOUNTER — Inpatient Hospital Stay

## 2023-12-02 ENCOUNTER — Other Ambulatory Visit: Payer: Self-pay | Admitting: *Deleted

## 2023-12-02 ENCOUNTER — Inpatient Hospital Stay: Attending: Hematology

## 2023-12-02 ENCOUNTER — Other Ambulatory Visit: Payer: Self-pay | Admitting: Oncology

## 2023-12-02 ENCOUNTER — Inpatient Hospital Stay (HOSPITAL_BASED_OUTPATIENT_CLINIC_OR_DEPARTMENT_OTHER): Admitting: Oncology

## 2023-12-02 VITALS — BP 102/48 | HR 90 | Temp 97.8°F | Resp 18 | Wt 194.4 lb

## 2023-12-02 VITALS — BP 142/60 | HR 88 | Temp 97.8°F | Resp 18

## 2023-12-02 DIAGNOSIS — D709 Neutropenia, unspecified: Secondary | ICD-10-CM | POA: Insufficient documentation

## 2023-12-02 DIAGNOSIS — C562 Malignant neoplasm of left ovary: Secondary | ICD-10-CM | POA: Insufficient documentation

## 2023-12-02 DIAGNOSIS — D649 Anemia, unspecified: Secondary | ICD-10-CM | POA: Insufficient documentation

## 2023-12-02 DIAGNOSIS — I700 Atherosclerosis of aorta: Secondary | ICD-10-CM | POA: Diagnosis not present

## 2023-12-02 DIAGNOSIS — Z79899 Other long term (current) drug therapy: Secondary | ICD-10-CM | POA: Diagnosis not present

## 2023-12-02 DIAGNOSIS — Z5112 Encounter for antineoplastic immunotherapy: Secondary | ICD-10-CM | POA: Insufficient documentation

## 2023-12-02 DIAGNOSIS — D696 Thrombocytopenia, unspecified: Secondary | ICD-10-CM | POA: Diagnosis not present

## 2023-12-02 DIAGNOSIS — E860 Dehydration: Secondary | ICD-10-CM

## 2023-12-02 DIAGNOSIS — Z95828 Presence of other vascular implants and grafts: Secondary | ICD-10-CM

## 2023-12-02 LAB — CBC WITH DIFFERENTIAL/PLATELET
Abs Immature Granulocytes: 0.03 K/uL (ref 0.00–0.07)
Basophils Absolute: 0 K/uL (ref 0.0–0.1)
Basophils Relative: 0 %
Eosinophils Absolute: 0.2 K/uL (ref 0.0–0.5)
Eosinophils Relative: 2 %
HCT: 31 % — ABNORMAL LOW (ref 36.0–46.0)
Hemoglobin: 10.4 g/dL — ABNORMAL LOW (ref 12.0–15.0)
Immature Granulocytes: 0 %
Lymphocytes Relative: 18 %
Lymphs Abs: 1.7 K/uL (ref 0.7–4.0)
MCH: 31.1 pg (ref 26.0–34.0)
MCHC: 33.5 g/dL (ref 30.0–36.0)
MCV: 92.8 fL (ref 80.0–100.0)
Monocytes Absolute: 0.8 K/uL (ref 0.1–1.0)
Monocytes Relative: 8 %
Neutro Abs: 6.9 K/uL (ref 1.7–7.7)
Neutrophils Relative %: 72 %
Platelets: 233 K/uL (ref 150–400)
RBC: 3.34 MIL/uL — ABNORMAL LOW (ref 3.87–5.11)
RDW: 14.4 % (ref 11.5–15.5)
WBC: 9.6 K/uL (ref 4.0–10.5)
nRBC: 0 % (ref 0.0–0.2)

## 2023-12-02 LAB — COMPREHENSIVE METABOLIC PANEL WITH GFR
ALT: 12 U/L (ref 0–44)
AST: 16 U/L (ref 15–41)
Albumin: 4 g/dL (ref 3.5–5.0)
Alkaline Phosphatase: 105 U/L (ref 38–126)
Anion gap: 12 (ref 5–15)
BUN: 22 mg/dL (ref 8–23)
CO2: 19 mmol/L — ABNORMAL LOW (ref 22–32)
Calcium: 9.2 mg/dL (ref 8.9–10.3)
Chloride: 103 mmol/L (ref 98–111)
Creatinine, Ser: 1.43 mg/dL — ABNORMAL HIGH (ref 0.44–1.00)
GFR, Estimated: 38 mL/min — ABNORMAL LOW (ref >60)
Glucose, Bld: 212 mg/dL — ABNORMAL HIGH (ref 70–99)
Potassium: 3.9 mmol/L (ref 3.5–5.1)
Sodium: 135 mmol/L (ref 135–145)
Total Bilirubin: 0.4 mg/dL (ref 0.0–1.2)
Total Protein: 6.9 g/dL (ref 6.5–8.1)

## 2023-12-02 LAB — URINALYSIS, DIPSTICK ONLY
Bilirubin Urine: NEGATIVE
Glucose, UA: NEGATIVE mg/dL
Ketones, ur: NEGATIVE mg/dL
Nitrite: NEGATIVE
Protein, ur: 30 mg/dL — AB
Specific Gravity, Urine: 1.01 (ref 1.005–1.030)
pH: 5 (ref 5.0–8.0)

## 2023-12-02 LAB — FERRITIN: Ferritin: 632 ng/mL — ABNORMAL HIGH (ref 11–307)

## 2023-12-02 LAB — MAGNESIUM: Magnesium: 1.9 mg/dL (ref 1.7–2.4)

## 2023-12-02 MED ORDER — PREDNISONE 50 MG PO TABS: ORAL_TABLET | ORAL | 0 refills | Status: DC

## 2023-12-02 MED ORDER — DIPHENHYDRAMINE HCL 50 MG PO TABS: 50.0000 mg | ORAL_TABLET | Freq: Once | ORAL | 0 refills | Status: AC

## 2023-12-02 MED ORDER — SODIUM CHLORIDE 0.9 % IV SOLN: INTRAVENOUS | Status: DC

## 2023-12-02 MED ORDER — SODIUM CHLORIDE 0.9 % IV SOLN
15.0000 mg/kg | Freq: Once | INTRAVENOUS | Status: AC
  Administered 2023-12-02: 1300 mg via INTRAVENOUS
  Filled 2023-12-02: qty 48

## 2023-12-02 NOTE — Progress Notes (Signed)
 OK to proceed with bevacizumab  - MD aware of hospital admission.  V.O. Dr Ivery Molt, PharmD

## 2023-12-02 NOTE — Patient Instructions (Signed)
 [facility] [place] - A DEPT OF [facility]  Discharge Instructions: Thank you for choosing [facility] to provide your oncology and hematology care.  If you have a lab appointment with the Cancer Center - please note that after April 8th, 2024, all labs will be drawn in the cancer center.  You do not have to check in or register with the main entrance as you have in the past but will complete your check-in in the cancer center.  Wear comfortable clothing and clothing appropriate for easy access to any Portacath or PICC line.   We strive to give you quality time with your provider. You may need to reschedule your appointment if you arrive late (15 or more minutes).  Arriving late affects you and other patients whose appointments are after yours.  Also, if you miss three or more appointments without notifying the office, you may be dismissed from the clinic at the provider's discretion.      For prescription refill requests, have your pharmacy contact our office and allow 72 hours for refills to be completed.    Today you received the following chemotherapy and/or immunotherapy agents Vegzelma.  Bevacizumab Injection What is this medication? BEVACIZUMAB (be va SIZ yoo mab) treats some types of cancer. It works by blocking a protein that causes cancer cells to grow and multiply. This helps to slow or stop the spread of cancer cells. It is a monoclonal antibody. This medicine may be used for other purposes; ask your health care provider or pharmacist if you have questions. COMMON BRAND NAME(S): Alymsys, Avastin, MVASI, Rosaland Lao What should I tell my care team before I take this medication? They need to know if you have any of these conditions: Blood clots Coughing up blood Having or recent surgery Heart failure High blood pressure History of a connection between 2 or more body parts that do not usually connect (fistula) History of a tear in your  stomach or intestines Protein in your urine An unusual or allergic reaction to bevacizumab, other medications, foods, dyes, or preservatives Pregnant or trying to get pregnant Breast-feeding How should I use this medication? This medication is injected into a vein. It is given by your care team in a hospital or clinic setting. Talk to your care team the use of this medication in children. Special care may be needed. Overdosage: If you think you have taken too much of this medicine contact a poison control center or emergency room at once. NOTE: This medicine is only for you. Do not share this medicine with others. What if I miss a dose? Keep appointments for follow-up doses. It is important not to miss your dose. Call your care team if you are unable to keep an appointment. What may interact with this medication? Interactions are not expected. This list may not describe all possible interactions. Give your health care provider a list of all the medicines, herbs, non-prescription drugs, or dietary supplements you use. Also tell them if you smoke, drink alcohol, or use illegal drugs. Some items may interact with your medicine. What should I watch for while using this medication? Your condition will be monitored carefully while you are receiving this medication. You may need blood work while taking this medication. This medication may make you feel generally unwell. This is not uncommon as chemotherapy can affect healthy cells as well as cancer cells. Report any side effects. Continue your course of treatment even though you feel  ill unless your care team tells you to stop. This medication may increase your risk to bruise or bleed. Call your care team if you notice any unusual bleeding. Before having surgery, talk to your care team to make sure it is ok. This medication can increase the risk of poor healing of your surgical site or wound. You will need to stop this medication for 28 days before  surgery. After surgery, wait at least 28 days before restarting this medication. Make sure the surgical site or wound is healed enough before restarting this medication. Talk to your care team if questions. Talk to your care team if you may be pregnant. Serious birth defects can occur if you take this medication during pregnancy and for 6 months after the last dose. Contraception is recommended while taking this medication and for 6 months after the last dose. Your care team can help you find the option that works for you. Do not breastfeed while taking this medication and for 6 months after the last dose. This medication can cause infertility. Talk to your care team if you are concerned about your fertility. What side effects may I notice from receiving this medication? Side effects that you should report to your care team as soon as possible: Allergic reactions--skin rash, itching, hives, swelling of the face, lips, tongue, or throat Bleeding--bloody or black, tar-like stools, vomiting blood or Kadyn Chovan material that looks like coffee grounds, red or dark Sylvain Hasten urine, small red or purple spots on skin, unusual bruising or bleeding Blood clot--pain, swelling, or warmth in the leg, shortness of breath, chest pain Heart attack--pain or tightness in the chest, shoulders, arms, or jaw, nausea, shortness of breath, cold or clammy skin, feeling faint or lightheaded Heart failure--shortness of breath, swelling of the ankles, feet, or hands, sudden weight gain, unusual weakness or fatigue Increase in blood pressure Infection--fever, chills, cough, sore throat, wounds that don't heal, pain or trouble when passing urine, general feeling of discomfort or being unwell Infusion reactions--chest pain, shortness of breath or trouble breathing, feeling faint or lightheaded Kidney injury--decrease in the amount of urine, swelling of the ankles, hands, or feet Stomach pain that is severe, does not go away, or gets  worse Stroke--sudden numbness or weakness of the face, arm, or leg, trouble speaking, confusion, trouble walking, loss of balance or coordination, dizziness, severe headache, change in vision Sudden and severe headache, confusion, change in vision, seizures, which may be signs of posterior reversible encephalopathy syndrome (PRES) Side effects that usually do not require medical attention (report to your care team if they continue or are bothersome): Back pain Change in taste Diarrhea Dry skin Increased tears Nosebleed This list may not describe all possible side effects. Call your doctor for medical advice about side effects. You may report side effects to FDA at 1-800-FDA-1088. Where should I keep my medication? This medication is given in a hospital or clinic. It will not be stored at home. NOTE: This sheet is a summary. It may not cover all possible information. If you have questions about this medicine, talk to your doctor, pharmacist, or health care provider.  2024 Elsevier/Gold Standard (2021-07-05 00:00:00)       To help prevent nausea and vomiting after your treatment, we encourage you to take your nausea medication as directed.  BELOW ARE SYMPTOMS THAT SHOULD BE REPORTED IMMEDIATELY: *FEVER GREATER THAN 100.4 F (38 C) OR HIGHER *CHILLS OR SWEATING *NAUSEA AND VOMITING THAT IS NOT CONTROLLED WITH YOUR NAUSEA MEDICATION *UNUSUAL SHORTNESS  OF BREATH *UNUSUAL BRUISING OR BLEEDING *URINARY PROBLEMS (pain or burning when urinating, or frequent urination) *BOWEL PROBLEMS (unusual diarrhea, constipation, pain near the anus) TENDERNESS IN MOUTH AND THROAT WITH OR WITHOUT PRESENCE OF ULCERS (sore throat, sores in mouth, or a toothache) UNUSUAL RASH, SWELLING OR PAIN  UNUSUAL VAGINAL DISCHARGE OR ITCHING   Items with * indicate a potential emergency and should be followed up as soon as possible or go to the Emergency Department if any problems should occur.  Please show the  CHEMOTHERAPY ALERT CARD or IMMUNOTHERAPY ALERT CARD at check-in to the Emergency Department and triage nurse.  Should you have questions after your visit or need to cancel or reschedule your appointment, please contact [facility] [place] - A DEPT OF [facility]igh[facility] [phone]  and follow the prompts.  Office hours are 8:00 a.m. to 4:30 p.m. Monday - Friday. Please note that voicemails left after 4:00 p.m. may not be returned until the following business day.  We are closed weekends and major holidays. You have access to a nurse at all times for urgent questions. Please call the main number to the clinic [phone] and follow the prompts.  For any non-urgent questions, you may also contact your provider using MyChart. We now offer e-Visits for anyone 92 and older to request care online for non-urgent symptoms. For details visit [email or web address].   Also download the MyChart app! Go to the app store, search "MyChart", open the app, select , and log in with your MyChart username and password.

## 2023-12-02 NOTE — Progress Notes (Signed)
 Patient presents today for chemotherapy infusion. Patient is in satisfactory condition with no new complaints voiced.  Vital signs are stable.  Labs reviewed by Dr. Davonna during the office visit and all labs are within treatment parameters. Urine protein, 30, creatinine 1.43, and B/P 102/48.  Per Dr Davonna patient is to get additional 1 liter bolus of NS today with treatment. We will proceed with treatment per MD orders.   Patient tolerated treatment well with no complaints voiced.  Patient left ambulatory in stable condition.  Vital signs stable at discharge.  Follow up as scheduled.

## 2023-12-03 LAB — CA 125: Cancer Antigen (CA) 125: 13.5 U/mL (ref 0.0–38.1)

## 2023-12-04 ENCOUNTER — Ambulatory Visit (HOSPITAL_COMMUNITY)
Admission: RE | Admit: 2023-12-04 | Discharge: 2023-12-04 | Disposition: A | Discharge status: Home / Self Care | Source: Ambulatory Visit | Attending: Oncology | Admitting: Oncology

## 2023-12-04 ENCOUNTER — Other Ambulatory Visit: Payer: Self-pay

## 2023-12-04 DIAGNOSIS — C562 Malignant neoplasm of left ovary: Secondary | ICD-10-CM | POA: Insufficient documentation

## 2023-12-04 IMAGING — US IR IMAGING GUIDED PORT INSERTION
1 series · 2 of 2 positions shown · non-contrast
Comparison: none

INDICATION: 75-year-old woman with ovarian malignancy presents to IR for chest
port placement

[Series 1: ir imaging guided port insertion · 2 of 2 slices shown]
[im 1/2]
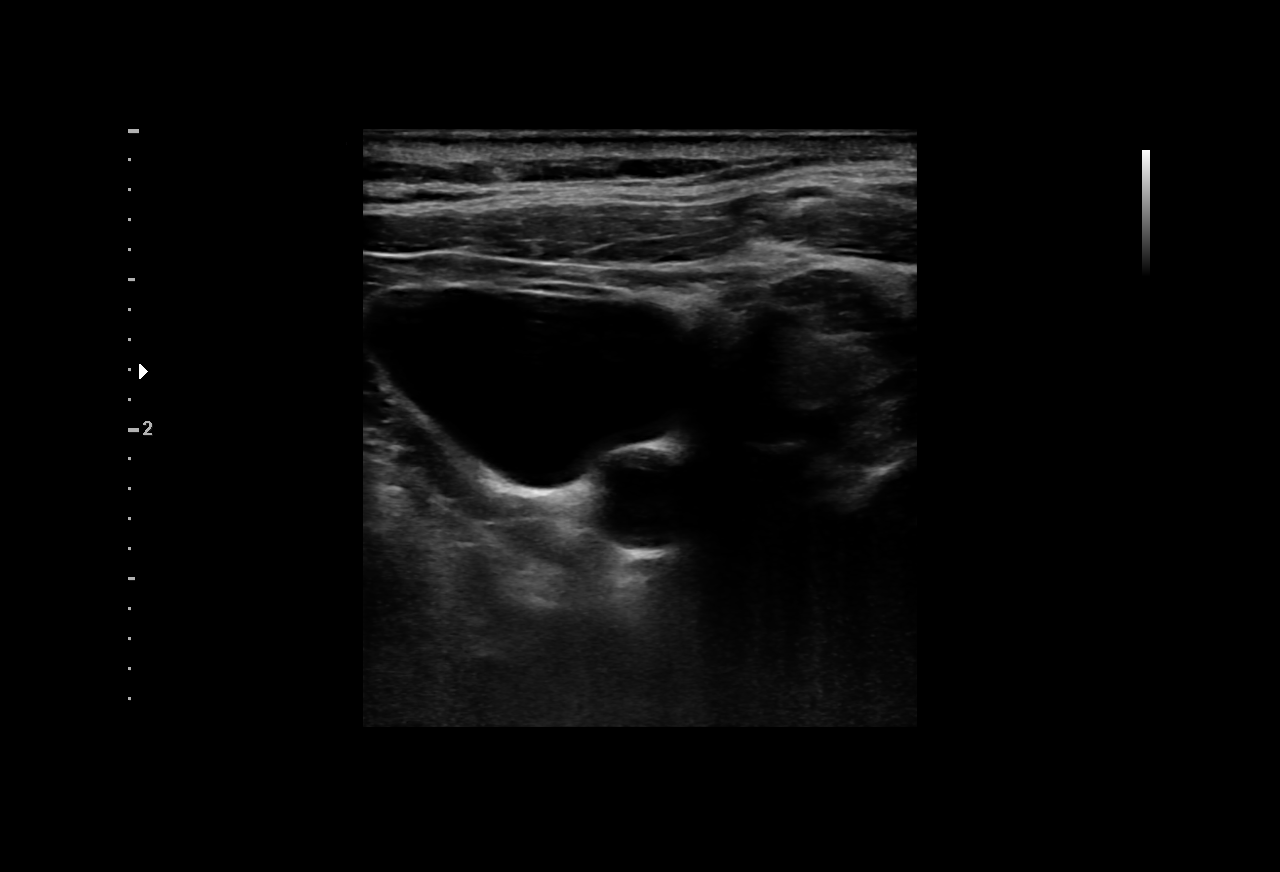
[im 2/2]
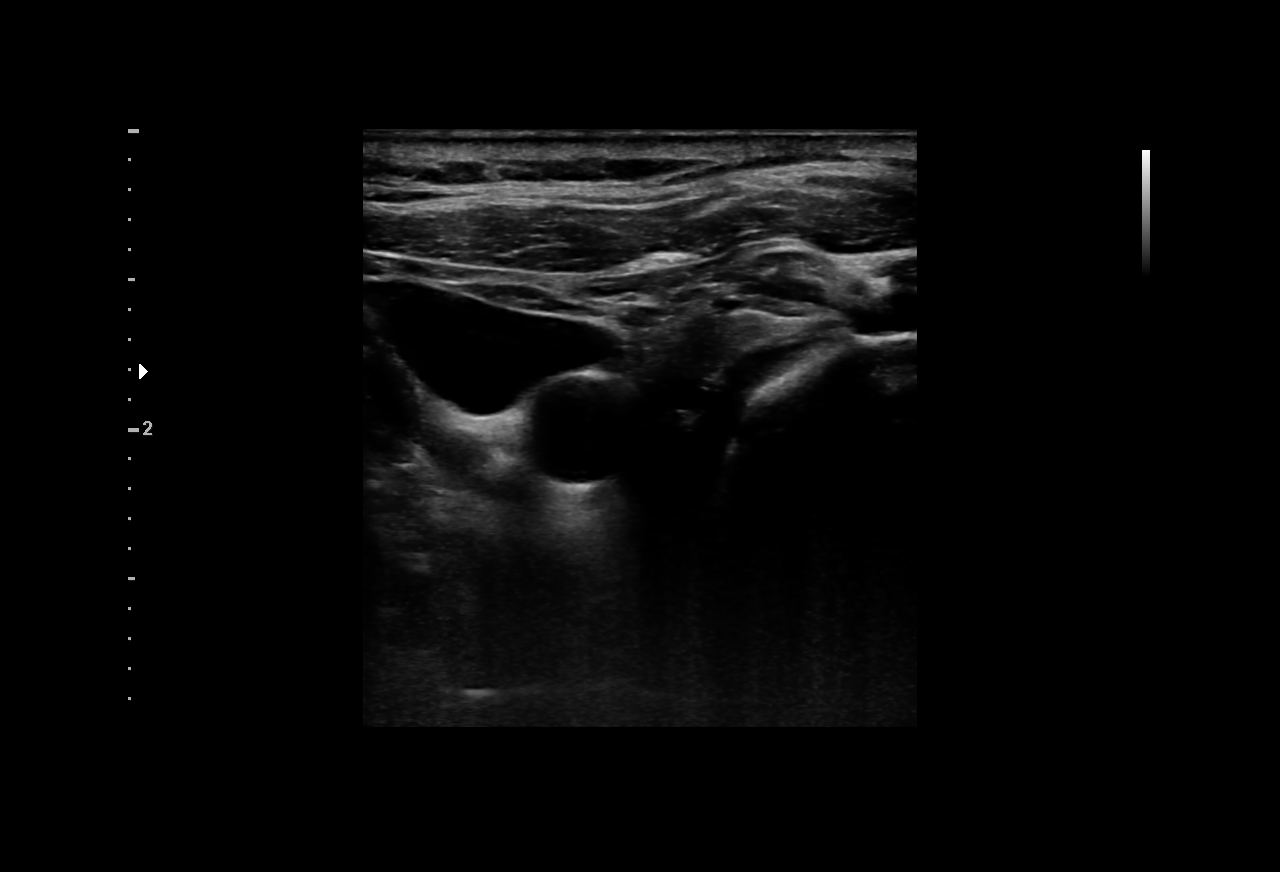

[2 of 2 positions shown; findings below may reference images not displayed]

EXAM:
IMPLANTED PORT A CATH PLACEMENT WITH ULTRASOUND AND FLUOROSCOPIC
GUIDANCE

MEDICATIONS:
None

ANESTHESIA/SEDATION:
Moderate (conscious) sedation was employed during this procedure. A
total of Versed 2 mg and Fentanyl 100 mcg was administered
intravenously.

Moderate Sedation Time: 16 minutes. The patient's level of
consciousness and vital signs were monitored continuously by
radiology nursing throughout the procedure under my direct
supervision.

FLUOROSCOPY TIME:  0 minutes, 30 seconds (1.9 mGy)

COMPLICATIONS:
None immediate.

PROCEDURE:
The procedure, risks, benefits, and alternatives were explained to
the patient. Questions regarding the procedure were encouraged and
answered. The patient understands and consents to the procedure.

A timeout was performed prior to the initiation of the procedure.

Patient positioned supine on the angiography table.

Right neck and anterior upper chest prepped and draped in the usual
sterile fashion. All elements of maximal sterile barrier were
utilized including, cap, mask, sterile gown, sterile gloves, large
sterile drape, hand scrubbing and 2% Chlorhexidine for skin
cleaning.

The right internal jugular vein was evaluated with ultrasound and
shown to be patent. A permanent ultrasound image was obtained and
placed in the patient's medical record. Local anesthesia was
provided with 1% lidocaine with epinephrine.

Using sterile gel and a sterile probe cover, the right internal
jugular vein was entered with a 21 ga needle during real time
ultrasound guidance.

0.018 inch guidewire placed and 21 ga needle exchanged for
transitional dilator set. Utilizing fluoroscopy, 0.035 inch
guidewire advanced through the needle without difficulty.

Attention then turned to the right anterior upper chest. Following
local lidocaine administration, a port pocket was created. The
catheter was connected to the port and brought from the pocket to
the venotomy site through a subcutaneous tunnel.

The catheter was cut to size and inserted through the peel-away
sheath. The catheter tip was positioned at the cavoatrial junction
using fluoroscopic guidance.

The port aspirated and flushed well. The port pocket was closed with
deep and superficial absorbable suture. The port pocket incision and
venotomy sites were also sealed with Dermabond.
IMPRESSION: Successful placement of a right internal jugular approach power
injectable Port-A-Cath. The catheter is ready for immediate use.

## 2023-12-07 ENCOUNTER — Other Ambulatory Visit: Payer: Self-pay | Admitting: *Deleted

## 2023-12-07 ENCOUNTER — Telehealth: Payer: Self-pay | Admitting: *Deleted

## 2023-12-07 MED ORDER — TRAMADOL HCL 50 MG PO TABS: 50.0000 mg | ORAL_TABLET | Freq: Three times a day (TID) | ORAL | 0 refills | Status: AC | PRN

## 2023-12-07 NOTE — Telephone Encounter (Signed)
 Patient called c/o continued sever pain in right lower leg.  Negative doppler studies.  Per Dr. Davonna, will send in Tramadol  50 mg TID x 7 days.  Patient verbalized understanding.

## 2023-12-13 ENCOUNTER — Other Ambulatory Visit: Payer: Self-pay

## 2023-12-16 ENCOUNTER — Inpatient Hospital Stay

## 2023-12-16 ENCOUNTER — Ambulatory Visit (HOSPITAL_COMMUNITY)
Admission: RE | Admit: 2023-12-16 | Discharge: 2023-12-16 | Disposition: A | Discharge status: Home / Self Care | Source: Ambulatory Visit | Attending: Oncology | Admitting: Oncology

## 2023-12-16 DIAGNOSIS — C562 Malignant neoplasm of left ovary: Secondary | ICD-10-CM

## 2023-12-16 DIAGNOSIS — Z5112 Encounter for antineoplastic immunotherapy: Secondary | ICD-10-CM | POA: Diagnosis not present

## 2023-12-16 LAB — COMPREHENSIVE METABOLIC PANEL WITH GFR
ALT: 13 U/L (ref 0–44)
AST: 14 U/L — ABNORMAL LOW (ref 15–41)
Albumin: 4.1 g/dL (ref 3.5–5.0)
Alkaline Phosphatase: 121 U/L (ref 38–126)
Anion gap: 12 (ref 5–15)
BUN: 22 mg/dL (ref 8–23)
CO2: 22 mmol/L (ref 22–32)
Calcium: 9.9 mg/dL (ref 8.9–10.3)
Chloride: 102 mmol/L (ref 98–111)
Creatinine, Ser: 1.05 mg/dL — ABNORMAL HIGH (ref 0.44–1.00)
GFR, Estimated: 55 mL/min — ABNORMAL LOW (ref >60)
Glucose, Bld: 237 mg/dL — ABNORMAL HIGH (ref 70–99)
Potassium: 4.4 mmol/L (ref 3.5–5.1)
Sodium: 135 mmol/L (ref 135–145)
Total Bilirubin: 0.5 mg/dL (ref 0.0–1.2)
Total Protein: 7.5 g/dL (ref 6.5–8.1)

## 2023-12-16 LAB — CBC WITH DIFFERENTIAL/PLATELET
Abs Immature Granulocytes: 0.07 K/uL (ref 0.00–0.07)
Basophils Absolute: 0 K/uL (ref 0.0–0.1)
Basophils Relative: 0 %
Eosinophils Absolute: 0 K/uL (ref 0.0–0.5)
Eosinophils Relative: 0 %
HCT: 32.9 % — ABNORMAL LOW (ref 36.0–46.0)
Hemoglobin: 11 g/dL — ABNORMAL LOW (ref 12.0–15.0)
Immature Granulocytes: 1 %
Lymphocytes Relative: 10 %
Lymphs Abs: 0.8 K/uL (ref 0.7–4.0)
MCH: 31.6 pg (ref 26.0–34.0)
MCHC: 33.4 g/dL (ref 30.0–36.0)
MCV: 94.5 fL (ref 80.0–100.0)
Monocytes Absolute: 0.1 K/uL (ref 0.1–1.0)
Monocytes Relative: 2 %
Neutro Abs: 6.9 K/uL (ref 1.7–7.7)
Neutrophils Relative %: 87 %
Platelets: 259 K/uL (ref 150–400)
RBC: 3.48 MIL/uL — ABNORMAL LOW (ref 3.87–5.11)
RDW: 14.1 % (ref 11.5–15.5)
WBC: 7.9 K/uL (ref 4.0–10.5)
nRBC: 0 % (ref 0.0–0.2)

## 2023-12-16 LAB — MAGNESIUM: Magnesium: 2 mg/dL (ref 1.7–2.4)

## 2023-12-16 LAB — FERRITIN: Ferritin: 734 ng/mL — ABNORMAL HIGH (ref 11–307)

## 2023-12-16 MED ORDER — IOHEXOL 300 MG/ML  SOLN
100.0000 mL | Freq: Once | INTRAMUSCULAR | Status: AC | PRN
Start: 2023-12-16 — End: 2023-12-16
  Administered 2023-12-16: 100 mL via INTRAVENOUS

## 2023-12-16 MED ORDER — IOHEXOL 9 MG/ML PO SOLN
500.0000 mL | ORAL | Status: AC
  Administered 2023-12-16: 500 mL via ORAL

## 2023-12-16 MED ORDER — IOHEXOL 9 MG/ML PO SOLN
ORAL | Status: AC
  Filled 2023-12-16: qty 1000

## 2023-12-16 NOTE — Patient Instructions (Signed)
 [facility] [place] - A DEPT OF [place]  Discharge Instructions: Thank you for choosing [facility] to provide your oncology and hematology care.  If you have a lab appointment with the Cancer Center - please note that after April 8th, 2024, all labs will be drawn in the cancer center.  You do not have to check in or register with the main entrance as you have in the past but will complete your check-in in the cancer center.  Wear comfortable clothing and clothing appropriate for easy access to any Portacath or PICC line.   We strive to give you quality time with your provider. You may need to reschedule your appointment if you arrive late (15 or more minutes).  Arriving late affects you and other patients whose appointments are after yours.  Also, if you miss three or more appointments without notifying the office, you may be dismissed from the clinic at the provider's discretion.      For prescription refill requests, have your pharmacy contact our office and allow 72 hours for refills to be completed.    Today you received the following port flush with lab for CT scan, return as scheduled.   To help prevent nausea and vomiting after your treatment, we encourage you to take your nausea medication as directed.  BELOW ARE SYMPTOMS THAT SHOULD BE REPORTED IMMEDIATELY: *FEVER GREATER THAN 100.4 F (38 C) OR HIGHER *CHILLS OR SWEATING *NAUSEA AND VOMITING THAT IS NOT CONTROLLED WITH YOUR NAUSEA MEDICATION *UNUSUAL SHORTNESS OF BREATH *UNUSUAL BRUISING OR BLEEDING *URINARY PROBLEMS (pain or burning when urinating, or frequent urination) *BOWEL PROBLEMS (unusual diarrhea, constipation, pain near the anus) TENDERNESS IN MOUTH AND THROAT WITH OR WITHOUT PRESENCE OF ULCERS (sore throat, sores in mouth, or a toothache) UNUSUAL RASH, SWELLING OR PAIN  UNUSUAL VAGINAL DISCHARGE OR ITCHING   Items with * indicate a potential emergency and should be followed up  as soon as possible or go to the Emergency Department if any problems should occur.  Please show the CHEMOTHERAPY ALERT CARD or IMMUNOTHERAPY ALERT CARD at check-in to the Emergency Department and triage nurse.  Should you have questions after your visit or need to cancel or reschedule your appointment, please contact [facility] [place] - A DEPT OF [facility] [phone]  and follow the prompts.  Office hours are 8:00 a.m. to 4:30 p.m. Monday - Friday. Please note that voicemails left after 4:00 p.m. may not be returned until the following business day.  We are closed weekends and major holidays. You have access to a nurse at all times for urgent questions. Please call the main number to the clinic [phone] and follow the prompts.  For any non-urgent questions, you may also contact your provider using MyChart. We now offer e-Visits for anyone 20 and older to request care online for non-urgent symptoms. For details visit [email or web address].   Also download the MyChart app! Go to the app store, search MyChart, open the app, select [place], and log in with your MyChart username and password.

## 2023-12-16 NOTE — Progress Notes (Signed)
 Patient's accessed for CT scan with good blood return noted. Patient reports she will return to clinic after scan to have needle removed.  Port flushed with good blood return noted. No bruising or swelling at site. Bandaid applied and patient discharged in satisfactory condition. VVS stable with no signs or symptoms of distressed noted.

## 2023-12-17 LAB — CA 125: Cancer Antigen (CA) 125: 14.4 U/mL (ref 0.0–38.1)

## 2023-12-23 ENCOUNTER — Other Ambulatory Visit

## 2023-12-23 ENCOUNTER — Ambulatory Visit

## 2023-12-25 ENCOUNTER — Inpatient Hospital Stay

## 2023-12-25 ENCOUNTER — Inpatient Hospital Stay: Admitting: Oncology

## 2023-12-25 DIAGNOSIS — C562 Malignant neoplasm of left ovary: Secondary | ICD-10-CM | POA: Diagnosis not present

## 2023-12-25 DIAGNOSIS — D649 Anemia, unspecified: Secondary | ICD-10-CM | POA: Diagnosis not present

## 2023-12-25 DIAGNOSIS — Z5112 Encounter for antineoplastic immunotherapy: Secondary | ICD-10-CM | POA: Diagnosis not present

## 2023-12-25 LAB — CBC WITH DIFFERENTIAL/PLATELET
Abs Immature Granulocytes: 0.03 K/uL (ref 0.00–0.07)
Basophils Absolute: 0 K/uL (ref 0.0–0.1)
Basophils Relative: 1 %
Eosinophils Absolute: 0.1 K/uL (ref 0.0–0.5)
Eosinophils Relative: 2 %
HCT: 30.7 % — ABNORMAL LOW (ref 36.0–46.0)
Hemoglobin: 10.1 g/dL — ABNORMAL LOW (ref 12.0–15.0)
Immature Granulocytes: 1 %
Lymphocytes Relative: 24 %
Lymphs Abs: 1.3 K/uL (ref 0.7–4.0)
MCH: 31.2 pg (ref 26.0–34.0)
MCHC: 32.9 g/dL (ref 30.0–36.0)
MCV: 94.8 fL (ref 80.0–100.0)
Monocytes Absolute: 0.6 K/uL (ref 0.1–1.0)
Monocytes Relative: 10 %
Neutro Abs: 3.4 K/uL (ref 1.7–7.7)
Neutrophils Relative %: 62 %
Platelets: 212 K/uL (ref 150–400)
RBC: 3.24 MIL/uL — ABNORMAL LOW (ref 3.87–5.11)
RDW: 14.3 % (ref 11.5–15.5)
WBC: 5.5 K/uL (ref 4.0–10.5)
nRBC: 0 % (ref 0.0–0.2)

## 2023-12-25 LAB — MAGNESIUM: Magnesium: 1.7 mg/dL (ref 1.7–2.4)

## 2023-12-25 LAB — FERRITIN: Ferritin: 612 ng/mL — ABNORMAL HIGH (ref 11–307)

## 2023-12-25 NOTE — Progress Notes (Signed)
No treatment today per MD.  

## 2023-12-25 NOTE — Progress Notes (Signed)
 Patient Care Team: [name], [name], NP as PCP - General (Nurse Practitioner) [name], RN as Oncology Nurse Navigator (Oncology) Kristine [name], NP as Nurse Practitioner (Cardiothoracic Surgery)  Clinic Day:  12/27/2023  Referring physician: [name]   CHIEF COMPLAINT:  CC: Stage IIIb high-grade serous ovarian carcinoma   ASSESSMENT & PLAN:   Assessment & Plan: Rebecca [name]  is a 78 y.o. female with high-grade serous ovarian carcinoma   Assessment and Plan Assessment & Plan Recurrent high-grade serous ovarian carcinoma.   Extensive oncology history below Patient completed 6 cycles of carboplatin  and Doxil  and is currently on maintenance bevacizumab  Last CT scan from 09/2023 showed stable omental nodule. CA125 is normal  - We reviewed the recent CT scan findings together.  Patient has a vaginal cuff nodule that has been increasing in size. -We discussed the need of MRI pelvis to further characterize this lesion.  Patient does not want to proceed with MRI at this time.  - Discussed various management strategies at this time.  Discussed doing a closer monitoring with a CT scan and awaiting on changing treatment at this time or, since patient has good response to chemotherapy prior to this and she is 6 months out since she got her last chemotherapy, can consider adding chemotherapy to bevacizumab . - Patient at this time is wishing to proceed with chemotherapy.  Risk versus benefits of carboplatin  and Doxil /doxorubicin  were discussed in detail.   -Side effects discussed in detail.Carboplatin  commonly causes myelosuppression (anemia, neutropenia, thrombocytopenia), nausea, and hypersensitivity reactions, while pegylated liposomal doxorubicin  can lead to hand-foot syndrome, mucositis, myelosuppression, and cardiotoxicity. -Labs reviewed today: CBC: Hemoglobin: 10.1, normal WBC and platelets. - Will hold treatment today and restart  chemotherapy plus bevacizumab  next week.   - Repeat CT scan in 3 months.  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 Ultrasound leg negative for DVT  - Improved at this time.  Normocytic anemia Likely secondary to bevacizumab  use.  Ferritin: 612. No signs of bleeding noted  -Continue to monitor at this time.   The patient understands the plans discussed today and is in agreement with them.  She knows to contact our office if she develops concerns prior to her next appointment.  30 minutes of total time was spent for this patient encounter, including preparation,review of records,  face-to-face counseling with the patient and coordination of care, physical exam, and documentation of the encounter.   I, Marijo Sharps, acting as a neurosurgeon for [employer], MD.,have documented all relevant documentation on the behalf of Rebecca Dry, MD,as directed by  Rebecca Dry, MD while in the presence of Rebecca Dry, MD.  I, Rebecca Dry MD, have reviewed the above documentation for accuracy and completeness, and I agree with the above.    Rebecca Dry, MD  [facility] [place] - A DEPT OF [facility] [address] [place] [address] Dept: [phone] Dept Fax: [phone]   No orders of the defined types were placed in this encounter.    ONCOLOGY HISTORY:   I have reviewed her chart and materials related to her cancer extensively and collaborated history with the patient. Summary of oncologic history is as follows:   Diagnosis: Stage IIIb high-grade serous ovarian carcinoma   -Presentation: Abdominal distention and bloating sensation -  12/05/2020: CT abdomen: Very large [at least 23 x17x 18 cm] heterogeneous mass in the lower abdomen and  pelvis.  Lesion demonstrates cystic and solid components.  Solid components measure up to at least 18 x 14 cm.  The lesion compresses the urinary bladder and is most suspicious for ovarian malignancy until proven otherwise. -12/05/2020: CA 125: 10,000 -12/18/2020: CT chest: Scattered solid pulmonary nodules measuring up to 3 mm, indeterminate.  -12/19/2020: Exploratory laparotomy, bilateral salpingo-oophorectomy, infra gastric omentectomy, R0 primary tumor debulking.  -Pathology: Left ovary and fallopian tube positive for high grade serous adenocarcinoma (26.5 cm), involving tubal fimbriae, ovarian surface, and ovarian parenchyma. Omentum positive for high grade serous adenocarcinoma (1.0 cm, measured microscopically), involving fibroadipose tissue. Right ovary and fallopian tube negative for tumor. Tumor cells are p53 positive, p16 positive, and WT-1 positive. Staging: pT3b. FIGO Stage: IIIB.  -Cytology of peritoneal washing negative for malignancy.  -Germline mutation testing: negative (as per documentation) -02/11/2021-05/28/2021: 6 cycles of carboplatin  and paclitaxel   -05/28/2021-06/24/2022: Patient refused niraparib  maintenance and remained under surveillance with imaging showing stable to NED -06/24/2022: CT AP: 1.5 x 1.4 cm soft tissue nodule along the left pelvic sidewall adjacent to the left vaginal cuff is new in the interval. 6 mm soft tissue nodule in the anterior left subdiaphragmatic fat just above the splenic flexure is stable in the interval. -11/10/2022: CT AP: Interval increase in size of soft tissue nodule along the left pelvic sidewall abutting the left posterior wall of the bladder. Findings are concerning for progressive metastatic disease.Stable small peritoneal nodule within the left upper quadrant of the abdomen. -11/24/2022: Patient evaluated by Dr. Eloy [gynecologic oncology at [facility]] and felt not to be a candidate for debulking surgery -12/04/2022: Her2/neu IHC analysis:  negative (1+) involving 12% of cells. -01/12/2023-06/16/2023: 6 cycles of carboplatin  and doxil   -05/12/2023: CT AP: Diminished size of a soft tissue nodule at the left aspect of the vaginal cuff, consistent with treatment response. Unchanged small nodule just anteriorly. Unchanged small peritoneal nodule in the left upper quadrant adjacent to the splenic flexure. No new evidence of lymphadenopathy or metastatic disease in the abdomen or pelvis. -06/15/2023: FOLR1 IHC analysis: Negative involving <1% of cells.  -07/23/2023-current: Maintenance bevacizumab   -09/02/2023: CT AP with contrast: Stable exam since 05/12/2023. No acute intra-abdominal or pelvic pathology. Aortic Atherosclerosis. -11/16/2023: CT Head without contrast: Mild chronic small vessel disease throughout the deep white matter. No acute intracranial abnormality. -11/16/2023: MR Brain w/wo contrast: No acute intracranial abnormality. Mild chronic microvascular ischemic changes and mild parenchymal volume loss. 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 - 12/16/2023: CT CAP: Slight increase in size of the 2.3 x 1.3 cm asymmetric soft tissue along the left vaginal cuff, suspicious for local recurrent disease.No pathologically enlarged abdominal or pelvic lymph nodes. No evidence of metastatic disease in the chest.  Current Treatment: Planned carboplatin + Doxil + bevacizumab   INTERVAL HISTORY:  Rebecca [name] is a 78 year old female who presents to follow up for high-grade serous ovarian carcinoma.  She was accompanied by her son today.  Keundra reported feeling well overall.  We discussed the CT scan findings in detail.  We also discussed the need for further MRI to better assess the growing lesion. Margot stated that she does not wish to receive any additional MRI scans due to her claustrophobia and does not wish to take anxiolytic prior to the procedure to get it done.  We discussed  the role of  chemotherapy is with a palliative intent and not a curative intent.  Patient was previously evaluated and is not a candidate for debulking surgery.  Patient and patient's son asked about ivermectin as a treatment option.  We did discuss that there is not enough clinical data to support this.  I have reviewed the past medical history, past surgical history, social history and family history with the patient and they are unchanged from previous note.  ALLERGIES:  is allergic to aspirin, mometasone, naproxen sodium, penicillin g, triamcinolone, codeine, ibuprofen, and iodinated contrast media.  MEDICATIONS:  Current Outpatient Medications  Medication Sig Dispense Refill   acetaminophen  (TYLENOL ) 325 MG tablet Take 3 tablets (975 mg total) by mouth every 6 (six) hours as needed for moderate pain. (Patient taking differently: Take 650 mg by mouth every 6 (six) hours as needed for moderate pain (pain score 4-6).) 360 tablet 2   atorvastatin  (LIPITOR) 40 MG tablet Take 40 mg by mouth daily.     B-D ULTRAFINE III SHORT PEN 31G X 8 MM MISC SMARTSIG:1 Each SUB-Q Daily     baclofen (LIORESAL) 10 MG tablet Take 10 mg by mouth 3 (three) times daily as needed for muscle spasms.     clopidogrel  (PLAVIX ) 75 MG tablet Take 75 mg by mouth daily.     diphenhydrAMINE  (BENADRYL ) 50 MG tablet Take 1 tablet (50 mg total) by mouth once for 1 dose. Take 1 hour prior to CT scan 1 tablet 0   diphenhydrAMINE -zinc acetate (BENADRYL ) cream Apply 1 application topically 3 (three) times daily as needed for itching.     docusate sodium  (COLACE) 100 MG capsule Take 1 capsule (100 mg total) by mouth daily as needed for mild constipation. 30 capsule 1   ferrous sulfate 325 (65 FE) MG tablet Take 325 mg by mouth daily.     furosemide  (LASIX ) 20 MG tablet Take 1 tablet (20 mg total) by mouth in the morning. (Patient taking differently: Take 20 mg by mouth daily as needed for fluid.) 30 tablet 2   lidocaine -prilocaine   (EMLA ) cream Apply to affected area once 30 g 3   losartan  (COZAAR ) 50 MG tablet Take 50 mg by mouth at bedtime.     magnesium  oxide (MAG-OX) 400 (240 Mg) MG tablet Take 1 tablet (400 mg total) by mouth 2 (two) times daily. 60 tablet 6   Misc. Devices MISC Please provide patient with 3 diabetic nutritional supplements per day. 1 each 11   pantoprazole  (PROTONIX ) 20 MG tablet Take 20 mg by mouth daily.     pioglitazone (ACTOS) 15 MG tablet Take 15 mg by mouth daily.     predniSONE  (DELTASONE ) 50 MG tablet Take 13hrs, 7hrs and 1hr before port placementTake 13hrs, 7hrs and 1hr before procedure 3 tablet 0   rosuvastatin  (CRESTOR ) 20 MG tablet Take 1 tablet (20 mg total) by mouth daily. 30 tablet 2   SOLIQUA 100-33 UNT-MCG/ML SOPN Inject 35 Units into the skin daily.     Tetrahydrozoline HCl (REDNESS RELIEVER EYE DROPS OP) Place 1 drop into both eyes daily as needed (redness).     No current facility-administered medications for this visit.    REVIEW OF SYSTEMS:   Constitutional: Denies fevers, chills or abnormal weight loss Eyes: Denies blurriness of vision Ears, nose, mouth, throat, and face: Denies mucositis or sore throat Respiratory: Denies cough, dyspnea or wheezes Cardiovascular: Denies palpitation, chest discomfort or lower extremity swelling Gastrointestinal:  Denies nausea, heartburn or change in bowel habits Skin: Denies abnormal  skin rashes Lymphatics: Denies new lymphadenopathy or easy bruising Neurological:Denies numbness, tingling or new weaknesses Behavioral/Psych: Mood is stable, no new changes  All other systems were reviewed with the patient and are negative.   VITALS:  There were no vitals taken for this visit.  Wt Readings from Last 3 Encounters:  12/25/23 198 lb (89.8 kg)  12/02/23 194 lb 7.1 oz (88.2 kg)  11/16/23 194 lb 3.6 oz (88.1 kg)    There is no height or weight on file to calculate BMI.  Performance status (ECOG): 1 - Symptomatic but completely  ambulatory  PHYSICAL EXAM:   GENERAL:alert, no distress and comfortable SKIN: skin color, texture, turgor are normal, no rashes or significant lesions LYMPH:  no palpable lymphadenopathy in the cervical, axillary or inguinal LUNGS: clear to auscultation and percussion with normal breathing effort HEART: regular rate & rhythm and no murmurs and no lower extremity edema ABDOMEN:abdomen soft, non-tender and normal bowel sounds Musculoskeletal:no cyanosis of digits and no clubbing  NEURO: alert & oriented x 3 with fluent speech, no focal motor/sensory deficits  LABORATORY DATA:  I have reviewed the data as listed   Lab Results  Component Value Date   WBC 5.5 12/25/2023   NEUTROABS 3.4 12/25/2023   HGB 10.1 (L) 12/25/2023   HCT 30.7 (L) 12/25/2023   MCV 94.8 12/25/2023   PLT 212 12/25/2023      Chemistry      Component Value Date/Time   NA 135 12/16/2023 0909   K 4.4 12/16/2023 0909   CL 102 12/16/2023 0909   CO2 22 12/16/2023 0909   BUN 22 12/16/2023 0909   CREATININE 1.05 (H) 12/16/2023 0909      Component Value Date/Time   CALCIUM  9.9 12/16/2023 0909   ALKPHOS 121 12/16/2023 0909   AST 14 (L) 12/16/2023 0909   ALT 13 12/16/2023 0909   BILITOT 0.5 12/16/2023 0909       Latest Reference Range & Units 12/25/23 09:19  Cancer Antigen (CA) 125 0.0 - 38.1 U/mL 14.2   RADIOGRAPHIC STUDIES: I have personally reviewed the radiological report below  CT CHEST ABDOMEN PELVIS W CONTRAST CLINICAL DATA:  Ovarian cancer, assess treatment response status post chemotherapy oophorectomy and omentectomy. * Tracking Code: BO *  EXAM: CT CHEST, ABDOMEN, AND PELVIS WITH CONTRAST  TECHNIQUE: Multidetector CT imaging of the chest, abdomen and pelvis was performed following the standard protocol during bolus administration of intravenous contrast.  RADIATION DOSE REDUCTION: This exam was performed according to the departmental dose-optimization program which includes  automated exposure control, adjustment of the mA and/or kV according to patient size and/or use of iterative reconstruction technique.  CONTRAST:  OMNIPAQUE  IOHEXOL  300 MG/ML  SOLN  COMPARISON:  Multiple priors including CT September 02, 2023  FINDINGS: CT CHEST FINDINGS  Cardiovascular: Accessed right chest Port-A-Cath with tip near the superior cavoatrial junction. Aortic atherosclerosis. Calcifications of the mitral annulus. Coronary artery calcifications. Normal size heart. No significant pericardial effusion/thickening.  Mediastinum/Nodes: No suspicious thyroid nodule. No pathologically enlarged mediastinal, hilar or axillary lymph nodes. The esophagus is grossly unremarkable.  Lungs/Pleura: Solid 2 mm right lower lobe pulmonary nodule on image 109/3 and 2 mm subpleural pulmonary nodule in the left lower lobe on image 112/3, both are stable dating back to Jul 04, 2021 compatible with a benign finding.  No new suspicious pulmonary nodules or masses.  Musculoskeletal: No aggressive lytic or blastic lesion of bone. Thoracic spondylosis.  CT ABDOMEN PELVIS FINDINGS  Hepatobiliary: 7 mm hypodensity  in the right lobe of the liver, technically too small to accurately characterize but stable over multiple prior examinations dating back to December 18, 2021. No new suspicious hepatic lesion.  Gallbladder surgically absent.  No biliary ductal dilation.  Pancreas: No pancreatic ductal dilation or evidence of acute inflammation.  Spleen: No splenomegaly.  Adrenals/Urinary Tract: No suspicious adrenal nodule/mass. No hydronephrosis. Malrotated right kidney. Bilateral extrarenal pelvis. Subcentimeter hypodense renal lesions are technically too small to accurately characterize. Urinary bladder is unremarkable for degree of distension.  Stomach/Bowel: Radiopaque enteric contrast material traverses the hepatic flexure. Stomach is minimally distended limiting evaluation. No  pathologic dilation of small or large bowel. No evidence of acute bowel inflammation.  Vascular/Lymphatic: Aortic and branch vessel atherosclerosis. No pathologically enlarged abdominal or pelvic lymph nodes.  Reproductive: Uterus is surgically absent. Asymmetric soft tissue along the left vaginal cuff measures 2.3 x 1.3 cm on image 103/2 previously 1.9 x 1.2 cm when remeasured for consistency.  Other: No significant abdominopelvic free fluid. No discrete peritoneal or omental nodularity.  Midline vertical anterior incision scar. Shallow midline incisional hernia containing short segment of nonobstructed bowel.  Musculoskeletal: No aggressive lytic or blastic lesion of bone. Lumbar spondylosis. Diffuse demineralization of bone.  IMPRESSION: 1. Slight increase in size of the 2.3 x 1.3 cm asymmetric soft tissue along the left vaginal cuff, suspicious for local recurrent disease. Consider further evaluation by pelvic MRI. 2. No pathologically enlarged abdominal or pelvic lymph nodes. 3. No evidence of metastatic disease in the chest. 4.  Aortic Atherosclerosis (ICD10-I70.0).  Electronically Signed   By: Reyes Holder M.D.   On: 12/20/2023 10:57

## 2023-12-26 LAB — CA 125: Cancer Antigen (CA) 125: 14.2 U/mL (ref 0.0–38.1)

## 2023-12-27 ENCOUNTER — Encounter: Payer: Self-pay | Admitting: Oncology

## 2023-12-27 ENCOUNTER — Other Ambulatory Visit: Payer: Self-pay

## 2023-12-28 ENCOUNTER — Encounter: Payer: Self-pay | Admitting: Oncology

## 2023-12-29 ENCOUNTER — Other Ambulatory Visit: Payer: Self-pay | Admitting: Oncology

## 2023-12-29 DIAGNOSIS — C562 Malignant neoplasm of left ovary: Secondary | ICD-10-CM

## 2024-01-01 ENCOUNTER — Inpatient Hospital Stay

## 2024-01-01 ENCOUNTER — Other Ambulatory Visit (HOSPITAL_COMMUNITY): Payer: Self-pay | Admitting: *Deleted

## 2024-01-01 VITALS — BP 144/64 | HR 90 | Temp 97.4°F | Resp 18

## 2024-01-01 DIAGNOSIS — C562 Malignant neoplasm of left ovary: Secondary | ICD-10-CM

## 2024-01-01 DIAGNOSIS — Z95828 Presence of other vascular implants and grafts: Secondary | ICD-10-CM

## 2024-01-01 DIAGNOSIS — Z5112 Encounter for antineoplastic immunotherapy: Secondary | ICD-10-CM | POA: Diagnosis not present

## 2024-01-01 LAB — CBC WITH DIFFERENTIAL/PLATELET
Abs Immature Granulocytes: 0.03 K/uL (ref 0.00–0.07)
Basophils Absolute: 0 K/uL (ref 0.0–0.1)
Basophils Relative: 0 %
Eosinophils Absolute: 0.2 K/uL (ref 0.0–0.5)
Eosinophils Relative: 3 %
HCT: 29.4 % — ABNORMAL LOW (ref 36.0–46.0)
Hemoglobin: 9.8 g/dL — ABNORMAL LOW (ref 12.0–15.0)
Immature Granulocytes: 0 %
Lymphocytes Relative: 22 %
Lymphs Abs: 1.5 K/uL (ref 0.7–4.0)
MCH: 31.5 pg (ref 26.0–34.0)
MCHC: 33.3 g/dL (ref 30.0–36.0)
MCV: 94.5 fL (ref 80.0–100.0)
Monocytes Absolute: 0.6 K/uL (ref 0.1–1.0)
Monocytes Relative: 8 %
Neutro Abs: 4.5 K/uL (ref 1.7–7.7)
Neutrophils Relative %: 67 %
Platelets: 210 K/uL (ref 150–400)
RBC: 3.11 MIL/uL — ABNORMAL LOW (ref 3.87–5.11)
RDW: 14.5 % (ref 11.5–15.5)
WBC: 6.8 K/uL (ref 4.0–10.5)
nRBC: 0 % (ref 0.0–0.2)

## 2024-01-01 LAB — URINALYSIS, DIPSTICK ONLY
Bilirubin Urine: NEGATIVE
Glucose, UA: NEGATIVE mg/dL
Hgb urine dipstick: NEGATIVE
Ketones, ur: NEGATIVE mg/dL
Nitrite: NEGATIVE
Protein, ur: 30 mg/dL — AB
Specific Gravity, Urine: 1.006 (ref 1.005–1.030)
pH: 5 (ref 5.0–8.0)

## 2024-01-01 LAB — MAGNESIUM: Magnesium: 1.6 mg/dL — ABNORMAL LOW (ref 1.7–2.4)

## 2024-01-01 LAB — COMPREHENSIVE METABOLIC PANEL WITH GFR
ALT: 11 U/L (ref 0–44)
AST: 18 U/L (ref 15–41)
Albumin: 4 g/dL (ref 3.5–5.0)
Alkaline Phosphatase: 95 U/L (ref 38–126)
Anion gap: 11 (ref 5–15)
BUN: 12 mg/dL (ref 8–23)
CO2: 25 mmol/L (ref 22–32)
Calcium: 9.4 mg/dL (ref 8.9–10.3)
Chloride: 102 mmol/L (ref 98–111)
Creatinine, Ser: 1.18 mg/dL — ABNORMAL HIGH (ref 0.44–1.00)
GFR, Estimated: 48 mL/min — ABNORMAL LOW (ref >60)
Glucose, Bld: 164 mg/dL — ABNORMAL HIGH (ref 70–99)
Potassium: 3.8 mmol/L (ref 3.5–5.1)
Sodium: 138 mmol/L (ref 135–145)
Total Bilirubin: 0.4 mg/dL (ref 0.0–1.2)
Total Protein: 6.8 g/dL (ref 6.5–8.1)

## 2024-01-01 MED ORDER — SODIUM CHLORIDE 0.9 % IV SOLN
400.0000 mg | Freq: Once | INTRAVENOUS | Status: AC
  Administered 2024-01-01: 400 mg via INTRAVENOUS
  Filled 2024-01-01: qty 40

## 2024-01-01 MED ORDER — MAGNESIUM SULFATE 2 GM/50ML IV SOLN
2.0000 g | Freq: Once | INTRAVENOUS | Status: AC
  Administered 2024-01-01: 2 g via INTRAVENOUS
  Filled 2024-01-01: qty 50

## 2024-01-01 MED ORDER — DEXAMETHASONE SODIUM PHOSPHATE 10 MG/ML IJ SOLN
10.0000 mg | Freq: Once | INTRAMUSCULAR | Status: AC
  Administered 2024-01-01: 10 mg via INTRAVENOUS

## 2024-01-01 MED ORDER — PROCHLORPERAZINE MALEATE 10 MG PO TABS: 10.0000 mg | ORAL_TABLET | Freq: Four times a day (QID) | ORAL | 1 refills | Status: AC | PRN

## 2024-01-01 MED ORDER — CETIRIZINE HCL 10 MG/ML IV SOLN
10.0000 mg | Freq: Once | INTRAVENOUS | Status: AC
  Administered 2024-01-01: 10 mg via INTRAVENOUS
  Filled 2024-01-01: qty 1

## 2024-01-01 MED ORDER — SODIUM CHLORIDE 0.9 % IV SOLN: INTRAVENOUS | Status: DC

## 2024-01-01 MED ORDER — DEXTROSE 5 % IV SOLN: INTRAVENOUS | Status: DC

## 2024-01-01 MED ORDER — DOXORUBICIN HCL LIPOSOMAL CHEMO INJECTION 2 MG/ML
22.5000 mg/m2 | Freq: Once | INTRAVENOUS | Status: AC
  Administered 2024-01-01: 50 mg via INTRAVENOUS
  Filled 2024-01-01: qty 25

## 2024-01-01 MED ORDER — SODIUM CHLORIDE 0.9 % IV SOLN
150.0000 mg | Freq: Once | INTRAVENOUS | Status: AC
  Administered 2024-01-01: 150 mg via INTRAVENOUS
  Filled 2024-01-01: qty 150

## 2024-01-01 MED ORDER — FAMOTIDINE IN NACL 20-0.9 MG/50ML-% IV SOLN
20.0000 mg | Freq: Once | INTRAVENOUS | Status: AC
  Administered 2024-01-01: 20 mg via INTRAVENOUS
  Filled 2024-01-01: qty 50

## 2024-01-01 MED ORDER — SODIUM CHLORIDE 0.9 % IV SOLN
15.0000 mg/kg | Freq: Once | INTRAVENOUS | Status: AC
  Administered 2024-01-01: 1300 mg via INTRAVENOUS
  Filled 2024-01-01: qty 48

## 2024-01-01 MED ORDER — PALONOSETRON HCL INJECTION 0.25 MG/5ML
0.2500 mg | Freq: Once | INTRAVENOUS | Status: AC
  Administered 2024-01-01: 0.25 mg via INTRAVENOUS
  Filled 2024-01-01: qty 5

## 2024-01-01 NOTE — Progress Notes (Signed)
 Patient presents today for chemotherapy Vegzelma /Doxorubicin /Carboplatin  infusion.  Patient is in satisfactory condition with no new complaints voiced.  Vital signs are stable.  Labs reviewed and all labs are within treatment parameters. Patient will receive 2g IV magnesium  sulfate per Dr.Kandala's standing orders. We will proceed with treatment per MD orders.    Treatment given today per MD orders. Tolerated infusion without adverse affects. Vital signs stable. No complaints at this time. Discharged from clinic ambulatory in stable condition. Alert and oriented x 3. F/U with [facility] as scheduled.

## 2024-01-01 NOTE — Patient Instructions (Signed)
 [facility] [place] - A DEPT OF [place]  Discharge Instructions: Thank you for choosing [facility] to provide your oncology and hematology care.  If you have a lab appointment with the Cancer Center - please note that after April 8th, 2024, all labs will be drawn in the cancer center.  You do not have to check in or register with the main entrance as you have in the past but will complete your check-in in the cancer center.  Wear comfortable clothing and clothing appropriate for easy access to any Portacath or PICC line.   We strive to give you quality time with your provider. You may need to reschedule your appointment if you arrive late (15 or more minutes).  Arriving late affects you and other patients whose appointments are after yours.  Also, if you miss three or more appointments without notifying the office, you may be dismissed from the clinic at the provider's discretion.      For prescription refill requests, have your pharmacy contact our office and allow 72 hours for refills to be completed.    Today you received the following chemotherapy and/or immunotherapy agents Vegzelma /Doxorubicin /Carboplatin /2g IV magnesium  sulfate      To help prevent nausea and vomiting after your treatment, we encourage you to take your nausea medication as directed.  BELOW ARE SYMPTOMS THAT SHOULD BE REPORTED IMMEDIATELY: *FEVER GREATER THAN 100.4 F (38 C) OR HIGHER *CHILLS OR SWEATING *NAUSEA AND VOMITING THAT IS NOT CONTROLLED WITH YOUR NAUSEA MEDICATION *UNUSUAL SHORTNESS OF BREATH *UNUSUAL BRUISING OR BLEEDING *URINARY PROBLEMS (pain or burning when urinating, or frequent urination) *BOWEL PROBLEMS (unusual diarrhea, constipation, pain near the anus) TENDERNESS IN MOUTH AND THROAT WITH OR WITHOUT PRESENCE OF ULCERS (sore throat, sores in mouth, or a toothache) UNUSUAL RASH, SWELLING OR PAIN  UNUSUAL VAGINAL DISCHARGE OR ITCHING   Items with * indicate  a potential emergency and should be followed up as soon as possible or go to the Emergency Department if any problems should occur.  Please show the CHEMOTHERAPY ALERT CARD or IMMUNOTHERAPY ALERT CARD at check-in to the Emergency Department and triage nurse.  Should you have questions after your visit or need to cancel or reschedule your appointment, please contact [facility] [place] - A DEPT OF [name] [phone]  and follow the prompts.  Office hours are 8:00 a.m. to 4:30 p.m. Monday - Friday. Please note that voicemails left after 4:00 p.m. may not be returned until the following business day.  We are closed weekends and major holidays. You have access to a nurse at all times for urgent questions. Please call the main number to the clinic [phone] and follow the prompts.  For any non-urgent questions, you may also contact your provider using MyChart. We now offer e-Visits for anyone 58 and older to request care online for non-urgent symptoms. For details visit [email or web address].   Also download the MyChart app! Go to the app store, search MyChart, open the app, select [place], and log in with your MyChart username and password.

## 2024-01-01 NOTE — Progress Notes (Signed)
 Restarting Doxil  and Carboplatin  with bevacizumab  today at previous doses.  Orders reviewed with appropriate premedication.  Reviewed by: Niels Molt, PharmD

## 2024-01-02 ENCOUNTER — Other Ambulatory Visit: Payer: Self-pay

## 2024-01-05 ENCOUNTER — Other Ambulatory Visit (HOSPITAL_COMMUNITY): Payer: Self-pay

## 2024-01-05 ENCOUNTER — Telehealth: Payer: Self-pay | Admitting: Pharmacy Technician

## 2024-01-05 ENCOUNTER — Encounter: Payer: Self-pay | Admitting: Oncology

## 2024-01-05 NOTE — Telephone Encounter (Signed)
 Oral Oncology Patient Advocate Encounter  After completing a benefits investigation, prior authorization for prochlorperazine  is not required at this time through [facility].  Patient's copay is $0.40.     Djeneba Barsch (Patty) Chet Burnet, CPhT  [facility], [place]da [place], [facility] - Oral Chemotherapy Patient Advocate Specialist III Phone: [phone]  Fax: [phone]

## 2024-01-13 ENCOUNTER — Encounter: Payer: Self-pay | Admitting: Oncology

## 2024-01-13 ENCOUNTER — Ambulatory Visit: Admitting: Oncology

## 2024-01-13 ENCOUNTER — Inpatient Hospital Stay

## 2024-01-15 ENCOUNTER — Inpatient Hospital Stay

## 2024-01-15 ENCOUNTER — Ambulatory Visit: Admitting: Oncology

## 2024-01-24 NOTE — Progress Notes (Signed)
 " Patient Care Team: [name], [name], NP as PCP - General (Nurse Practitioner) Rebecca [name], RN as Oncology Nurse Navigator (Oncology) Rebecca [name], NP as Nurse Practitioner (Cardiothoracic Surgery)  Clinic Day:  01/24/2024  Referring physician: [name]   CHIEF COMPLAINT:  CC: Stage IIIb high-grade serous ovarian carcinoma   ASSESSMENT & PLAN:   Assessment & Plan: Rebecca [name]  is a 78 y.o. female with high-grade serous ovarian carcinoma   Assessment and Plan Assessment & Plan Recurrent high-grade serous ovarian carcinoma.   Extensive oncology history below Patient completed 6 cycles of carboplatin  and Doxil  and is currently on maintenance bevacizumab  Last CT scan from 12/2023 showed progression of omental nodule Restarted on Chemotherapy along with bevacizumab    - Patient reports fatigue from chemotherapy, otherwise stable. - We reviewed labs today: CMP: Creatinine: 1.08-stable, normal LFTs, normal potassium and magnesium .  CBC: WBC: 3.2, ANC: 1300, hemoglobin: 7.6, platelets: 112 - Physical exam stable today.  Will proceed with chemotherapy today - Continue carboplatin  and Doxil , bevacizumab  every 3 weeks - Will repeat CT scan after completion of 3 cycles of treatment.  Due in 6 weeks. - Patient does not wish to do MRI for claustrophobia.  Does not want to try even with anxiolytic. -Reemphasized side effects of chemotherapy again.Carboplatin  commonly causes myelosuppression (anemia, neutropenia, thrombocytopenia), nausea, and hypersensitivity reactions, while pegylated liposomal doxorubicin  can lead to hand-foot syndrome, mucositis, myelosuppression, and cardiotoxicity.  Return to clinic in 6 weeks with CT scan to assess the response for treatment   Chemotherapy-induced anemia Anemia secondary to chemotherapy contributing to fatigue and malaise. Blood counts low but not critical.  Patient is symptomatic with anemia.  - Will  administer 1 unit of packed red blood cells today. - Will add anemia panel with next blood draw  Chemotherapy-induced neutropenia Neutropenia secondary to chemotherapy with non-critical white blood cell count drop. Previous injections beneficial.  -Will start on G-CSF post chemotherapy  Chemotherapy-induced peripheral neuropathy Numbness and tingling in hands and feet due to chemotherapy-induced peripheral neuropathy. Previously managed with gabapentin .  - Restarted gabapentin  300 mg at night.  Urinary tract infection Persistent urinary tract infection with inadequate response to previous antibiotics-nitrofurantoin  UA with significant leukocytosis  -Patient reported significant improvement with ciprofloxacin  previously. - Will prescribe ciprofloxacin  500 mg  orally once daily for 5 days.  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 Ultrasound leg negative for DVT  - Improved at this time.   The patient understands the plans discussed today and is in agreement with them.  She knows to contact our office if she develops concerns prior to her next appointment.  The total time spent in the appointment was 40 minutes for the encounter with patient, including review of chart and various tests results, discussions about plan of care and coordination of care plan   Rebecca Dry, MD  [facility] [place] - A DEPT OF [facility] [address] [place] [address] Dept: [phone] Dept Fax: [phone]   No orders of the defined types were placed in this encounter.    ONCOLOGY HISTORY:   I have reviewed her chart and materials related to her cancer extensively and collaborated history with the patient. Summary of oncologic history is as follows:   Diagnosis: Stage IIIb high-grade serous ovarian carcinoma   -Presentation: Abdominal  distention and bloating sensation -12/05/2020: CT abdomen: Very large [at least 23  x17x 18 cm] heterogeneous mass in the lower abdomen and pelvis.  Lesion demonstrates cystic and solid components.  Solid components measure up to at least 18 x 14 cm.  The lesion compresses the urinary bladder and is most suspicious for ovarian malignancy until proven otherwise. -12/05/2020: CA 125: 10,000 -12/18/2020: CT chest: Scattered solid pulmonary nodules measuring up to 3 mm, indeterminate.  -12/19/2020: Exploratory laparotomy, bilateral salpingo-oophorectomy, infra gastric omentectomy, R0 primary tumor debulking.  -Pathology: Left ovary and fallopian tube positive for high grade serous adenocarcinoma (26.5 cm), involving tubal fimbriae, ovarian surface, and ovarian parenchyma. Omentum positive for high grade serous adenocarcinoma (1.0 cm, measured microscopically), involving fibroadipose tissue. Right ovary and fallopian tube negative for tumor. Tumor cells are p53 positive, p16 positive, and WT-1 positive. Staging: pT3b. FIGO Stage: IIIB.  -Cytology of peritoneal washing negative for malignancy.  -Germline mutation testing: negative (as per documentation) -02/11/2021-05/28/2021: 6 cycles of carboplatin  and paclitaxel   -05/28/2021-06/24/2022: Patient refused niraparib  maintenance and remained under surveillance with imaging showing stable to NED -06/24/2022: CT AP: 1.5 x 1.4 cm soft tissue nodule along the left pelvic sidewall adjacent to the left vaginal cuff is new in the interval. 6 mm soft tissue nodule in the anterior left subdiaphragmatic fat just above the splenic flexure is stable in the interval. -11/10/2022: CT AP: Interval increase in size of soft tissue nodule along the left pelvic sidewall abutting the left posterior wall of the bladder. Findings are concerning for progressive metastatic disease.Stable small peritoneal nodule within the left upper quadrant of the abdomen. -11/24/2022: Patient  evaluated by Dr. Eloy [gynecologic oncology at [facility]] and felt not to be a candidate for debulking surgery -12/04/2022: Her2/neu IHC analysis: negative (1+) involving 12% of cells. -01/12/2023-06/16/2023: 6 cycles of carboplatin  and doxil   -05/12/2023: CT AP: Diminished size of a soft tissue nodule at the left aspect of the vaginal cuff, consistent with treatment response. Unchanged small nodule just anteriorly. Unchanged small peritoneal nodule in the left upper quadrant adjacent to the splenic flexure. No new evidence of lymphadenopathy or metastatic disease in the abdomen or pelvis. -06/15/2023: FOLR1 IHC analysis: Negative involving <1% of cells.  -07/23/2023-current: Maintenance bevacizumab   -09/02/2023: CT AP with contrast: Stable exam since 05/12/2023. No acute intra-abdominal or pelvic pathology. Aortic Atherosclerosis. -11/16/2023: CT Head without contrast: Mild chronic small vessel disease throughout the deep white matter. No acute intracranial abnormality. -11/16/2023: MR Brain w/wo contrast: No acute intracranial abnormality. Mild chronic microvascular ischemic changes and mild parenchymal volume loss. 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 - 12/16/2023: CT CAP: Slight increase in size of the 2.3 x 1.3 cm asymmetric soft tissue along the left vaginal cuff, suspicious for local recurrent disease.No pathologically enlarged abdominal or pelvic lymph nodes. No evidence of metastatic disease in the chest. -01/01/2024- Current: Carboplatin  + Doxil  + bevacizumab  (restarted chemotherapy for progressive disease)  Current Treatment: Carboplatin + Doxil + bevacizumab   INTERVAL HISTORY:   Discussed the use of AI scribe software for clinical note transcription with the patient, who gave verbal consent to proceed.  History of Present Illness Rebecca [name] is a 78 year old female with ovarian carcinoma presents today for follow-up and  treatment.    She has been feeling unwell for the past two weeks, experiencing a lack of energy and motivation.   She experiences numbness and tingling in her hands and feet. She has not been taking any medication for this issue recently but has used gabapentin  before.  She has a history of urinary tract infections and is currently experiencing symptoms again. She previously took Cipro , which was effective, but the current antibiotics-nitrofurantoin  are not alleviating her symptoms. She had a urine test done this morning.    I have reviewed the past medical history, past surgical history, social history and family history with the patient and they are unchanged from previous note.  ALLERGIES:  is allergic to aspirin, mometasone, naproxen sodium, penicillin g, triamcinolone, codeine, ibuprofen, and iodinated contrast media.  MEDICATIONS:  Current Outpatient Medications  Medication Sig Dispense Refill   acetaminophen  (TYLENOL ) 325 MG tablet Take 3 tablets (975 mg total) by mouth every 6 (six) hours as needed for moderate pain. (Patient taking differently: Take 650 mg by mouth every 6 (six) hours as needed for moderate pain (pain score 4-6).) 360 tablet 2   atorvastatin  (LIPITOR) 40 MG tablet Take 40 mg by mouth daily.     B-D ULTRAFINE III SHORT PEN 31G X 8 MM MISC SMARTSIG:1 Each SUB-Q Daily     baclofen (LIORESAL) 10 MG tablet Take 10 mg by mouth 3 (three) times daily as needed for muscle spasms.     clopidogrel  (PLAVIX ) 75 MG tablet Take 75 mg by mouth daily.     diphenhydrAMINE  (BENADRYL ) 50 MG tablet Take 1 tablet (50 mg total) by mouth once for 1 dose. Take 1 hour prior to CT scan 1 tablet 0   diphenhydrAMINE -zinc acetate (BENADRYL ) cream Apply 1 application topically 3 (three) times daily as needed for itching.     docusate sodium  (COLACE) 100 MG capsule Take 1 capsule (100 mg total) by mouth daily as needed for mild constipation. 30 capsule 1   ferrous sulfate 325 (65 FE) MG tablet  Take 325 mg by mouth daily.     furosemide  (LASIX ) 20 MG tablet Take 1 tablet (20 mg total) by mouth in the morning. (Patient taking differently: Take 20 mg by mouth daily as needed for fluid.) 30 tablet 2   lidocaine -prilocaine  (EMLA ) cream Apply to affected area once 30 g 3   losartan  (COZAAR ) 50 MG tablet Take 50 mg by mouth at bedtime.     magnesium  oxide (MAG-OX) 400 (240 Mg) MG tablet Take 1 tablet (400 mg total) by mouth 2 (two) times daily. 60 tablet 6   Misc. Devices MISC Please provide patient with 3 diabetic nutritional supplements per day. 1 each 11   pantoprazole  (PROTONIX ) 20 MG tablet Take 20 mg by mouth daily.     pioglitazone (ACTOS) 15 MG tablet Take 15 mg by mouth daily.     predniSONE  (DELTASONE ) 50 MG tablet Take 13hrs, 7hrs and 1hr before port placementTake 13hrs, 7hrs and 1hr before procedure 3 tablet 0   prochlorperazine  (COMPAZINE ) 10 MG tablet Take 1 tablet (10 mg total) by mouth every 6 (six) hours as needed (Nausea or vomiting). 30 tablet 1   rosuvastatin  (CRESTOR ) 20 MG tablet Take 1 tablet (20 mg total) by mouth daily. 30 tablet 2   SOLIQUA 100-33 UNT-MCG/ML SOPN Inject 35 Units into the skin daily.     Tetrahydrozoline HCl (REDNESS RELIEVER EYE DROPS OP) Place 1 drop into both eyes daily as needed (redness).     No current facility-administered medications for this visit.    REVIEW OF SYSTEMS:   Constitutional: Denies fevers, chills or abnormal weight loss Eyes: Denies blurriness of vision Ears, nose, mouth, throat, and face: Denies mucositis or sore throat Respiratory: Denies cough, dyspnea or wheezes Cardiovascular: Denies palpitation, chest discomfort  or lower extremity swelling Gastrointestinal:  Denies nausea, heartburn or change in bowel habits Skin: Denies abnormal skin rashes Lymphatics: Denies new lymphadenopathy or easy bruising Neurological:Denies numbness, tingling or new weaknesses Behavioral/Psych: Mood is stable, no new changes   All other  systems were reviewed with the patient and are negative.   VITALS:  There were no vitals taken for this visit.  Wt Readings from Last 3 Encounters:  01/01/24 200 lb 2.8 oz (90.8 kg)  12/25/23 198 lb (89.8 kg)  12/02/23 194 lb 7.1 oz (88.2 kg)    There is no height or weight on file to calculate BMI.  Performance status (ECOG): 1 - Symptomatic but completely ambulatory  PHYSICAL EXAM:   GENERAL:alert, no distress and comfortable SKIN: skin color, texture, turgor are normal, no rashes or significant lesions LYMPH:  no palpable lymphadenopathy in the cervical, axillary or inguinal LUNGS: clear to auscultation and percussion with normal breathing effort HEART: regular rate & rhythm and no murmurs and no lower extremity edema ABDOMEN:Hernia +, non tender Musculoskeletal:no cyanosis of digits and no clubbing  NEURO: alert & oriented x 3 with fluent speech  LABORATORY DATA:  I have reviewed the data as listed   Lab Results  Component Value Date   WBC 6.8 01/01/2024   NEUTROABS 4.5 01/01/2024   HGB 9.8 (L) 01/01/2024   HCT 29.4 (L) 01/01/2024   MCV 94.5 01/01/2024   PLT 210 01/01/2024      Chemistry      Component Value Date/Time   NA 138 01/01/2024 0800   K 3.8 01/01/2024 0800   CL 102 01/01/2024 0800   CO2 25 01/01/2024 0800   BUN 12 01/01/2024 0800   CREATININE 1.18 (H) 01/01/2024 0800      Component Value Date/Time   CALCIUM  9.4 01/01/2024 0800   ALKPHOS 95 01/01/2024 0800   AST 18 01/01/2024 0800   ALT 11 01/01/2024 0800   BILITOT 0.4 01/01/2024 0800       Latest Reference Range & Units 12/25/23 09:19  Cancer Antigen (CA) 125 0.0 - 38.1 U/mL 14.2    Latest Reference Range & Units 01/25/24 08:16  Ferritin 11 - 307 ng/mL 789 (H)  (H): Data is abnormally high  RADIOGRAPHIC STUDIES: I have personally reviewed the radiological report below  "

## 2024-01-25 ENCOUNTER — Inpatient Hospital Stay

## 2024-01-25 ENCOUNTER — Other Ambulatory Visit: Payer: Self-pay

## 2024-01-25 ENCOUNTER — Inpatient Hospital Stay: Attending: Hematology | Admitting: Oncology

## 2024-01-25 ENCOUNTER — Encounter: Payer: Self-pay | Admitting: Oncology

## 2024-01-25 ENCOUNTER — Other Ambulatory Visit: Payer: Self-pay | Admitting: *Deleted

## 2024-01-25 VITALS — Wt 199.0 lb

## 2024-01-25 VITALS — BP 157/76 | HR 106 | Temp 98.3°F | Resp 20

## 2024-01-25 DIAGNOSIS — N390 Urinary tract infection, site not specified: Secondary | ICD-10-CM | POA: Insufficient documentation

## 2024-01-25 DIAGNOSIS — D6481 Anemia due to antineoplastic chemotherapy: Secondary | ICD-10-CM | POA: Diagnosis not present

## 2024-01-25 DIAGNOSIS — C562 Malignant neoplasm of left ovary: Secondary | ICD-10-CM | POA: Diagnosis not present

## 2024-01-25 DIAGNOSIS — T451X5A Adverse effect of antineoplastic and immunosuppressive drugs, initial encounter: Secondary | ICD-10-CM | POA: Insufficient documentation

## 2024-01-25 DIAGNOSIS — D696 Thrombocytopenia, unspecified: Secondary | ICD-10-CM | POA: Insufficient documentation

## 2024-01-25 DIAGNOSIS — Z79899 Other long term (current) drug therapy: Secondary | ICD-10-CM | POA: Insufficient documentation

## 2024-01-25 DIAGNOSIS — D649 Anemia, unspecified: Secondary | ICD-10-CM

## 2024-01-25 DIAGNOSIS — Z5112 Encounter for antineoplastic immunotherapy: Secondary | ICD-10-CM | POA: Diagnosis present

## 2024-01-25 LAB — URINALYSIS, DIPSTICK ONLY
Bilirubin Urine: NEGATIVE
Glucose, UA: NEGATIVE mg/dL
Hgb urine dipstick: NEGATIVE
Ketones, ur: NEGATIVE mg/dL
Nitrite: NEGATIVE
Protein, ur: 30 mg/dL — AB
Specific Gravity, Urine: 1.009 (ref 1.005–1.030)
pH: 6 (ref 5.0–8.0)

## 2024-01-25 LAB — COMPREHENSIVE METABOLIC PANEL WITH GFR
ALT: 13 U/L (ref 0–44)
AST: 15 U/L (ref 15–41)
Albumin: 4 g/dL (ref 3.5–5.0)
Alkaline Phosphatase: 97 U/L (ref 38–126)
Anion gap: 12 (ref 5–15)
BUN: 20 mg/dL (ref 8–23)
CO2: 23 mmol/L (ref 22–32)
Calcium: 9.4 mg/dL (ref 8.9–10.3)
Chloride: 102 mmol/L (ref 98–111)
Creatinine, Ser: 1.08 mg/dL — ABNORMAL HIGH (ref 0.44–1.00)
GFR, Estimated: 53 mL/min — ABNORMAL LOW (ref >60)
Glucose, Bld: 116 mg/dL — ABNORMAL HIGH (ref 70–99)
Potassium: 4.2 mmol/L (ref 3.5–5.1)
Sodium: 137 mmol/L (ref 135–145)
Total Bilirubin: 0.4 mg/dL (ref 0.0–1.2)
Total Protein: 6.8 g/dL (ref 6.5–8.1)

## 2024-01-25 LAB — CBC WITH DIFFERENTIAL/PLATELET
Abs Immature Granulocytes: 0.03 K/uL (ref 0.00–0.07)
Basophils Absolute: 0 K/uL (ref 0.0–0.1)
Basophils Relative: 0 %
Eosinophils Absolute: 0 K/uL (ref 0.0–0.5)
Eosinophils Relative: 0 %
HCT: 23.6 % — ABNORMAL LOW (ref 36.0–46.0)
Hemoglobin: 7.6 g/dL — ABNORMAL LOW (ref 12.0–15.0)
Immature Granulocytes: 1 %
Lymphocytes Relative: 42 %
Lymphs Abs: 1.3 K/uL (ref 0.7–4.0)
MCH: 31.9 pg (ref 26.0–34.0)
MCHC: 32.2 g/dL (ref 30.0–36.0)
MCV: 99.2 fL (ref 80.0–100.0)
Monocytes Absolute: 0.5 K/uL (ref 0.1–1.0)
Monocytes Relative: 16 %
Neutro Abs: 1.3 K/uL — ABNORMAL LOW (ref 1.7–7.7)
Neutrophils Relative %: 41 %
Platelets: 112 K/uL — ABNORMAL LOW (ref 150–400)
RBC: 2.38 MIL/uL — ABNORMAL LOW (ref 3.87–5.11)
RDW: 17.7 % — ABNORMAL HIGH (ref 11.5–15.5)
WBC: 3.2 K/uL — ABNORMAL LOW (ref 4.0–10.5)
nRBC: 0 % (ref 0.0–0.2)

## 2024-01-25 LAB — MAGNESIUM: Magnesium: 1.8 mg/dL (ref 1.7–2.4)

## 2024-01-25 LAB — ABO/RH: ABO/RH(D): O NEG

## 2024-01-25 LAB — PREPARE RBC (CROSSMATCH)

## 2024-01-25 LAB — FERRITIN: Ferritin: 789 ng/mL — ABNORMAL HIGH (ref 11–307)

## 2024-01-25 MED ORDER — SODIUM CHLORIDE 0.9 % IV SOLN
400.0000 mg | Freq: Once | INTRAVENOUS | Status: AC
  Administered 2024-01-25: 400 mg via INTRAVENOUS
  Filled 2024-01-25: qty 40

## 2024-01-25 MED ORDER — SODIUM CHLORIDE 0.9 % IV SOLN
15.0000 mg/kg | Freq: Once | INTRAVENOUS | Status: AC
  Administered 2024-01-25: 1300 mg via INTRAVENOUS
  Filled 2024-01-25: qty 48

## 2024-01-25 MED ORDER — DEXTROSE 5 % IV SOLN: INTRAVENOUS | Status: DC

## 2024-01-25 MED ORDER — SODIUM CHLORIDE 0.9% IV SOLUTION
250.0000 mL | INTRAVENOUS | Status: DC
  Administered 2024-01-25: 250 mL via INTRAVENOUS

## 2024-01-25 MED ORDER — DIPHENHYDRAMINE HCL 25 MG PO CAPS: 25.0000 mg | ORAL_CAPSULE | Freq: Once | ORAL | Status: DC

## 2024-01-25 MED ORDER — CIPROFLOXACIN HCL 500 MG PO TABS: 500.0000 mg | ORAL_TABLET | Freq: Two times a day (BID) | ORAL | 0 refills | Status: DC

## 2024-01-25 MED ORDER — SODIUM CHLORIDE 0.9 % IV SOLN
150.0000 mg | Freq: Once | INTRAVENOUS | Status: AC
  Administered 2024-01-25: 150 mg via INTRAVENOUS
  Filled 2024-01-25: qty 150

## 2024-01-25 MED ORDER — DOXORUBICIN HCL LIPOSOMAL CHEMO INJECTION 2 MG/ML
22.5000 mg/m2 | Freq: Once | INTRAVENOUS | Status: AC
  Administered 2024-01-25: 50 mg via INTRAVENOUS
  Filled 2024-01-25: qty 25

## 2024-01-25 MED ORDER — CETIRIZINE HCL 10 MG/ML IV SOLN
10.0000 mg | Freq: Once | INTRAVENOUS | Status: AC
  Administered 2024-01-25: 10 mg via INTRAVENOUS
  Filled 2024-01-25: qty 1

## 2024-01-25 MED ORDER — SODIUM CHLORIDE 0.9 % IV SOLN: INTRAVENOUS | Status: DC

## 2024-01-25 MED ORDER — PREDNISONE 50 MG PO TABS: ORAL_TABLET | ORAL | 0 refills | Status: DC

## 2024-01-25 MED ORDER — DEXAMETHASONE SODIUM PHOSPHATE 10 MG/ML IJ SOLN
10.0000 mg | Freq: Once | INTRAMUSCULAR | Status: AC
  Administered 2024-01-25: 10 mg via INTRAVENOUS

## 2024-01-25 MED ORDER — GABAPENTIN 300 MG PO CAPS: 300.0000 mg | ORAL_CAPSULE | Freq: Every day | ORAL | 1 refills | Status: AC

## 2024-01-25 MED ORDER — FAMOTIDINE IN NACL 20-0.9 MG/50ML-% IV SOLN
20.0000 mg | Freq: Once | INTRAVENOUS | Status: AC
  Administered 2024-01-25: 20 mg via INTRAVENOUS
  Filled 2024-01-25: qty 50

## 2024-01-25 MED ORDER — PALONOSETRON HCL INJECTION 0.25 MG/5ML
0.2500 mg | Freq: Once | INTRAVENOUS | Status: AC
  Administered 2024-01-25: 0.25 mg via INTRAVENOUS
  Filled 2024-01-25: qty 5

## 2024-01-25 MED ORDER — ACETAMINOPHEN 325 MG PO TABS
650.0000 mg | ORAL_TABLET | Freq: Once | ORAL | Status: AC
  Administered 2024-01-25: 650 mg via ORAL
  Filled 2024-01-25: qty 2

## 2024-01-25 MED ORDER — CIPROFLOXACIN HCL 500 MG PO TABS: 500.0000 mg | ORAL_TABLET | Freq: Every day | ORAL | 0 refills | Status: AC

## 2024-01-25 NOTE — Patient Instructions (Signed)

## 2024-01-25 NOTE — Progress Notes (Signed)
 Patient has been examined by Dr. Davonna. Vital signs and labs have been reviewed by MD - ANC (1.3), Creatinine, LFTs, hemoglobin (7.6), and platelets have been reviewed by M.D. - pt may proceed with treatment. Will transfuse 1 unit PRBC. Primary RN and pharmacy notified.

## 2024-01-25 NOTE — Progress Notes (Signed)
 Patient presents today for chemotherapy Vegzelma /Doxorubicin /Carboplatin   infusion. Patient is in satisfactory condition with no new complaints voiced.  Vital signs are stable.  Labs reviewed by Dr. Davonna during the office visit and all other labs are within treatment parameters. Patient's hemoglobin noted to be 7.6 and ANC 1.3 . Patient will receive 1 unit of blood per Dr.Kandala's standing orders. We will proceed with treatment per MD orders.   Treatment given today per MD orders. Tolerated infusion without adverse affects. Vital signs stable. No complaints at this time. Discharged from clinic ambulatory in stable condition. Alert and oriented x 3. F/U with [facility] as scheduled.

## 2024-01-25 NOTE — Patient Instructions (Signed)
 [facility] [place] - A DEPT OF [facility]  Discharge Instructions: Thank you for choosing [facility] to provide your oncology and hematology care.  If you have a lab appointment with the Cancer Center - please note that after April 8th, 2024, all labs will be drawn in the cancer center.  You do not have to check in or register with the main entrance as you have in the past but will complete your check-in in the cancer center.  Wear comfortable clothing and clothing appropriate for easy access to any Portacath or PICC line.   We strive to give you quality time with your provider. You may need to reschedule your appointment if you arrive late (15 or more minutes).  Arriving late affects you and other patients whose appointments are after yours.  Also, if you miss three or more appointments without notifying the office, you may be dismissed from the clinic at the provider's discretion.      For prescription refill requests, have your pharmacy contact our office and allow 72 hours for refills to be completed.    Today you received the following chemotherapy and/or immunotherapy agents Vegzelma /Doxorubicin /Carboplatin       To help prevent nausea and vomiting after your treatment, we encourage you to take your nausea medication as directed.  BELOW ARE SYMPTOMS THAT SHOULD BE REPORTED IMMEDIATELY: *FEVER GREATER THAN 100.4 F (38 C) OR HIGHER *CHILLS OR SWEATING *NAUSEA AND VOMITING THAT IS NOT CONTROLLED WITH YOUR NAUSEA MEDICATION *UNUSUAL SHORTNESS OF BREATH *UNUSUAL BRUISING OR BLEEDING *URINARY PROBLEMS (pain or burning when urinating, or frequent urination) *BOWEL PROBLEMS (unusual diarrhea, constipation, pain near the anus) TENDERNESS IN MOUTH AND THROAT WITH OR WITHOUT PRESENCE OF ULCERS (sore throat, sores in mouth, or a toothache) UNUSUAL RASH, SWELLING OR PAIN  UNUSUAL VAGINAL DISCHARGE OR ITCHING   Items with * indicate a potential emergency  and should be followed up as soon as possible or go to the Emergency Department if any problems should occur.  Please show the CHEMOTHERAPY ALERT CARD or IMMUNOTHERAPY ALERT CARD at check-in to the Emergency Department and triage nurse.  Should you have questions after your visit or need to cancel or reschedule your appointment, please contact [facility]CER [facility] [place] - A DEPT OF [facility] [phone]  and follow the prompts.  Office hours are 8:00 a.m. to 4:30 p.m. Monday - Friday. Please note that voicemails left after 4:00 p.m. may not be returned until the following business day.  We are closed weekends and major holidays. You have access to a nurse at all times for urgent questions. Please call the main number to the clinic [phone] and follow the prompts.  For any non-urgent questions, you may also contact your provider using MyChart. We now offer e-Visits for anyone 80 and older to request care online for non-urgent symptoms. For details visit [email or web address].   Also download the MyChart app! Go to the app store, search MyChart, open the app, select [place], and log in with your MyChart username and password.

## 2024-01-26 LAB — BPAM RBC
Blood Product Expiration Date: 202512182359
ISSUE DATE / TIME: 202511241425
Unit Type and Rh: 9500

## 2024-01-26 LAB — TYPE AND SCREEN
ABO/RH(D): O NEG
Antibody Screen: NEGATIVE
Unit division: 0

## 2024-01-26 LAB — CA 125: Cancer Antigen (CA) 125: 14.9 U/mL (ref 0.0–38.1)

## 2024-01-27 ENCOUNTER — Inpatient Hospital Stay

## 2024-01-27 ENCOUNTER — Other Ambulatory Visit: Payer: Self-pay

## 2024-01-27 VITALS — BP 140/61 | HR 98 | Temp 97.3°F | Resp 16

## 2024-01-27 DIAGNOSIS — C562 Malignant neoplasm of left ovary: Secondary | ICD-10-CM

## 2024-01-27 DIAGNOSIS — Z5112 Encounter for antineoplastic immunotherapy: Secondary | ICD-10-CM | POA: Diagnosis not present

## 2024-01-27 MED ORDER — PEGFILGRASTIM-FPGK 6 MG/0.6ML ~~LOC~~ SOSY
6.0000 mg | PREFILLED_SYRINGE | Freq: Once | SUBCUTANEOUS | Status: AC
  Administered 2024-01-27: 6 mg via SUBCUTANEOUS
  Filled 2024-01-27: qty 0.6

## 2024-01-27 NOTE — Progress Notes (Signed)
 [facility] presents today for Stimufend  injection per the provider's orders.  Stable during administration without incident; injection site WNL; see MAR for injection details.  Patient tolerated procedure well and without incident.  No questions or complaints noted at this time.

## 2024-01-27 NOTE — Patient Instructions (Signed)
 [facility] [place] - A DEPT OF [facility]  Discharge Instructions: Thank you for choosing [facility] to provide your oncology and hematology care.  If you have a lab appointment with the Cancer Center - please note that after April 8th, 2024, all labs will be drawn in the cancer center.  You do not have to check in or register with the main entrance as you have in the past but will complete your check-in in the cancer center.  Wear comfortable clothing and clothing appropriate for easy access to any Portacath or PICC line.   We strive to give you quality time with your provider. You may need to reschedule your appointment if you arrive late (15 or more minutes).  Arriving late affects you and other patients whose appointments are after yours.  Also, if you miss three or more appointments without notifying the office, you may be dismissed from the clinic at the provider's discretion.      For prescription refill requests, have your pharmacy contact our office and allow 72 hours for refills to be completed.    Today you received the following chemotherapy and/or immunotherapy agents Stimufend      To help prevent nausea and vomiting after your treatment, we encourage you to take your nausea medication as directed.  BELOW ARE SYMPTOMS THAT SHOULD BE REPORTED IMMEDIATELY: *FEVER GREATER THAN 100.4 F (38 C) OR HIGHER *CHILLS OR SWEATING *NAUSEA AND VOMITING THAT IS NOT CONTROLLED WITH YOUR NAUSEA MEDICATION *UNUSUAL SHORTNESS OF BREATH *UNUSUAL BRUISING OR BLEEDING *URINARY PROBLEMS (pain or burning when urinating, or frequent urination) *BOWEL PROBLEMS (unusual diarrhea, constipation, pain near the anus) TENDERNESS IN MOUTH AND THROAT WITH OR WITHOUT PRESENCE OF ULCERS (sore throat, sores in mouth, or a toothache) UNUSUAL RASH, SWELLING OR PAIN  UNUSUAL VAGINAL DISCHARGE OR ITCHING   Items with * indicate a potential emergency and should be followed up  as soon as possible or go to the Emergency Department if any problems should occur.  Please show the CHEMOTHERAPY ALERT CARD or IMMUNOTHERAPY ALERT CARD at check-in to the Emergency Department and triage nurse.  Should you have questions after your visit or need to cancel or reschedule your appointment, please contact [facility]CER CTR [place] - A DEPT OF [facility] [phone]  and follow the prompts.  Office hours are 8:00 a.m. to 4:30 p.m. Monday - Friday. Please note that voicemails left after 4:00 p.m. may not be returned until the following business day.  We are closed weekends and major holidays. You have access to a nurse at all times for urgent questions. Please call the main number to the clinic [phone] and follow the prompts.  For any non-urgent questions, you may also contact your provider using MyChart. We now offer e-Visits for anyone 3 and older to request care online for non-urgent symptoms. For details visit [email or web address].   Also download the MyChart app! Go to the app store, search "MyChart", open the app, select [place], and log in with your MyChart username and password.

## 2024-02-09 ENCOUNTER — Other Ambulatory Visit: Payer: Self-pay | Admitting: Oncology

## 2024-02-09 DIAGNOSIS — C562 Malignant neoplasm of left ovary: Secondary | ICD-10-CM

## 2024-02-15 ENCOUNTER — Inpatient Hospital Stay

## 2024-02-15 ENCOUNTER — Encounter: Payer: Self-pay | Admitting: Oncology

## 2024-02-15 ENCOUNTER — Other Ambulatory Visit: Payer: Self-pay | Admitting: Oncology

## 2024-02-15 ENCOUNTER — Inpatient Hospital Stay: Attending: Hematology

## 2024-02-15 VITALS — BP 148/69 | HR 102 | Temp 97.7°F | Resp 18 | Wt 199.1 lb

## 2024-02-15 DIAGNOSIS — C562 Malignant neoplasm of left ovary: Secondary | ICD-10-CM | POA: Diagnosis present

## 2024-02-15 DIAGNOSIS — Z79899 Other long term (current) drug therapy: Secondary | ICD-10-CM | POA: Insufficient documentation

## 2024-02-15 DIAGNOSIS — Z5112 Encounter for antineoplastic immunotherapy: Secondary | ICD-10-CM | POA: Insufficient documentation

## 2024-02-15 LAB — CBC WITH DIFFERENTIAL/PLATELET
Abs Immature Granulocytes: 0.08 K/uL — ABNORMAL HIGH (ref 0.00–0.07)
Basophils Absolute: 0 K/uL (ref 0.0–0.1)
Basophils Relative: 0 %
Eosinophils Absolute: 0 K/uL (ref 0.0–0.5)
Eosinophils Relative: 0 %
HCT: 26.1 % — ABNORMAL LOW (ref 36.0–46.0)
Hemoglobin: 8.4 g/dL — ABNORMAL LOW (ref 12.0–15.0)
Immature Granulocytes: 1 %
Lymphocytes Relative: 26 %
Lymphs Abs: 1.8 K/uL (ref 0.7–4.0)
MCH: 32.4 pg (ref 26.0–34.0)
MCHC: 32.2 g/dL (ref 30.0–36.0)
MCV: 100.8 fL — ABNORMAL HIGH (ref 80.0–100.0)
Monocytes Absolute: 1 K/uL (ref 0.1–1.0)
Monocytes Relative: 14 %
Neutro Abs: 4.1 K/uL (ref 1.7–7.7)
Neutrophils Relative %: 59 %
Platelets: 112 K/uL — ABNORMAL LOW (ref 150–400)
RBC: 2.59 MIL/uL — ABNORMAL LOW (ref 3.87–5.11)
RDW: 19.9 % — ABNORMAL HIGH (ref 11.5–15.5)
WBC: 7 K/uL (ref 4.0–10.5)
nRBC: 0 % (ref 0.0–0.2)

## 2024-02-15 LAB — COMPREHENSIVE METABOLIC PANEL WITH GFR
ALT: 18 U/L (ref 0–44)
AST: 21 U/L (ref 15–41)
Albumin: 4.1 g/dL (ref 3.5–5.0)
Alkaline Phosphatase: 115 U/L (ref 38–126)
Anion gap: 8 (ref 5–15)
BUN: 17 mg/dL (ref 8–23)
CO2: 29 mmol/L (ref 22–32)
Calcium: 9.5 mg/dL (ref 8.9–10.3)
Chloride: 105 mmol/L (ref 98–111)
Creatinine, Ser: 1.12 mg/dL — ABNORMAL HIGH (ref 0.44–1.00)
GFR, Estimated: 51 mL/min — ABNORMAL LOW (ref >60)
Glucose, Bld: 93 mg/dL (ref 70–99)
Potassium: 4.4 mmol/L (ref 3.5–5.1)
Sodium: 142 mmol/L (ref 135–145)
Total Bilirubin: 0.4 mg/dL (ref 0.0–1.2)
Total Protein: 6.9 g/dL (ref 6.5–8.1)

## 2024-02-15 LAB — URINALYSIS, DIPSTICK ONLY
Bilirubin Urine: NEGATIVE
Glucose, UA: NEGATIVE mg/dL
Ketones, ur: NEGATIVE mg/dL
Nitrite: NEGATIVE
Protein, ur: 30 mg/dL — AB
Specific Gravity, Urine: 1.008 (ref 1.005–1.030)
pH: 7 (ref 5.0–8.0)

## 2024-02-15 LAB — FERRITIN: Ferritin: 1013 ng/mL — ABNORMAL HIGH (ref 11–307)

## 2024-02-15 LAB — MAGNESIUM: Magnesium: 1.9 mg/dL (ref 1.7–2.4)

## 2024-02-15 MED ORDER — FAMOTIDINE IN NACL 20-0.9 MG/50ML-% IV SOLN
20.0000 mg | Freq: Once | INTRAVENOUS | Status: AC
  Administered 2024-02-15: 10:00:00 20 mg via INTRAVENOUS
  Filled 2024-02-15: qty 50

## 2024-02-15 MED ORDER — SODIUM CHLORIDE 0.9 % IV SOLN
150.0000 mg | Freq: Once | INTRAVENOUS | Status: AC
  Administered 2024-02-15: 10:00:00 150 mg via INTRAVENOUS
  Filled 2024-02-15: qty 150

## 2024-02-15 MED ORDER — DEXAMETHASONE SODIUM PHOSPHATE 10 MG/ML IJ SOLN
10.0000 mg | Freq: Once | INTRAMUSCULAR | Status: AC
  Administered 2024-02-15: 10:00:00 10 mg via INTRAVENOUS

## 2024-02-15 MED ORDER — SODIUM CHLORIDE 0.9 % IV SOLN
15.0000 mg/kg | Freq: Once | INTRAVENOUS | Status: AC
  Administered 2024-02-15: 14:00:00 1300 mg via INTRAVENOUS
  Filled 2024-02-15: qty 48

## 2024-02-15 MED ORDER — CETIRIZINE HCL 10 MG/ML IV SOLN
10.0000 mg | Freq: Once | INTRAVENOUS | Status: AC
  Administered 2024-02-15: 10:00:00 10 mg via INTRAVENOUS
  Filled 2024-02-15: qty 1

## 2024-02-15 MED ORDER — SODIUM CHLORIDE 0.9 % IV SOLN
400.0000 mg | Freq: Once | INTRAVENOUS | Status: AC
  Administered 2024-02-15: 13:00:00 400 mg via INTRAVENOUS
  Filled 2024-02-15: qty 40

## 2024-02-15 MED ORDER — DEXTROSE 5 % IV SOLN: Freq: Once | INTRAVENOUS | Status: AC

## 2024-02-15 MED ORDER — DOXORUBICIN HCL LIPOSOMAL CHEMO INJECTION 2 MG/ML
22.5000 mg/m2 | Freq: Once | INTRAVENOUS | Status: AC
  Administered 2024-02-15: 11:00:00 50 mg via INTRAVENOUS
  Filled 2024-02-15: qty 25

## 2024-02-15 MED ORDER — PALONOSETRON HCL INJECTION 0.25 MG/5ML
0.2500 mg | Freq: Once | INTRAVENOUS | Status: AC
  Administered 2024-02-15: 10:00:00 0.25 mg via INTRAVENOUS
  Filled 2024-02-15: qty 5

## 2024-02-15 MED ORDER — SODIUM CHLORIDE 0.9 % IV SOLN: INTRAVENOUS | Status: DC

## 2024-02-15 NOTE — Progress Notes (Signed)
 Patient presents today for chemotherapy infusion Doxorubcin HCL Liposomal, Carboplatin , and Vegzelma .  Patient is in satisfactory condition with no new complaints voiced.  Vital signs are stable.  Labs reviewed and all labs are within treatment parameters.  We will proceed with treatment per MD orders.

## 2024-02-15 NOTE — Patient Instructions (Signed)
 [facility] [place] - A DEPT OF [place]  Discharge Instructions: Thank you for choosing [facility] to provide your oncology and hematology care.  If you have a lab appointment with the Cancer Center - please note that after April 8th, 2024, all labs will be drawn in the cancer center.  You do not have to check in or register with the main entrance as you have in the past but will complete your check-in in the cancer center.  Wear comfortable clothing and clothing appropriate for easy access to any Portacath or PICC line.   We strive to give you quality time with your provider. You may need to reschedule your appointment if you arrive late (15 or more minutes).  Arriving late affects you and other patients whose appointments are after yours.  Also, if you miss three or more appointments without notifying the office, you may be dismissed from the clinic at the providers discretion.      For prescription refill requests, have your pharmacy contact our office and allow 72 hours for refills to be completed.    Today you received the following chemotherapy and/or immunotherapy agents Doxorubicin ,  Carboplatin , and Vegzelma .  Doxorubicin  Liposomal Injection What is this medication? DOXORUBICIN  LIPOSOMAL (dox oh ROO bi sin LIP oh som al) treats some types of cancer. It works by slowing down the growth of cancer cells. This medicine may be used for other purposes; ask your health care provider or pharmacist if you have questions. COMMON BRAND NAME(S): Doxil , Lipodox What should I tell my care team before I take this medication? They need to know if you have any of these conditions: Blood disorder Heart disease Infection especially a viral infection, such as chickenpox, cold sores, herpes Liver disease Recent or ongoing radiation An unusual or allergic reaction to doxorubicin , soybeans, other medications, foods, dyes, or preservatives If you or your partner  are pregnant or trying to get pregnant Breast-feeding How should I use this medication? This medication is injected into a vein. It is given by your care team in a hospital or clinic setting. Talk to your care team about the use of this medication in children. Special care may be needed. Overdosage: If you think you have taken too much of this medicine contact a poison control center or emergency room at once. NOTE: This medicine is only for you. Do not share this medicine with others. What if I miss a dose? Keep appointments for follow-up doses. It is important not to miss your dose. Call your care team if you are unable to keep an appointment. What may interact with this medication? Do not take this medication with any of the following: Zidovudine This medication may also interact with the following: Medications to increase blood counts, such as filgrastim, pegfilgrastim , sargramostim Vaccines This list may not describe all possible interactions. Give your health care provider a list of all the medicines, herbs, non-prescription drugs, or dietary supplements you use. Also tell them if you smoke, drink alcohol, or use illegal drugs. Some items may interact with your medicine. What should I watch for while using this medication? Your condition will be monitored carefully while you are receiving this medication. You may need blood work while taking this medication. This medication may make you feel generally unwell. This is not uncommon as chemotherapy can affect healthy cells as well as cancer cells. Report any side effects. Continue your course of treatment even though you feel ill  unless your care team tells you to stop. Your urine may turn orange-red for a few days after your dose. This is not blood. If your urine is dark or brown, call your care team. In some cases, you may be given additional medications to help with side effects. Follow all directions for their use. Talk to your care team  about your risk of cancer. You may be more at risk for certain types of cancers if you take this medication. Talk to your care team if you or your partner may be pregnant. Serious birth defects can occur if you take this medication during pregnancy and for 6 months after the last dose. Contraception is recommended while taking this medication and for 6 months after the last dose. Your care team can help you find the option that works for you. If your partner can get pregnant, use a condom while taking this medication and for 6 months after the last dose. Do not breastfeed while taking this medication. This medication may cause infertility. Talk to your care team if you are concerned about your fertility. What side effects may I notice from receiving this medication? Side effects that you should report to your care team as soon as possible: Allergic reactions--skin rash, itching, hives, swelling of the face, lips, tongue, or throat Heart failure--shortness of breath, swelling of the ankles, feet, or hands, sudden weight gain, unusual weakness or fatigue Infection--fever, chills, cough, sore throat, wounds that don't heal, pain or trouble when passing urine, general feeling of discomfort or being unwell Infusion reactions--chest pain, shortness of breath or trouble breathing, feeling faint or lightheaded Low red blood cell level--unusual weakness or fatigue, dizziness, headache, trouble breathing Redness, swelling, and blistering of the skin over hands and feet Unusual bruising or bleeding Side effects that usually do not require medical attention (report to your care team if they continue or are bothersome): Constipation Diarrhea Loss of appetite Nausea Pain, redness, or swelling with sores inside the mouth or throat Red urine Unusual weakness or fatigue This list may not describe all possible side effects. Call your doctor for medical advice about side effects. You may report side effects to  FDA at 1-800-FDA-1088. Where should I keep my medication? This medication is given in a hospital or clinic. It will not be stored at home. NOTE: This sheet is a summary. It may not cover all possible information. If you have questions about this medicine, talk to your doctor, pharmacist, or health care provider.  2024 Elsevier/Gold Standard (2022-05-22 00:00:00)  Carboplatin  Injection What is this medication? CARBOPLATIN  (KAR boe pla tin) treats some types of cancer. It works by slowing down the growth of cancer cells. This medicine may be used for other purposes; ask your health care provider or pharmacist if you have questions. COMMON BRAND NAME(S): Paraplatin  What should I tell my care team before I take this medication? They need to know if you have any of these conditions: Blood disorders Hearing problems Kidney disease Recent or ongoing radiation therapy An unusual or allergic reaction to carboplatin , cisplatin, other medications, foods, dyes, or preservatives Pregnant or trying to get pregnant Breast-feeding How should I use this medication? This medication is injected into a vein. It is given by your care team in a hospital or clinic setting. Talk to your care team about the use of this medication in children. Special care may be needed. Overdosage: If you think you have taken too much of this medicine contact a poison control center or  emergency room at once. NOTE: This medicine is only for you. Do not share this medicine with others. What if I miss a dose? Keep appointments for follow-up doses. It is important not to miss your dose. Call your care team if you are unable to keep an appointment. What may interact with this medication? Medications for seizures Some antibiotics, such as amikacin, gentamicin, neomycin, streptomycin, tobramycin Vaccines This list may not describe all possible interactions. Give your health care provider a list of all the medicines, herbs,  non-prescription drugs, or dietary supplements you use. Also tell them if you smoke, drink alcohol, or use illegal drugs. Some items may interact with your medicine. What should I watch for while using this medication? Your condition will be monitored carefully while you are receiving this medication. You may need blood work while taking this medication. This medication may make you feel generally unwell. This is not uncommon, as chemotherapy can affect healthy cells as well as cancer cells. Report any side effects. Continue your course of treatment even though you feel ill unless your care team tells you to stop. In some cases, you may be given additional medications to help with side effects. Follow all directions for their use. This medication may increase your risk of getting an infection. Call your care team for advice if you get a fever, chills, sore throat, or other symptoms of a cold or flu. Do not treat yourself. Try to avoid being around people who are sick. Avoid taking medications that contain aspirin, acetaminophen , ibuprofen, naproxen, or ketoprofen unless instructed by your care team. These medications may hide a fever. Be careful brushing or flossing your teeth or using a toothpick because you may get an infection or bleed more easily. If you have any dental work done, tell your dentist you are receiving this medication. Talk to your care team if you wish to become pregnant or think you might be pregnant. This medication can cause serious birth defects. Talk to your care team about effective forms of contraception. Do not breast-feed while taking this medication. What side effects may I notice from receiving this medication? Side effects that you should report to your care team as soon as possible: Allergic reactions--skin rash, itching, hives, swelling of the face, lips, tongue, or throat Infection--fever, chills, cough, sore throat, wounds that don't heal, pain or trouble when passing  urine, general feeling of discomfort or being unwell Low red blood cell level--unusual weakness or fatigue, dizziness, headache, trouble breathing Pain, tingling, or numbness in the hands or feet, muscle weakness, change in vision, confusion or trouble speaking, loss of balance or coordination, trouble walking, seizures Unusual bruising or bleeding Side effects that usually do not require medical attention (report to your care team if they continue or are bothersome): Hair loss Nausea Unusual weakness or fatigue Vomiting This list may not describe all possible side effects. Call your doctor for medical advice about side effects. You may report side effects to FDA at 1-800-FDA-1088. Where should I keep my medication? This medication is given in a hospital or clinic. It will not be stored at home. NOTE: This sheet is a summary. It may not cover all possible information. If you have questions about this medicine, talk to your doctor, pharmacist, or health care provider.  2024 Elsevier/Gold Standard (2021-06-11 00:00:00)  Bevacizumab  Injection What is this medication? BEVACIZUMAB  (be va SIZ yoo mab) treats some types of cancer. It works by blocking a protein that causes cancer cells to grow and  multiply. This helps to slow or stop the spread of cancer cells. It is a monoclonal antibody. This medicine may be used for other purposes; ask your health care provider or pharmacist if you have questions. COMMON BRAND NAME(S): Alymsys , Avastin , MVASI , Vegzalma, Zirabev  What should I tell my care team before I take this medication? They need to know if you have any of these conditions: Blood clots Coughing up blood Having or recent surgery Heart failure High blood pressure History of a connection between 2 or more body parts that do not usually connect (fistula) History of a tear in your stomach or intestines Protein in your urine An unusual or allergic reaction to bevacizumab , other medications,  foods, dyes, or preservatives Pregnant or trying to get pregnant Breast-feeding How should I use this medication? This medication is injected into a vein. It is given by your care team in a hospital or clinic setting. Talk to your care team the use of this medication in children. Special care may be needed. Overdosage: If you think you have taken too much of this medicine contact a poison control center or emergency room at once. NOTE: This medicine is only for you. Do not share this medicine with others. What if I miss a dose? Keep appointments for follow-up doses. It is important not to miss your dose. Call your care team if you are unable to keep an appointment. What may interact with this medication? Interactions are not expected. This list may not describe all possible interactions. Give your health care provider a list of all the medicines, herbs, non-prescription drugs, or dietary supplements you use. Also tell them if you smoke, drink alcohol, or use illegal drugs. Some items may interact with your medicine. What should I watch for while using this medication? Your condition will be monitored carefully while you are receiving this medication. You may need blood work while taking this medication. This medication may make you feel generally unwell. This is not uncommon as chemotherapy can affect healthy cells as well as cancer cells. Report any side effects. Continue your course of treatment even though you feel ill unless your care team tells you to stop. This medication may increase your risk to bruise or bleed. Call your care team if you notice any unusual bleeding. Before having surgery, talk to your care team to make sure it is ok. This medication can increase the risk of poor healing of your surgical site or wound. You will need to stop this medication for 28 days before surgery. After surgery, wait at least 28 days before restarting this medication. Make sure the surgical site or wound  is healed enough before restarting this medication. Talk to your care team if questions. Talk to your care team if you may be pregnant. Serious birth defects can occur if you take this medication during pregnancy and for 6 months after the last dose. Contraception is recommended while taking this medication and for 6 months after the last dose. Your care team can help you find the option that works for you. Do not breastfeed while taking this medication and for 6 months after the last dose. This medication can cause infertility. Talk to your care team if you are concerned about your fertility. What side effects may I notice from receiving this medication? Side effects that you should report to your care team as soon as possible: Allergic reactions--skin rash, itching, hives, swelling of the face, lips, tongue, or throat Bleeding--bloody or black, tar-like stools, vomiting blood  or brown material that looks like coffee grounds, red or dark brown urine, small red or purple spots on skin, unusual bruising or bleeding Blood clot--pain, swelling, or warmth in the leg, shortness of breath, chest pain Heart attack--pain or tightness in the chest, shoulders, arms, or jaw, nausea, shortness of breath, cold or clammy skin, feeling faint or lightheaded Heart failure--shortness of breath, swelling of the ankles, feet, or hands, sudden weight gain, unusual weakness or fatigue Increase in blood pressure Infection--fever, chills, cough, sore throat, wounds that don't heal, pain or trouble when passing urine, general feeling of discomfort or being unwell Infusion reactions--chest pain, shortness of breath or trouble breathing, feeling faint or lightheaded Kidney injury--decrease in the amount of urine, swelling of the ankles, hands, or feet Stomach pain that is severe, does not go away, or gets worse Stroke--sudden numbness or weakness of the face, arm, or leg, trouble speaking, confusion, trouble walking, loss of  balance or coordination, dizziness, severe headache, change in vision Sudden and severe headache, confusion, change in vision, seizures, which may be signs of posterior reversible encephalopathy syndrome (PRES) Side effects that usually do not require medical attention (report to your care team if they continue or are bothersome): Back pain Change in taste Diarrhea Dry skin Increased tears Nosebleed This list may not describe all possible side effects. Call your doctor for medical advice about side effects. You may report side effects to FDA at 1-800-FDA-1088. Where should I keep my medication? This medication is given in a hospital or clinic. It will not be stored at home. NOTE: This sheet is a summary. It may not cover all possible information. If you have questions about this medicine, talk to your doctor, pharmacist, or health care provider.  2024 Elsevier/Gold Standard (2021-07-05 00:00:00)   To help prevent nausea and vomiting after your treatment, we encourage you to take your nausea medication as directed.  BELOW ARE SYMPTOMS THAT SHOULD BE REPORTED IMMEDIATELY: *FEVER GREATER THAN 100.4 F (38 C) OR HIGHER *CHILLS OR SWEATING *NAUSEA AND VOMITING THAT IS NOT CONTROLLED WITH YOUR NAUSEA MEDICATION *UNUSUAL SHORTNESS OF BREATH *UNUSUAL BRUISING OR BLEEDING *URINARY PROBLEMS (pain or burning when urinating, or frequent urination) *BOWEL PROBLEMS (unusual diarrhea, constipation, pain near the anus) TENDERNESS IN MOUTH AND THROAT WITH OR WITHOUT PRESENCE OF ULCERS (sore throat, sores in mouth, or a toothache) UNUSUAL RASH, SWELLING OR PAIN  UNUSUAL VAGINAL DISCHARGE OR ITCHING   Items with * indicate a potential emergency and should be followed up as soon as possible or go to the Emergency Department if any problems should occur.  Please show the CHEMOTHERAPY ALERT CARD or IMMUNOTHERAPY ALERT CARD at check-in to the Emergency Department and triage nurse.  Should you have  questions after your visit or need to cancel or reschedule your appointment, please contact [facility] [place] - A DEPT OF [facility] [phone]  and follow the prompts.  Office hours are 8:00 a.m. to 4:30 p.m. Monday - Friday. Please note that voicemails left after 4:00 p.m. may not be returned until the following business day.  We are closed weekends and major holidays. You have access to a nurse at all times for urgent questions. Please call the main number to the clinic [phone] and follow the prompts.  For any non-urgent questions, you may also contact your provider using MyChart. We now offer e-Visits for anyone 45 and older to request care online for non-urgent symptoms. For details visit [email or web address].   Also  download the MyChart app! Go to the app store, search MyChart, open the app, select [facility], and log in with your MyChart username and password.

## 2024-02-15 NOTE — Progress Notes (Signed)
Treatment given per orders. Patient tolerated it well without problems. Vitals stable and discharged home from clinic ambulatory. Follow up as scheduled.  

## 2024-02-15 NOTE — Progress Notes (Signed)
 Proceed with Chemotherapy today - Bevacizumab  at 15mg /kg Patient next appointment will be approximately 03/14/24.  V.O. Dr Ivery Molt, PharmD

## 2024-02-16 ENCOUNTER — Other Ambulatory Visit: Payer: Self-pay

## 2024-02-16 ENCOUNTER — Encounter: Payer: Self-pay | Admitting: Oncology

## 2024-02-16 LAB — CA 125: Cancer Antigen (CA) 125: 16.6 U/mL (ref 0.0–38.1)

## 2024-02-17 ENCOUNTER — Inpatient Hospital Stay

## 2024-02-17 VITALS — BP 127/58 | HR 89 | Temp 97.0°F | Resp 20

## 2024-02-17 DIAGNOSIS — C562 Malignant neoplasm of left ovary: Secondary | ICD-10-CM

## 2024-02-17 DIAGNOSIS — Z5112 Encounter for antineoplastic immunotherapy: Secondary | ICD-10-CM | POA: Diagnosis not present

## 2024-02-17 MED ORDER — PEGFILGRASTIM-FPGK 6 MG/0.6ML ~~LOC~~ SOSY
6.0000 mg | PREFILLED_SYRINGE | Freq: Once | SUBCUTANEOUS | Status: AC
  Administered 2024-02-17: 10:00:00 6 mg via SUBCUTANEOUS
  Filled 2024-02-17: qty 0.6

## 2024-02-17 NOTE — Progress Notes (Unsigned)
 Jonica Bickhart presents today for injection per the provider's orders.  STIMUFEND  administration without incident; injection site WNL; see MAR for injection details.  Patient tolerated procedure well and without incident.  No questions or concerns noted. Discharged from clinic ambulatory in stable condition. Alert and oriented x 3. F/U with [facility] as scheduled.

## 2024-02-17 NOTE — Patient Instructions (Signed)
 [facility] [place] - A DEPT OF [facility]  Discharge Instructions: Thank you for choosing [facility] to provide your oncology and hematology care.  If you have a lab appointment with the Cancer Center - please note that after April 8th, 2024, all labs will be drawn in the cancer center.  You do not have to check in or register with the main entrance as you have in the past but will complete your check-in in the cancer center.  Wear comfortable clothing and clothing appropriate for easy access to any Portacath or PICC line.   We strive to give you quality time with your provider. You may need to reschedule your appointment if you arrive late (15 or more minutes).  Arriving late affects you and other patients whose appointments are after yours.  Also, if you miss three or more appointments without notifying the office, you may be dismissed from the clinic at the provider's discretion.      For prescription refill requests, have your pharmacy contact our office and allow 72 hours for refills to be completed.    Today you received the following chemotherapy and/or immunotherapy agents Stimufend.  Pegfilgrastim Injection What is this medication? PEGFILGRASTIM (PEG fil gra stim) lowers the risk of infection in people who are receiving chemotherapy. It works by Systems analyst make more white blood cells, which protects your body from infection. It may also be used to help people who have been exposed to high doses of radiation. This medicine may be used for other purposes; ask your health care provider or pharmacist if you have questions. COMMON BRAND NAME(S): Cherly Hensen, Neulasta, Nyvepria, Stimufend, UDENYCA, UDENYCA ONBODY, Ziextenzo What should I tell my care team before I take this medication? They need to know if you have any of these conditions: Kidney disease Latex allergy Ongoing radiation therapy Sickle cell disease Skin reactions to  acrylic adhesives (On-Body Injector only) An unusual or allergic reaction to pegfilgrastim, filgrastim, other medications, foods, dyes, or preservatives Pregnant or trying to get pregnant Breast-feeding How should I use this medication? This medication is for injection under the skin. If you get this medication at home, you will be taught how to prepare and give the pre-filled syringe or how to use the On-body Injector. Refer to the patient Instructions for Use for detailed instructions. Use exactly as directed. Tell your care team immediately if you suspect that the On-body Injector may not have performed as intended or if you suspect the use of the On-body Injector resulted in a missed or partial dose. It is important that you put your used needles and syringes in a special sharps container. Do not put them in a trash can. If you do not have a sharps container, call your pharmacist or care team to get one. Talk to your care team about the use of this medication in children. While this medication may be prescribed for selected conditions, precautions do apply. Overdosage: If you think you have taken too much of this medicine contact a poison control center or emergency room at once. NOTE: This medicine is only for you. Do not share this medicine with others. What if I miss a dose? It is important not to miss your dose. Call your care team if you miss your dose. If you miss a dose due to an On-body Injector failure or leakage, a new dose should be administered as soon as possible using a single prefilled syringe for  manual use. What may interact with this medication? Interactions have not been studied. This list may not describe all possible interactions. Give your health care provider a list of all the medicines, herbs, non-prescription drugs, or dietary supplements you use. Also tell them if you smoke, drink alcohol, or use illegal drugs. Some items may interact with your medicine. What should I  watch for while using this medication? Your condition will be monitored carefully while you are receiving this medication. You may need blood work done while you are taking this medication. Talk to your care team about your risk of cancer. You may be more at risk for certain types of cancer if you take this medication. If you are going to need a MRI, CT scan, or other procedure, tell your care team that you are using this medication (On-Body Injector only). What side effects may I notice from receiving this medication? Side effects that you should report to your care team as soon as possible: Allergic reactions--skin rash, itching, hives, swelling of the face, lips, tongue, or throat Capillary leak syndrome--stomach or muscle pain, unusual weakness or fatigue, feeling faint or lightheaded, decrease in the amount of urine, swelling of the ankles, hands, or feet, trouble breathing High white blood cell level--fever, fatigue, trouble breathing, night sweats, change in vision, weight loss Inflammation of the aorta--fever, fatigue, back, chest, or stomach pain, severe headache Kidney injury (glomerulonephritis)--decrease in the amount of urine, red or dark Joannah Gitlin urine, foamy or bubbly urine, swelling of the ankles, hands, or feet Shortness of breath or trouble breathing Spleen injury--pain in upper left stomach or shoulder Unusual bruising or bleeding Side effects that usually do not require medical attention (report to your care team if they continue or are bothersome): Bone pain Pain in the hands or feet This list may not describe all possible side effects. Call your doctor for medical advice about side effects. You may report side effects to FDA at 1-800-FDA-1088. Where should I keep my medication? Keep out of the reach of children. If you are using this medication at home, you will be instructed on how to store it. Throw away any unused medication after the expiration date on the label. NOTE:  This sheet is a summary. It may not cover all possible information. If you have questions about this medicine, talk to your doctor, pharmacist, or health care provider.  2024 Elsevier/Gold Standard (2021-01-18 00:00:00)        To help prevent nausea and vomiting after your treatment, we encourage you to take your nausea medication as directed.  BELOW ARE SYMPTOMS THAT SHOULD BE REPORTED IMMEDIATELY: *FEVER GREATER THAN 100.4 F (38 C) OR HIGHER *CHILLS OR SWEATING *NAUSEA AND VOMITING THAT IS NOT CONTROLLED WITH YOUR NAUSEA MEDICATION *UNUSUAL SHORTNESS OF BREATH *UNUSUAL BRUISING OR BLEEDING *URINARY PROBLEMS (pain or burning when urinating, or frequent urination) *BOWEL PROBLEMS (unusual diarrhea, constipation, pain near the anus) TENDERNESS IN MOUTH AND THROAT WITH OR WITHOUT PRESENCE OF ULCERS (sore throat, sores in mouth, or a toothache) UNUSUAL RASH, SWELLING OR PAIN  UNUSUAL VAGINAL DISCHARGE OR ITCHING   Items with * indicate a potential emergency and should be followed up as soon as possible or go to the Emergency Department if any problems should occur.  Please show the CHEMOTHERAPY ALERT CARD or IMMUNOTHERAPY ALERT CARD at check-in to the Emergency Department and triage nurse.  Should you have questions after your visit or need to cancel or reschedule your appointment, please contact [facility] [place] -  A DEPT OF Eligha Bridegroom [facility] [phone]  and follow the prompts.  Office hours are 8:00 a.m. to 4:30 p.m. Monday - Friday. Please note that voicemails left after 4:00 p.m. may not be returned until the following business day.  We are closed weekends and major holidays. You have access to a nurse at all times for urgent questions. Please call the main number to the clinic [phone] and follow the prompts.  For any non-urgent questions, you may also contact your provider using MyChart. We now offer e-Visits for anyone 41 and older to request care online for  non-urgent symptoms. For details visit [email or web address].   Also download the MyChart app! Go to the app store, search "MyChart", open the app, select [facility], and log in with your MyChart username and password.

## 2024-02-22 ENCOUNTER — Telehealth: Payer: Self-pay | Admitting: *Deleted

## 2024-02-22 ENCOUNTER — Other Ambulatory Visit: Payer: Self-pay | Admitting: *Deleted

## 2024-02-22 ENCOUNTER — Inpatient Hospital Stay

## 2024-02-22 VITALS — BP 160/69 | HR 90 | Temp 96.9°F | Resp 20

## 2024-02-22 DIAGNOSIS — C562 Malignant neoplasm of left ovary: Secondary | ICD-10-CM

## 2024-02-22 DIAGNOSIS — D62 Acute posthemorrhagic anemia: Secondary | ICD-10-CM

## 2024-02-22 DIAGNOSIS — Z5112 Encounter for antineoplastic immunotherapy: Secondary | ICD-10-CM | POA: Diagnosis not present

## 2024-02-22 DIAGNOSIS — D649 Anemia, unspecified: Secondary | ICD-10-CM

## 2024-02-22 LAB — CBC WITH DIFFERENTIAL/PLATELET
Abs Immature Granulocytes: 0.17 K/uL — ABNORMAL HIGH (ref 0.00–0.07)
Basophils Absolute: 0.1 K/uL (ref 0.0–0.1)
Basophils Relative: 1 %
Eosinophils Absolute: 0 K/uL (ref 0.0–0.5)
Eosinophils Relative: 0 %
HCT: 24 % — ABNORMAL LOW (ref 36.0–46.0)
Hemoglobin: 7.8 g/dL — ABNORMAL LOW (ref 12.0–15.0)
Immature Granulocytes: 1 %
Lymphocytes Relative: 10 %
Lymphs Abs: 1.5 K/uL (ref 0.7–4.0)
MCH: 33.5 pg (ref 26.0–34.0)
MCHC: 32.5 g/dL (ref 30.0–36.0)
MCV: 103 fL — ABNORMAL HIGH (ref 80.0–100.0)
Monocytes Absolute: 1 K/uL (ref 0.1–1.0)
Monocytes Relative: 7 %
Neutro Abs: 11.7 K/uL — ABNORMAL HIGH (ref 1.7–7.7)
Neutrophils Relative %: 81 %
Platelets: 158 K/uL (ref 150–400)
RBC: 2.33 MIL/uL — ABNORMAL LOW (ref 3.87–5.11)
RDW: 20.3 % — ABNORMAL HIGH (ref 11.5–15.5)
WBC: 14.4 K/uL — ABNORMAL HIGH (ref 4.0–10.5)
nRBC: 0 % (ref 0.0–0.2)

## 2024-02-22 LAB — COMPREHENSIVE METABOLIC PANEL WITH GFR
ALT: 16 U/L (ref 0–44)
AST: 20 U/L (ref 15–41)
Albumin: 3.8 g/dL (ref 3.5–5.0)
Alkaline Phosphatase: 147 U/L — ABNORMAL HIGH (ref 38–126)
Anion gap: 13 (ref 5–15)
BUN: 22 mg/dL (ref 8–23)
CO2: 20 mmol/L — ABNORMAL LOW (ref 22–32)
Calcium: 9 mg/dL (ref 8.9–10.3)
Chloride: 105 mmol/L (ref 98–111)
Creatinine, Ser: 0.98 mg/dL (ref 0.44–1.00)
GFR, Estimated: 60 mL/min — ABNORMAL LOW
Glucose, Bld: 89 mg/dL (ref 70–99)
Potassium: 4.6 mmol/L (ref 3.5–5.1)
Sodium: 139 mmol/L (ref 135–145)
Total Bilirubin: 0.4 mg/dL (ref 0.0–1.2)
Total Protein: 6.8 g/dL (ref 6.5–8.1)

## 2024-02-22 LAB — PREPARE RBC (CROSSMATCH)

## 2024-02-22 LAB — MAGNESIUM: Magnesium: 1.6 mg/dL — ABNORMAL LOW (ref 1.7–2.4)

## 2024-02-22 LAB — SAMPLE TO BLOOD BANK

## 2024-02-22 MED ORDER — DIPHENHYDRAMINE HCL 25 MG PO CAPS
25.0000 mg | ORAL_CAPSULE | Freq: Once | ORAL | Status: AC
  Administered 2024-02-22: 25 mg via ORAL
  Filled 2024-02-22: qty 1

## 2024-02-22 MED ORDER — SODIUM CHLORIDE 0.9% IV SOLUTION
250.0000 mL | INTRAVENOUS | Status: DC
  Administered 2024-02-22: 100 mL via INTRAVENOUS

## 2024-02-22 MED ADMIN — Acetaminophen Tab 325 MG: 650 mg | ORAL | NDC 00904677361

## 2024-02-22 MED FILL — Acetaminophen Tab 325 MG: 650.0000 mg | ORAL | Qty: 2 | Status: AC

## 2024-02-22 NOTE — Progress Notes (Signed)
 Hemoglobin is 7.8 will give one unit of blood per protocol. MD made aware.

## 2024-02-22 NOTE — Telephone Encounter (Signed)
 Patient called and was obviously anxious over the phone, sob and states she has no energy and does not feel well. Advised she may need to be evaluated in ER.  Denies other associated symptoms such as bleeding.  Will schedule for labs today.

## 2024-02-22 NOTE — Progress Notes (Signed)
 Patient tolerated transfusion with no complaints voiced.  Side effects with management reviewed with understanding verbalized.  Port site clean and dry with no bruising or swelling noted at site.  Good blood return noted before and after administration.  Band aid applied.  Patient left in satisfactory condition with VSS and no s/s of distress noted.

## 2024-02-22 NOTE — Patient Instructions (Signed)

## 2024-02-22 NOTE — Telephone Encounter (Signed)
 Opened in error

## 2024-02-23 LAB — BPAM RBC
Blood Product Expiration Date: 202601102359
ISSUE DATE / TIME: 202512221410
Unit Type and Rh: 9500

## 2024-02-23 LAB — TYPE AND SCREEN
ABO/RH(D): O NEG
Antibody Screen: NEGATIVE
Unit division: 0

## 2024-02-29 ENCOUNTER — Inpatient Hospital Stay

## 2024-02-29 ENCOUNTER — Ambulatory Visit (HOSPITAL_COMMUNITY)
Admission: RE | Admit: 2024-02-29 | Discharge: 2024-02-29 | Disposition: A | Discharge status: Home / Self Care | Source: Ambulatory Visit | Attending: Oncology | Admitting: Oncology

## 2024-02-29 DIAGNOSIS — D649 Anemia, unspecified: Secondary | ICD-10-CM

## 2024-02-29 DIAGNOSIS — C562 Malignant neoplasm of left ovary: Secondary | ICD-10-CM | POA: Insufficient documentation

## 2024-02-29 DIAGNOSIS — Z5112 Encounter for antineoplastic immunotherapy: Secondary | ICD-10-CM | POA: Diagnosis not present

## 2024-02-29 LAB — CBC WITH DIFFERENTIAL/PLATELET
Abs Immature Granulocytes: 0.15 K/uL — ABNORMAL HIGH (ref 0.00–0.07)
Basophils Absolute: 0 K/uL (ref 0.0–0.1)
Basophils Relative: 0 %
Eosinophils Absolute: 0 K/uL (ref 0.0–0.5)
Eosinophils Relative: 0 %
HCT: 26.7 % — ABNORMAL LOW (ref 36.0–46.0)
Hemoglobin: 9 g/dL — ABNORMAL LOW (ref 12.0–15.0)
Immature Granulocytes: 2 %
Lymphocytes Relative: 7 %
Lymphs Abs: 0.6 K/uL — ABNORMAL LOW (ref 0.7–4.0)
MCH: 33.3 pg (ref 26.0–34.0)
MCHC: 33.7 g/dL (ref 30.0–36.0)
MCV: 98.9 fL (ref 80.0–100.0)
Monocytes Absolute: 0.3 K/uL (ref 0.1–1.0)
Monocytes Relative: 3 %
Neutro Abs: 7 K/uL (ref 1.7–7.7)
Neutrophils Relative %: 88 %
Platelets: 74 K/uL — ABNORMAL LOW (ref 150–400)
RBC: 2.7 MIL/uL — ABNORMAL LOW (ref 3.87–5.11)
RDW: 19.6 % — ABNORMAL HIGH (ref 11.5–15.5)
WBC: 8.1 K/uL (ref 4.0–10.5)
nRBC: 0 % (ref 0.0–0.2)

## 2024-02-29 LAB — COMPREHENSIVE METABOLIC PANEL WITH GFR
ALT: 20 U/L (ref 0–44)
AST: 21 U/L (ref 15–41)
Albumin: 4.3 g/dL (ref 3.5–5.0)
Alkaline Phosphatase: 143 U/L — ABNORMAL HIGH (ref 38–126)
Anion gap: 18 — ABNORMAL HIGH (ref 5–15)
BUN: 39 mg/dL — ABNORMAL HIGH (ref 8–23)
CO2: 16 mmol/L — ABNORMAL LOW (ref 22–32)
Calcium: 9.5 mg/dL (ref 8.9–10.3)
Chloride: 97 mmol/L — ABNORMAL LOW (ref 98–111)
Creatinine, Ser: 1.24 mg/dL — ABNORMAL HIGH (ref 0.44–1.00)
GFR, Estimated: 45 mL/min — ABNORMAL LOW
Glucose, Bld: 224 mg/dL — ABNORMAL HIGH (ref 70–99)
Potassium: 4.2 mmol/L (ref 3.5–5.1)
Sodium: 130 mmol/L — ABNORMAL LOW (ref 135–145)
Total Bilirubin: 0.4 mg/dL (ref 0.0–1.2)
Total Protein: 7.3 g/dL (ref 6.5–8.1)

## 2024-02-29 LAB — MAGNESIUM: Magnesium: 1.7 mg/dL (ref 1.7–2.4)

## 2024-02-29 MED ORDER — HEPARIN SOD (PORK) LOCK FLUSH 100 UNIT/ML IV SOLN
INTRAVENOUS | Status: AC
  Filled 2024-02-29: qty 5

## 2024-02-29 MED ORDER — IOHEXOL 300 MG/ML  SOLN
80.0000 mL | Freq: Once | INTRAMUSCULAR | Status: AC | PRN
  Administered 2024-02-29: 80 mL via INTRAVENOUS

## 2024-02-29 MED ORDER — IOHEXOL 9 MG/ML PO SOLN
ORAL | Status: AC
  Filled 2024-02-29: qty 1000

## 2024-02-29 MED ORDER — HEPARIN SOD (PORK) LOCK FLUSH 100 UNIT/ML IV SOLN
500.0000 [IU] | Freq: Once | INTRAVENOUS | Status: AC
  Administered 2024-02-29: 500 [IU] via INTRAVENOUS

## 2024-02-29 MED ORDER — IOHEXOL 9 MG/ML PO SOLN
500.0000 mL | ORAL | Status: AC
  Administered 2024-02-29: 500 mL via ORAL

## 2024-02-29 NOTE — Progress Notes (Signed)
 Patients port flushed without difficulty.  Good blood return noted with no bruising or swelling noted at site.  Labs drawn per orders. Port left accessed due to patient having CT scan today.  VSS with discharge and left in satisfactory condition with no s/s of distress noted. All follow ups as scheduled.       Rebecca [name]

## 2024-03-07 ENCOUNTER — Inpatient Hospital Stay: Attending: Hematology | Admitting: Oncology

## 2024-03-07 ENCOUNTER — Encounter: Payer: Self-pay | Admitting: Oncology

## 2024-03-07 ENCOUNTER — Inpatient Hospital Stay

## 2024-03-07 DIAGNOSIS — C562 Malignant neoplasm of left ovary: Secondary | ICD-10-CM

## 2024-03-09 ENCOUNTER — Inpatient Hospital Stay

## 2024-03-13 NOTE — Progress Notes (Signed)
 " Patient Care Team: [name], [name], NP as PCP - General (Nurse Practitioner) [name], RN as Oncology Nurse Navigator (Oncology) Kristine [name], NP as Nurse Practitioner (Cardiothoracic Surgery)  Clinic Day:  03/14/2024  Referring physician: Kristine [name], NP   CHIEF COMPLAINT:  CC: Stage IIIb high-grade serous ovarian carcinoma   ASSESSMENT & PLAN:   Assessment & Plan: Rebecca [name]  is a 79 y.o. female with high-grade serous ovarian carcinoma   Assessment and Plan Assessment & Plan Recurrent high-grade serous ovarian carcinoma.   Extensive oncology history below Patient completed 6 cycles of carboplatin  and Doxil  and is currently on maintenance bevacizumab  Last CT scan from 12/2023 showed progression of omental nodule Restarted on Chemotherapy along with bevacizumab    - Patient reports fatigue from chemotherapy, otherwise stable. - We reviewed labs today: CMP: Creatinine: 1.10-stable, normal LFTs, normal potassium and magnesium .  CBC: WBC:5.9, Hb:8.3, Plts: 127.  - Physical exam stable today.  Will proceed with chemotherapy today - Continue carboplatin  and Doxil , bevacizumab  every 3 weeks - Will repeat CT scan after completion of 6 cycles of treatment.  If patient has stable or good response, will discontinue chemotherapy and continue on maintenance bevacizumab . - Patient does not wish to do MRI for claustrophobia.  Does not want to try even with anxiolytic. -Reemphasized side effects of chemotherapy again.Carboplatin  commonly causes myelosuppression (anemia, neutropenia, thrombocytopenia), nausea, and hypersensitivity reactions, while pegylated liposomal doxorubicin  can lead to hand-foot syndrome, mucositis, myelosuppression, and cardiotoxicity. - Referred to Dr. Eloy for surgical re-evaluation regarding possible pelvic nodule resection and ensured transmission of all relevant reports. - Discussed option for second opinion with  another oncologist but patient refused at this time.   Return to clinic in 4 weeks for follow up.   Chemotherapy-induced anemia Anemia secondary to chemotherapy with hemoglobin at 8.3 g/dL, acceptable for ongoing chemotherapy. Managed with transfusions as needed. Education provided on transfusion thresholds and chemotherapy administration.  - Monitored hemoglobin and other blood counts. - Administered blood transfusions when hemoglobin was below threshold or she was symptomatic. - Assessed readiness for chemotherapy based on current blood work. - Provided education regarding transfusion thresholds and chemotherapy administration.  Chemotherapy-induced neutropenia Neutropenia secondary to chemotherapy with non-critical white blood cell count drop. Previous injections beneficial.  -Continue G-CSF post chemotherapy  Chemotherapy-induced peripheral neuropathy Numbness and tingling in hands and feet due to chemotherapy-induced peripheral neuropathy. Previously managed with gabapentin .  - Continue gabapentin  300 mg at night.  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 Ultrasound leg negative for DVT  - Improved at this time.  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  She knows to contact our office if she develops concerns prior to her next appointment.  The total time spent in the appointment was 30 minutes for the encounter with patient, including review of chart and various tests results, discussions about plan of care and coordination of care plan   Mickiel Dry, MD  [facility] [place] - A DEPT OF [facility] [address] [place] [address] Dept: [phone] Dept Fax:  [phone]   Orders Placed This Encounter  Procedures   CT ANGIO PELVIS W OR WO CONTRAST    Standing Status:   Future    Expected Date:  03/15/2024    Expiration Date:   03/14/2025    If indicated for the ordered procedure, I authorize the administration of contrast media per Radiology protocol:   Yes    Does the patient have a contrast media/X-ray dye allergy?:   No    Preferred imaging location?:   [facility]    Release to patient:   Immediate   CT Angio Abd/Pel w/ and/or w/o    Standing Status:   Future    Number of Occurrences:   1    Expected Date:   03/21/2024    Expiration Date:   03/14/2025    If indicated for the ordered procedure, I authorize the administration of contrast media per Radiology protocol:   Yes    Initiate TAVR/CTA/Valve Evaluation Adult Protocol:   Yes    Does the patient have a contrast media/X-ray dye allergy?:   Yes    Preferred imaging location?:   [facility]     ONCOLOGY HISTORY:   I have reviewed her chart and materials related to her cancer extensively and collaborated history with the patient. Summary of oncologic history is as follows:   Diagnosis: Stage IIIb high-grade serous ovarian carcinoma   -Presentation: Abdominal distention and bloating sensation -12/05/2020: CT abdomen: Very large [at least 23 x17x 18 cm] heterogeneous mass in the lower abdomen and pelvis.  Lesion demonstrates cystic and solid components.  Solid components measure up to at least 18 x 14 cm.  The lesion compresses the urinary bladder and is most suspicious for ovarian malignancy until proven otherwise. -12/05/2020: CA 125: 10,000 -12/18/2020: CT chest: Scattered solid pulmonary nodules measuring up to 3 mm, indeterminate.  -12/19/2020: Exploratory laparotomy, bilateral salpingo-oophorectomy, infra gastric omentectomy, R0 primary tumor debulking.  -Pathology: Left ovary and fallopian tube positive for high grade serous adenocarcinoma (26.5 cm), involving  tubal fimbriae, ovarian surface, and ovarian parenchyma. Omentum positive for high grade serous adenocarcinoma (1.0 cm, measured microscopically), involving fibroadipose tissue. Right ovary and fallopian tube negative for tumor. Tumor cells are p53 positive, p16 positive, and WT-1 positive. Staging: pT3b. FIGO Stage: IIIB.  -Cytology of peritoneal washing negative for malignancy.  -Germline mutation testing: negative (as per documentation) -02/11/2021-05/28/2021: 6 cycles of carboplatin  and paclitaxel   -05/28/2021-06/24/2022: Patient refused niraparib  maintenance and remained under surveillance with imaging showing stable to NED -06/24/2022: CT AP: 1.5 x 1.4 cm soft tissue nodule along the left pelvic sidewall adjacent to the left vaginal cuff is new in the interval. 6 mm soft tissue nodule in the anterior left subdiaphragmatic fat just above the splenic flexure is stable in the interval. -11/10/2022: CT AP: Interval increase in size of soft tissue nodule along the left pelvic sidewall abutting the left posterior wall of the bladder. Findings are concerning for progressive metastatic disease.Stable small peritoneal nodule within the left upper quadrant of the abdomen. -11/24/2022: Patient evaluated by Dr. Eloy [gynecologic oncology at [facility]] and felt not to be a candidate for debulking surgery -12/04/2022: Her2/neu IHC analysis: negative (1+) involving 12% of cells. -01/12/2023-06/16/2023: 6 cycles of carboplatin  and doxil   -05/12/2023: CT AP: Diminished size of a soft tissue nodule at the left aspect of the vaginal cuff, consistent with treatment response. Unchanged small nodule just anteriorly. Unchanged small peritoneal nodule in the left upper quadrant adjacent to the splenic flexure. No new evidence of lymphadenopathy or metastatic disease in the abdomen or pelvis. -06/15/2023: FOLR1 IHC analysis: Negative involving <1% of cells.  -07/23/2023-current: Maintenance bevacizumab   -09/02/2023: CT AP with  contrast: Stable exam since 05/12/2023. No acute intra-abdominal or pelvic pathology. Aortic Atherosclerosis. -11/16/2023: CT Head without contrast: Mild chronic small vessel disease throughout the deep white matter. No acute intracranial abnormality. -11/16/2023: MR Brain w/wo contrast: No acute intracranial abnormality. Mild chronic microvascular ischemic changes and mild parenchymal volume loss. 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 - 12/16/2023: CT CAP: Slight increase in size of the 2.3 x 1.3 cm asymmetric soft tissue along the left vaginal cuff, suspicious for local recurrent disease.No pathologically enlarged abdominal or pelvic lymph nodes. No evidence of metastatic disease in the chest. -01/01/2024- Current: Carboplatin  + Doxil  + bevacizumab  (restarted chemotherapy for progressive disease) -02/29/2024: CT CAP: Similar residual nodular soft tissue along the left vaginal cuff, similar to 12/16/2023 but enlarged from 09/02/2023, indicative of residual disease. 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   Current Treatment: Carboplatin + Doxil + bevacizumab   INTERVAL HISTORY:   Discussed the use of AI scribe software for clinical note transcription with the patient, who gave verbal consent to proceed.  History of Present Illness Rebecca [name] is a 79 year old female with metastatic ovarian cancer and chemotherapy-induced anemia who presents for follow up. She is accompanied by her son today.   Over the past month, she has experienced significant weakness and decreased functional status following blood transfusions for anemia (hemoglobin previously 7.6 and 7.8 g/dL). She reports persistent fatigue and inability to perform activities of daily living, including driving and household tasks. This decline is atypical compared to prior chemotherapy cycles, during which she typically regained strength within  two weeks. She attributes her decline to the timing of chemotherapy relative to transfusions and expresses concern regarding anemia management and chemotherapy scheduling.  She expresses confusion regarding the relationship between blood counts, transfusions, and chemotherapy administration. She seeks clarification on her treatment plan but is not open to a second opinion. She also wishes to re-establish follow-up with her surgical oncologist, having missed prior appointments due to transportation issues. She remains optimistic and expresses strong faith in her recovery.  She continues to have a large, symptomatic ventral hernia related to prior abdominal surgery, which remains untreated. She denies new abdominal pain or other acute symptoms aside from chronic hernia discomfort.   I have reviewed the past medical history, past surgical history, social history and family history with the patient and they are unchanged from previous note.  ALLERGIES:  is allergic to aspirin, mometasone, naproxen sodium, penicillin g, triamcinolone, codeine, ibuprofen, and iodinated contrast media.  MEDICATIONS:  Current Outpatient Medications  Medication Sig Dispense Refill   acetaminophen  (TYLENOL ) 325 MG tablet Take 3 tablets (975 mg total) by mouth every 6 (six) hours as needed for moderate pain. (Patient taking differently: Take 650 mg by mouth every 6 (six) hours as needed for moderate pain (pain score 4-6).) 360 tablet 2   atorvastatin  (LIPITOR) 40 MG tablet Take 40 mg by mouth daily.     B-D ULTRAFINE III SHORT PEN 31G X 8 MM MISC SMARTSIG:1 Each SUB-Q Daily     baclofen (LIORESAL) 10 MG tablet Take 10 mg by mouth 3 (three) times daily as needed for muscle spasms.     clopidogrel  (PLAVIX ) 75 MG tablet Take 75 mg by mouth daily.     diphenhydrAMINE  (BENADRYL ) 50 MG tablet Take 1 tablet (50 mg total) by mouth once for 1 dose. Take 1 hour prior to CT scan 1 tablet 0   diphenhydrAMINE -zinc acetate (BENADRYL )  cream Apply 1 application topically 3 (three) times daily as needed for itching.     ferrous sulfate 325 (65 FE) MG tablet Take 325 mg by mouth daily.     furosemide  (LASIX ) 20 MG tablet Take 1 tablet (20 mg total) by mouth in the morning. (Patient taking differently: Take 20 mg by mouth daily as needed for fluid.) 30 tablet 2   gabapentin  (NEURONTIN ) 300 MG capsule Take 1 capsule (300 mg total) by mouth at bedtime. 90 capsule 1   lidocaine -prilocaine  (EMLA ) cream Apply to affected area once 30 g 3   losartan  (COZAAR ) 50 MG tablet Take 50 mg by mouth at bedtime.     magnesium  oxide (MAG-OX) 400 (240 Mg) MG tablet Take 1 tablet (400 mg total) by mouth 2 (two) times daily. 60 tablet 6   Misc. Devices MISC Please provide patient with 3 diabetic nutritional supplements per day. 1 each 11   nitrofurantoin , macrocrystal-monohydrate, (MACROBID ) 100 MG capsule Take 100 mg by mouth 2 (two) times daily.     pantoprazole  (PROTONIX ) 20 MG tablet Take 20 mg by mouth daily.     pioglitazone (ACTOS) 15 MG tablet Take 15 mg by mouth daily.     polyethylene glycol (MIRALAX ) 17 g packet Take 17 g by mouth daily. 30 packet 1   prochlorperazine  (COMPAZINE ) 10 MG tablet Take 1 tablet (10 mg total) by mouth every 6 (six) hours as needed (Nausea or vomiting). 30 tablet 1   rosuvastatin  (CRESTOR ) 20 MG tablet Take 1 tablet (20 mg total) by mouth daily. 30 tablet 2   SOLIQUA 100-33 UNT-MCG/ML SOPN Inject 35 Units into the skin daily.     Tetrahydrozoline HCl (REDNESS RELIEVER EYE DROPS OP) Place 1 drop into both eyes daily as needed (redness).     traMADol  (ULTRAM ) 50 MG tablet TAKE 1 TABLET (50 MG TOTAL) BY MOUTH 3 (THREE) TIMES DAILY AS NEEDED FOR UP TO 7 DAYS.     ciprofloxacin  (CIPRO ) 500 MG tablet Take 1 tablet (500 mg total) by mouth daily with breakfast. (Patient not taking: Reported on 03/16/2024) 5 tablet 0   docusate sodium  (COLACE) 100 MG capsule Take 1 capsule (100 mg total) by mouth daily as needed for mild  constipation. 30 capsule 1   predniSONE  (DELTASONE ) 50 MG tablet Take 13hrs, 7hrs and 1hr before port placementTake 13hrs, 7hrs and 1hr before procedure 3 tablet 0   No current facility-administered medications for this visit.   VITALS:  Resp. rate 20.  Wt Readings from Last 3 Encounters:  03/14/24 196 lb 13.9 oz (89.3 kg)  02/29/24 198 lb 10.2 oz (90.1 kg)  02/15/24 199 lb 1.6 oz (90.3 kg)    There is no height or weight on file to calculate BMI.  Performance status (ECOG): 1 - Symptomatic but completely ambulatory  PHYSICAL EXAM:   GENERAL:alert, no distress and comfortable SKIN: skin color, texture, turgor are normal, no rashes or significant lesions LYMPH:  no palpable lymphadenopathy in the cervical, axillary or inguinal LUNGS: clear to auscultation and percussion with normal breathing effort HEART: regular rate & rhythm and no murmurs and no lower extremity edema ABDOMEN:Hernia +, non tender Musculoskeletal:no cyanosis of digits and no clubbing  NEURO: alert & oriented x 3 with fluent speech  LABORATORY DATA:  I have reviewed the data as listed   Lab Results  Component Value Date   WBC 5.9 03/14/2024   NEUTROABS 3.7 03/14/2024   HGB 8.3 (L) 03/14/2024   HCT 25.2 (L) 03/14/2024  MCV 103.3 (H) 03/14/2024   PLT 127 (L) 03/14/2024      Chemistry      Component Value Date/Time   NA 138 03/14/2024 0909   K 4.1 03/14/2024 0909   CL 103 03/14/2024 0909   CO2 20 (L) 03/14/2024 0909   BUN 17 03/14/2024 0909   CREATININE 1.10 (H) 03/14/2024 0909      Component Value Date/Time   CALCIUM  9.5 03/14/2024 0909   ALKPHOS 127 (H) 03/14/2024 0909   AST 21 03/14/2024 0909   ALT 19 03/14/2024 0909   BILITOT 0.5 03/14/2024 0909       Latest Reference Range & Units 03/14/24 09:09  Cancer Antigen (CA) 125 0.0 - 38.1 U/mL 16.2    RADIOGRAPHIC STUDIES: I have personally reviewed the radiological report below  CT CHEST ABDOMEN PELVIS W CONTRAST CLINICAL DATA:  Ovarian  cancer.  * Tracking Code: BO *  EXAM: CT CHEST, ABDOMEN, AND PELVIS WITH CONTRAST  TECHNIQUE: Multidetector CT imaging of the chest, abdomen and pelvis was performed following the standard protocol during bolus administration of intravenous contrast.  RADIATION DOSE REDUCTION: This exam was performed according to the departmental dose-optimization program which includes automated exposure control, adjustment of the mA and/or kV according to patient size and/or use of iterative reconstruction technique.  CONTRAST:  80mL OMNIPAQUE  IOHEXOL  300 MG/ML  SOLN  COMPARISON:  12/16/2023.  FINDINGS: CT CHEST FINDINGS  Cardiovascular: Right IJ Port-A-Cath terminates in the right atrium. Atherosclerotic calcification of the aorta, aortic valve and coronary arteries. Heart is enlarged. No pericardial effusion.  Mediastinum/Nodes: No pathologically enlarged mediastinal, hilar or axillary lymph nodes. Esophagus is grossly unremarkable.  Lungs/Pleura: Slight increased density in the lungs due to expiratory phase imaging. Tiny pulmonary nodules measure 2 mm or less in size, unchanged. No new or suspicious pulmonary nodules. No pleural fluid. Airway is otherwise unremarkable.  Musculoskeletal: Degenerative changes in the spine. No worrisome lytic or sclerotic lesions.  CT ABDOMEN PELVIS FINDINGS  Hepatobiliary: Probable tiny cyst in the right hepatic lobe, unchanged. Liver is otherwise unremarkable. Cholecystectomy. No biliary ductal dilatation.  Pancreas: Negative.  Spleen: Negative.  Adrenals/Urinary Tract: Adrenal glands are unremarkable. Subcentimeter low-attenuation lesions in the kidneys, too small to characterize. No specific follow-up necessary. Scarring in the kidneys bilaterally with mild atrophy on the left. Ureters are decompressed. Bladder is grossly unremarkable.  Stomach/Bowel: Small bowel extends in to a wide ventral midline incisional hernia. Stomach, small bowel,  appendix and colon are otherwise unremarkable. Slight rectal prolapse or rectocele.  Vascular/Lymphatic: Atherosclerotic calcification of the aorta. A filling defect is seen in the right gonadal vein (4/74-80). No pathologically enlarged lymph nodes.  Reproductive: Hysterectomy. Residual nodular soft tissue along the left vaginal cuff measures 1.8 x 3.9 cm (4/103), grossly stable from 12/16/2023 when remeasured in a similar fashion but enlarged from 1.5 x 2.6 cm on 09/02/2023. Otherwise, no adnexal mass.  Other: Bilateral inguinal hernias contain fat. No free fluid. Focal fat necrosis in the lower left lateral omentum (4/94) nonspecific. No discrete peritoneal nodularity or thickening. Wide midline ventral incisional hernia or laxity contains unobstructed small bowel.  Musculoskeletal: Degenerative changes in the spine. No worrisome lytic or sclerotic lesions.  IMPRESSION: 1. Similar residual nodular soft tissue along the left vaginal cuff, similar to 12/16/2023 but enlarged from 09/02/2023, indicative of residual disease. No evidence of metastatic disease. 2. 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 Difficult to definitively exclude mixing artifact. These results will be called to the ordering clinician or representative  by the Radiologist Assistant, and communication documented in the PACS or [employer]. 3. Wide ventral midline incisional hernia contains unobstructed small bowel. 4. Aortic atherosclerosis (ICD10-I70.0). Coronary artery calcification.  Electronically Signed   By: Newell Eke M.D.   On: 03/12/2024 10:01    "

## 2024-03-14 ENCOUNTER — Inpatient Hospital Stay: Attending: Hematology

## 2024-03-14 ENCOUNTER — Encounter: Payer: Self-pay | Admitting: Oncology

## 2024-03-14 ENCOUNTER — Inpatient Hospital Stay

## 2024-03-14 ENCOUNTER — Inpatient Hospital Stay: Admitting: Oncology

## 2024-03-14 VITALS — Resp 20

## 2024-03-14 VITALS — BP 167/88 | HR 104 | Temp 97.7°F | Resp 20

## 2024-03-14 DIAGNOSIS — R300 Dysuria: Secondary | ICD-10-CM

## 2024-03-14 DIAGNOSIS — T451X5A Adverse effect of antineoplastic and immunosuppressive drugs, initial encounter: Secondary | ICD-10-CM | POA: Diagnosis not present

## 2024-03-14 DIAGNOSIS — Z79899 Other long term (current) drug therapy: Secondary | ICD-10-CM | POA: Diagnosis not present

## 2024-03-14 DIAGNOSIS — C562 Malignant neoplasm of left ovary: Secondary | ICD-10-CM

## 2024-03-14 DIAGNOSIS — I8290 Acute embolism and thrombosis of unspecified vein: Secondary | ICD-10-CM | POA: Insufficient documentation

## 2024-03-14 DIAGNOSIS — D696 Thrombocytopenia, unspecified: Secondary | ICD-10-CM | POA: Diagnosis not present

## 2024-03-14 DIAGNOSIS — K5903 Drug induced constipation: Secondary | ICD-10-CM | POA: Diagnosis not present

## 2024-03-14 DIAGNOSIS — G620 Drug-induced polyneuropathy: Secondary | ICD-10-CM

## 2024-03-14 DIAGNOSIS — D649 Anemia, unspecified: Secondary | ICD-10-CM

## 2024-03-14 DIAGNOSIS — Z5112 Encounter for antineoplastic immunotherapy: Secondary | ICD-10-CM | POA: Diagnosis present

## 2024-03-14 DIAGNOSIS — D701 Agranulocytosis secondary to cancer chemotherapy: Secondary | ICD-10-CM

## 2024-03-14 LAB — COMPREHENSIVE METABOLIC PANEL WITH GFR
ALT: 19 U/L (ref 0–44)
AST: 21 U/L (ref 15–41)
Albumin: 4.1 g/dL (ref 3.5–5.0)
Alkaline Phosphatase: 127 U/L — ABNORMAL HIGH (ref 38–126)
Anion gap: 15 (ref 5–15)
BUN: 17 mg/dL (ref 8–23)
CO2: 20 mmol/L — ABNORMAL LOW (ref 22–32)
Calcium: 9.5 mg/dL (ref 8.9–10.3)
Chloride: 103 mmol/L (ref 98–111)
Creatinine, Ser: 1.1 mg/dL — ABNORMAL HIGH (ref 0.44–1.00)
GFR, Estimated: 52 mL/min — ABNORMAL LOW
Glucose, Bld: 98 mg/dL (ref 70–99)
Potassium: 4.1 mmol/L (ref 3.5–5.1)
Sodium: 138 mmol/L (ref 135–145)
Total Bilirubin: 0.5 mg/dL (ref 0.0–1.2)
Total Protein: 7.1 g/dL (ref 6.5–8.1)

## 2024-03-14 LAB — CBC WITH DIFFERENTIAL/PLATELET
Abs Immature Granulocytes: 0.03 K/uL (ref 0.00–0.07)
Basophils Absolute: 0 K/uL (ref 0.0–0.1)
Basophils Relative: 0 %
Eosinophils Absolute: 0 K/uL (ref 0.0–0.5)
Eosinophils Relative: 0 %
HCT: 25.2 % — ABNORMAL LOW (ref 36.0–46.0)
Hemoglobin: 8.3 g/dL — ABNORMAL LOW (ref 12.0–15.0)
Immature Granulocytes: 1 %
Lymphocytes Relative: 24 %
Lymphs Abs: 1.4 K/uL (ref 0.7–4.0)
MCH: 34 pg (ref 26.0–34.0)
MCHC: 32.9 g/dL (ref 30.0–36.0)
MCV: 103.3 fL — ABNORMAL HIGH (ref 80.0–100.0)
Monocytes Absolute: 0.7 K/uL (ref 0.1–1.0)
Monocytes Relative: 12 %
Neutro Abs: 3.7 K/uL (ref 1.7–7.7)
Neutrophils Relative %: 63 %
Platelets: 127 K/uL — ABNORMAL LOW (ref 150–400)
RBC: 2.44 MIL/uL — ABNORMAL LOW (ref 3.87–5.11)
RDW: 21.1 % — ABNORMAL HIGH (ref 11.5–15.5)
Smear Review: NORMAL
WBC: 5.9 K/uL (ref 4.0–10.5)
nRBC: 0 % (ref 0.0–0.2)

## 2024-03-14 LAB — URINALYSIS, DIPSTICK ONLY
Bilirubin Urine: NEGATIVE
Glucose, UA: NEGATIVE mg/dL
Ketones, ur: NEGATIVE mg/dL
Nitrite: NEGATIVE
Protein, ur: NEGATIVE mg/dL
Specific Gravity, Urine: 1.003 — ABNORMAL LOW (ref 1.005–1.030)
pH: 7 (ref 5.0–8.0)

## 2024-03-14 LAB — MAGNESIUM: Magnesium: 1.8 mg/dL (ref 1.7–2.4)

## 2024-03-14 LAB — FERRITIN: Ferritin: 1230 ng/mL — ABNORMAL HIGH (ref 11–307)

## 2024-03-14 MED ORDER — DOXORUBICIN HCL LIPOSOMAL CHEMO INJECTION 2 MG/ML
22.5000 mg/m2 | Freq: Once | INTRAVENOUS | Status: AC
  Administered 2024-03-14: 50 mg via INTRAVENOUS
  Filled 2024-03-14: qty 25

## 2024-03-14 MED ORDER — FAMOTIDINE IN NACL 20-0.9 MG/50ML-% IV SOLN
20.0000 mg | Freq: Once | INTRAVENOUS | Status: AC
  Administered 2024-03-14: 20 mg via INTRAVENOUS
  Filled 2024-03-14: qty 50

## 2024-03-14 MED ORDER — DEXTROSE 5 % IV SOLN: Freq: Once | INTRAVENOUS | Status: AC

## 2024-03-14 MED ORDER — SODIUM CHLORIDE 0.9 % IV SOLN: INTRAVENOUS | Status: DC

## 2024-03-14 MED ORDER — PREDNISONE 50 MG PO TABS: ORAL_TABLET | ORAL | 0 refills | Status: AC

## 2024-03-14 MED ORDER — POLYETHYLENE GLYCOL 3350 17 G PO PACK: 17.0000 g | PACK | Freq: Every day | ORAL | 1 refills | Status: AC

## 2024-03-14 MED ORDER — SODIUM CHLORIDE 0.9 % IV SOLN
400.0000 mg | Freq: Once | INTRAVENOUS | Status: AC
  Administered 2024-03-14: 400 mg via INTRAVENOUS
  Filled 2024-03-14: qty 40

## 2024-03-14 MED ORDER — CETIRIZINE HCL 10 MG/ML IV SOLN
10.0000 mg | Freq: Once | INTRAVENOUS | Status: AC
  Administered 2024-03-14: 10 mg via INTRAVENOUS
  Filled 2024-03-14: qty 1

## 2024-03-14 MED ORDER — SODIUM CHLORIDE 0.9 % IV SOLN
150.0000 mg | Freq: Once | INTRAVENOUS | Status: AC
  Administered 2024-03-14: 150 mg via INTRAVENOUS
  Filled 2024-03-14: qty 150

## 2024-03-14 MED ORDER — PALONOSETRON HCL INJECTION 0.25 MG/5ML
0.2500 mg | Freq: Once | INTRAVENOUS | Status: AC
  Administered 2024-03-14: 0.25 mg via INTRAVENOUS
  Filled 2024-03-14: qty 5

## 2024-03-14 MED ORDER — DEXAMETHASONE SODIUM PHOSPHATE 10 MG/ML IJ SOLN
10.0000 mg | Freq: Once | INTRAMUSCULAR | Status: AC
  Administered 2024-03-14: 10 mg via INTRAVENOUS

## 2024-03-14 MED ORDER — DOCUSATE SODIUM 100 MG PO CAPS: 100.0000 mg | ORAL_CAPSULE | Freq: Every day | ORAL | 1 refills | Status: AC | PRN

## 2024-03-14 MED ORDER — SODIUM CHLORIDE 0.9 % IV SOLN
10.0000 mg/kg | Freq: Once | INTRAVENOUS | Status: AC
  Administered 2024-03-14: 900 mg via INTRAVENOUS
  Filled 2024-03-14: qty 32

## 2024-03-14 NOTE — Patient Instructions (Addendum)
 [facility] Discharge Instructions   You were seen and examined today by Dr. Davonna.  She reviewed the results of your lab work which are normal/stable.   She reviewed the results of your recent CT scan. It shows that the cancer has not grown or spread.  We will proceed with your treatment today.   Return as scheduled.    Thank you for choosing  [facility] Center at [facility] to provide your oncology and hematology care.  To afford each patient quality time with our provider, please arrive at least 15 minutes before your scheduled appointment time.   If you have a lab appointment with the Cancer Center please come in thru the Main Entrance and check in at the main information desk.  You need to re-schedule your appointment should you arrive 10 or more minutes late.  We strive to give you quality time with our providers, and arriving late affects you and other patients whose appointments are after yours.  Also, if you no show three or more times for appointments you may be dismissed from the clinic at the providers discretion.     Again, thank you for choosing [facility].  Our hope is that these requests will decrease the amount of time that you wait before being seen by our physicians.       _____________________________________________________________  Should you have questions after your visit to [facility], please contact our office at [phone] and follow the prompts.  Our office hours are 8:00 a.m. and 4:30 p.m. Monday - Friday.  Please note that voicemails left after 4:00 p.m. may not be returned until the following business day.  We are closed weekends and major holidays.  You do have access to a nurse 24-7, just call the main number to the clinic [phone] and do not press any options, hold on the line and a nurse will answer the phone.    For prescription refill requests, have your pharmacy  contact our office and allow 72 hours.    Due to Covid, you will need to wear a mask upon entering the hospital. If you do not have a mask, a mask will be given to you at the Main Entrance upon arrival. For doctor visits, patients may have 1 support person age 40 or older with them. For treatment visits, patients can not have anyone with them due to social distancing guidelines and our immunocompromised population.

## 2024-03-14 NOTE — Progress Notes (Signed)
 Patient has been examined by Dr. Davonna. Vital signs and labs have been reviewed by MD - ANC, Creatinine, LFTs, hemoglobin, and platelets have been reviewed by M.D. - pt may proceed with treatment.  Primary RN and pharmacy notified.

## 2024-03-14 NOTE — Progress Notes (Unsigned)
Labs reviewed, ok to treat per parameters. 

## 2024-03-15 ENCOUNTER — Other Ambulatory Visit: Payer: Self-pay

## 2024-03-15 LAB — CA 125: Cancer Antigen (CA) 125: 16.2 U/mL (ref 0.0–38.1)

## 2024-03-16 ENCOUNTER — Ambulatory Visit (HOSPITAL_COMMUNITY)
Admission: RE | Admit: 2024-03-16 | Discharge: 2024-03-16 | Disposition: A | Discharge status: Home / Self Care | Source: Ambulatory Visit | Attending: Oncology | Admitting: Oncology

## 2024-03-16 ENCOUNTER — Inpatient Hospital Stay

## 2024-03-16 VITALS — BP 154/84 | HR 99 | Temp 97.5°F | Resp 20

## 2024-03-16 DIAGNOSIS — I8290 Acute embolism and thrombosis of unspecified vein: Secondary | ICD-10-CM | POA: Insufficient documentation

## 2024-03-16 DIAGNOSIS — C562 Malignant neoplasm of left ovary: Secondary | ICD-10-CM

## 2024-03-16 DIAGNOSIS — Z5112 Encounter for antineoplastic immunotherapy: Secondary | ICD-10-CM | POA: Diagnosis not present

## 2024-03-16 MED ORDER — IOHEXOL 350 MG/ML SOLN
100.0000 mL | Freq: Once | INTRAVENOUS | Status: AC | PRN
  Administered 2024-03-16: 100 mL via INTRAVENOUS

## 2024-03-16 MED ORDER — PEGFILGRASTIM-FPGK 6 MG/0.6ML ~~LOC~~ SOSY
6.0000 mg | PREFILLED_SYRINGE | Freq: Once | SUBCUTANEOUS | Status: AC
  Administered 2024-03-16: 6 mg via SUBCUTANEOUS
  Filled 2024-03-16: qty 0.6

## 2024-03-16 NOTE — Progress Notes (Signed)
 Patient tolerated injection with no complaints voiced. Site clean and dry with no bruising or swelling noted at site. See MAR for details. Band aid applied.  Patient stable during and after injection. VSS with discharge and left in satisfactory condition with no s/s of distress noted.

## 2024-03-16 NOTE — Patient Instructions (Signed)
 [facility] [place] - A DEPT OF [facility]  Discharge Instructions: Thank you for choosing [facility] to provide your oncology and hematology care.  If you have a lab appointment with the Cancer Center - please note that after April 8th, 2024, all labs will be drawn in the cancer center.  You do not have to check in or register with the main entrance as you have in the past but will complete your check-in in the cancer center.  Wear comfortable clothing and clothing appropriate for easy access to any Portacath or PICC line.   We strive to give you quality time with your provider. You may need to reschedule your appointment if you arrive late (15 or more minutes).  Arriving late affects you and other patients whose appointments are after yours.  Also, if you miss three or more appointments without notifying the office, you may be dismissed from the clinic at the provider's discretion.      For prescription refill requests, have your pharmacy contact our office and allow 72 hours for refills to be completed.    Today you received the following Stimufend, return as scheduled.   To help prevent nausea and vomiting after your treatment, we encourage you to take your nausea medication as directed.  BELOW ARE SYMPTOMS THAT SHOULD BE REPORTED IMMEDIATELY: *FEVER GREATER THAN 100.4 F (38 C) OR HIGHER *CHILLS OR SWEATING *NAUSEA AND VOMITING THAT IS NOT CONTROLLED WITH YOUR NAUSEA MEDICATION *UNUSUAL SHORTNESS OF BREATH *UNUSUAL BRUISING OR BLEEDING *URINARY PROBLEMS (pain or burning when urinating, or frequent urination) *BOWEL PROBLEMS (unusual diarrhea, constipation, pain near the anus) TENDERNESS IN MOUTH AND THROAT WITH OR WITHOUT PRESENCE OF ULCERS (sore throat, sores in mouth, or a toothache) UNUSUAL RASH, SWELLING OR PAIN  UNUSUAL VAGINAL DISCHARGE OR ITCHING   Items with * indicate a potential emergency and should be followed up as soon as possible  or go to the Emergency Department if any problems should occur.  Please show the CHEMOTHERAPY ALERT CARD or IMMUNOTHERAPY ALERT CARD at check-in to the Emergency Department and triage nurse.  Should you have questions after your visit or need to cancel or reschedule your appointment, please contact [facility] [place] - A DEPT OF Eligha Bridegroom [facility] [phone]  and follow the prompts.  Office hours are 8:00 a.m. to 4:30 p.m. Monday - Friday. Please note that voicemails left after 4:00 p.m. may not be returned until the following business day.  We are closed weekends and major holidays. You have access to a nurse at all times for urgent questions. Please call the main number to the clinic [phone] and follow the prompts.  For any non-urgent questions, you may also contact your provider using MyChart. We now offer e-Visits for anyone 4 and older to request care online for non-urgent symptoms. For details visit [email or web address].   Also download the MyChart app! Go to the app store, search "MyChart", open the app, select [place], and log in with your MyChart username and password.

## 2024-03-17 ENCOUNTER — Encounter: Payer: Self-pay | Admitting: Oncology

## 2024-03-20 ENCOUNTER — Encounter: Payer: Self-pay | Admitting: Oncology

## 2024-03-24 ENCOUNTER — Encounter: Payer: Self-pay | Admitting: *Deleted

## 2024-03-24 NOTE — Progress Notes (Signed)
 Pt's infusion appt moved d/t anticipation of inclement weather on Monday 1/26.  Raksha Wolfgang, PharmD, MBA

## 2024-03-24 NOTE — Progress Notes (Signed)
 I called patient today to discuss her upcoming appointments and she stated that she was going to call us  anyway because she wasn't feeling well.  Patient states that she just hasn't recovered as quickly with this last treatment as she had in the past and was wondering if she could get some fluids.  She states that she just feels tired and has no energy.  I spoke with Dr. Davonna and she wants to give patient normal saline 1 liter over 2 hours.  Her appointments were updated to come in tomorrow and time added for fluids. She is appreciative and had questions about her injection.  I explained tomorrow that her treatment is not the chemo and does not require injection.  She will get that after her treatment in February.  She verbalized understanding and has no further questions.

## 2024-03-25 ENCOUNTER — Inpatient Hospital Stay

## 2024-03-25 ENCOUNTER — Other Ambulatory Visit: Payer: Self-pay | Admitting: Oncology

## 2024-03-25 VITALS — BP 174/85 | HR 92 | Temp 97.1°F | Resp 18 | Wt 198.8 lb

## 2024-03-25 DIAGNOSIS — C562 Malignant neoplasm of left ovary: Secondary | ICD-10-CM

## 2024-03-25 DIAGNOSIS — D649 Anemia, unspecified: Secondary | ICD-10-CM

## 2024-03-25 DIAGNOSIS — Z5112 Encounter for antineoplastic immunotherapy: Secondary | ICD-10-CM | POA: Diagnosis not present

## 2024-03-25 DIAGNOSIS — Z95828 Presence of other vascular implants and grafts: Secondary | ICD-10-CM

## 2024-03-25 LAB — CBC WITH DIFFERENTIAL/PLATELET
Abs Immature Granulocytes: 0.08 K/uL — ABNORMAL HIGH (ref 0.00–0.07)
Basophils Absolute: 0 K/uL (ref 0.0–0.1)
Basophils Relative: 0 %
Eosinophils Absolute: 0 K/uL (ref 0.0–0.5)
Eosinophils Relative: 0 %
HCT: 20 % — ABNORMAL LOW (ref 36.0–46.0)
Hemoglobin: 6.7 g/dL — CL (ref 12.0–15.0)
Immature Granulocytes: 2 %
Lymphocytes Relative: 20 %
Lymphs Abs: 1 K/uL (ref 0.7–4.0)
MCH: 35.1 pg — ABNORMAL HIGH (ref 26.0–34.0)
MCHC: 33.5 g/dL (ref 30.0–36.0)
MCV: 104.7 fL — ABNORMAL HIGH (ref 80.0–100.0)
Monocytes Absolute: 0.3 K/uL (ref 0.1–1.0)
Monocytes Relative: 5 %
Neutro Abs: 3.7 K/uL (ref 1.7–7.7)
Neutrophils Relative %: 73 %
Platelets: 79 K/uL — ABNORMAL LOW (ref 150–400)
RBC: 1.91 MIL/uL — ABNORMAL LOW (ref 3.87–5.11)
RDW: 19.9 % — ABNORMAL HIGH (ref 11.5–15.5)
WBC: 5.1 K/uL (ref 4.0–10.5)
nRBC: 0 % (ref 0.0–0.2)

## 2024-03-25 LAB — COMPREHENSIVE METABOLIC PANEL WITH GFR
ALT: 27 U/L (ref 0–44)
AST: 24 U/L (ref 15–41)
Albumin: 3.8 g/dL (ref 3.5–5.0)
Alkaline Phosphatase: 149 U/L — ABNORMAL HIGH (ref 38–126)
Anion gap: 14 (ref 5–15)
BUN: 22 mg/dL (ref 8–23)
CO2: 20 mmol/L — ABNORMAL LOW (ref 22–32)
Calcium: 9.1 mg/dL (ref 8.9–10.3)
Chloride: 103 mmol/L (ref 98–111)
Creatinine, Ser: 1.01 mg/dL — ABNORMAL HIGH (ref 0.44–1.00)
GFR, Estimated: 57 mL/min — ABNORMAL LOW
Glucose, Bld: 88 mg/dL (ref 70–99)
Potassium: 4.5 mmol/L (ref 3.5–5.1)
Sodium: 137 mmol/L (ref 135–145)
Total Bilirubin: 0.5 mg/dL (ref 0.0–1.2)
Total Protein: 6.7 g/dL (ref 6.5–8.1)

## 2024-03-25 LAB — PREPARE RBC (CROSSMATCH)

## 2024-03-25 LAB — MAGNESIUM: Magnesium: 1.4 mg/dL — ABNORMAL LOW (ref 1.7–2.4)

## 2024-03-25 MED ORDER — SODIUM CHLORIDE 0.9 % IV SOLN: Freq: Once | INTRAVENOUS | Status: AC

## 2024-03-25 MED ORDER — ACETAMINOPHEN 325 MG PO TABS
650.0000 mg | ORAL_TABLET | Freq: Once | ORAL | Status: AC
  Administered 2024-03-25: 650 mg via ORAL
  Filled 2024-03-25: qty 2

## 2024-03-25 MED ORDER — SODIUM CHLORIDE 0.9% IV SOLUTION
250.0000 mL | INTRAVENOUS | Status: DC
  Administered 2024-03-25: 100 mL via INTRAVENOUS

## 2024-03-25 MED ORDER — MAGNESIUM SULFATE 4 GM/100ML IV SOLN
4.0000 g | Freq: Once | INTRAVENOUS | Status: AC
  Administered 2024-03-25: 4 g via INTRAVENOUS
  Filled 2024-03-25: qty 100

## 2024-03-25 MED ORDER — DIPHENHYDRAMINE HCL 25 MG PO CAPS: 25.0000 mg | ORAL_CAPSULE | Freq: Once | ORAL | Status: DC

## 2024-03-25 MED ORDER — DIPHENHYDRAMINE HCL 25 MG PO TABS
25.0000 mg | ORAL_TABLET | Freq: Once | ORAL | Status: AC
  Administered 2024-03-25: 25 mg via ORAL
  Filled 2024-03-25: qty 1

## 2024-03-25 NOTE — Patient Instructions (Signed)
 [facility] [place] - A DEPT OF [facility]  Discharge Instructions: Thank you for choosing [facility] to provide your oncology and hematology care.  If you have a lab appointment with the Cancer Center - please note that after April 8th, 2024, all labs will be drawn in the cancer center.  You do not have to check in or register with the main entrance as you have in the past but will complete your check-in in the cancer center.  Wear comfortable clothing and clothing appropriate for easy access to any Portacath or PICC line.   We strive to give you quality time with your provider. You may need to reschedule your appointment if you arrive late (15 or more minutes).  Arriving late affects you and other patients whose appointments are after yours.  Also, if you miss three or more appointments without notifying the office, you may be dismissed from the clinic at the providers discretion.      For prescription refill requests, have your pharmacy contact our office and allow 72 hours for refills to be completed.    Today you received the following chemotherapy and/or immunotherapy agents Vegzelma / 2UPRBC      To help prevent nausea and vomiting after your treatment, we encourage you to take your nausea medication as directed.  BELOW ARE SYMPTOMS THAT SHOULD BE REPORTED IMMEDIATELY: *FEVER GREATER THAN 100.4 F (38 C) OR HIGHER *CHILLS OR SWEATING *NAUSEA AND VOMITING THAT IS NOT CONTROLLED WITH YOUR NAUSEA MEDICATION *UNUSUAL SHORTNESS OF BREATH *UNUSUAL BRUISING OR BLEEDING *URINARY PROBLEMS (pain or burning when urinating, or frequent urination) *BOWEL PROBLEMS (unusual diarrhea, constipation, pain near the anus) TENDERNESS IN MOUTH AND THROAT WITH OR WITHOUT PRESENCE OF ULCERS (sore throat, sores in mouth, or a toothache) UNUSUAL RASH, SWELLING OR PAIN  UNUSUAL VAGINAL DISCHARGE OR ITCHING   Items with * indicate a potential emergency and should be  followed up as soon as possible or go to the Emergency Department if any problems should occur.  Please show the CHEMOTHERAPY ALERT CARD or IMMUNOTHERAPY ALERT CARD at check-in to the Emergency Department and triage nurse.  Should you have questions after your visit or need to cancel or reschedule your appointment, please contact [facility] [place] - A DEPT OF [facility] [phone]  and follow the prompts.  Office hours are 8:00 a.m. to 4:30 p.m. Monday - Friday. Please note that voicemails left after 4:00 p.m. may not be returned until the following business day.  We are closed weekends and major holidays. You have access to a nurse at all times for urgent questions. Please call the main number to the clinic [phone] and follow the prompts.  For any non-urgent questions, you may also contact your provider using MyChart. We now offer e-Visits for anyone 31 and older to request care online for non-urgent symptoms. For details visit [email or web address].   Also download the MyChart app! Go to the app store, search MyChart, open the app, select [place], and log in with your MyChart username and password.

## 2024-03-25 NOTE — Progress Notes (Signed)
 CRITICAL VALUE ALERT Critical value received:  hgb 6.7 Date of notification:  03-25-24 Time of notification: 0954 Critical value read back:  Yes.   Nurse who received alert:  C. pageRN MD notified time and response:  [phone] Dr. Davonna

## 2024-03-25 NOTE — Progress Notes (Signed)
Treatment given today per MD orders.  Tolerated infusion without adverse affects.  Vital signs stable.  No complaints at this time.  Discharge from clinic via wheelchair in stable condition.  Alert and oriented X 3.  Follow up with [facility] as scheduled. 

## 2024-03-25 NOTE — Progress Notes (Signed)
 Labs reviewed with MD. Per MD orders to hold vegzelma  treatment, give 500mL NS over 1 hour, Mag 4g IV over 2 hours, and 2 units of RBCS. Pt updated and agrees to plan. VSS. Pt in stable condition.   Rebecca [name]

## 2024-03-26 LAB — TYPE AND SCREEN
ABO/RH(D): O NEG
Antibody Screen: NEGATIVE
Unit division: 0
Unit division: 0

## 2024-03-26 LAB — BPAM RBC
Blood Product Expiration Date: 202601272359
Blood Product Expiration Date: 202602022359
ISSUE DATE / TIME: 202601231238
ISSUE DATE / TIME: 202601231425
Unit Type and Rh: 9500
Unit Type and Rh: 9500

## 2024-03-28 ENCOUNTER — Inpatient Hospital Stay

## 2024-04-04 ENCOUNTER — Encounter: Payer: Self-pay | Admitting: Oncology

## 2024-04-11 ENCOUNTER — Inpatient Hospital Stay

## 2024-04-11 ENCOUNTER — Inpatient Hospital Stay: Admitting: Oncology

## 2024-04-11 ENCOUNTER — Inpatient Hospital Stay: Attending: Hematology

## 2024-04-13 ENCOUNTER — Inpatient Hospital Stay

## 2024-04-25 ENCOUNTER — Inpatient Hospital Stay

## 2024-04-28 IMAGING — CT CT ABD-PELV W/O CM
2 of 4 series · 15 of 46 positions shown, 17 images · non-contrast
Comparison: Report from CT March 04, 2014 however no comparison
imaging available at time of dictation.

CLINICAL DATA: History of left-sided ovarian cancer status post
chemotherapy and exploratory laparotomy with bilateral salpingo
oophorectomy and infragastric omentectomy on December 19, 2020.

* Tracking Code: BO *
EXAM:
CT ABDOMEN AND PELVIS WITHOUT CONTRAST
TECHNIQUE: Multidetector CT imaging of the abdomen and pelvis was performed
following the standard protocol without IV contrast.
RADIATION DOSE REDUCTION: This exam was performed according to the
departmental dose-optimization program which includes automated
exposure control, adjustment of the mA and/or kV according to
patient size and/or use of iterative reconstruction technique.

[Series 2: axial st · axial · 0.98mm/px · z∈[+869,+1314]mm · 12 of 99 slices shown, 14 images]
[im 5/99  soft-tissue]
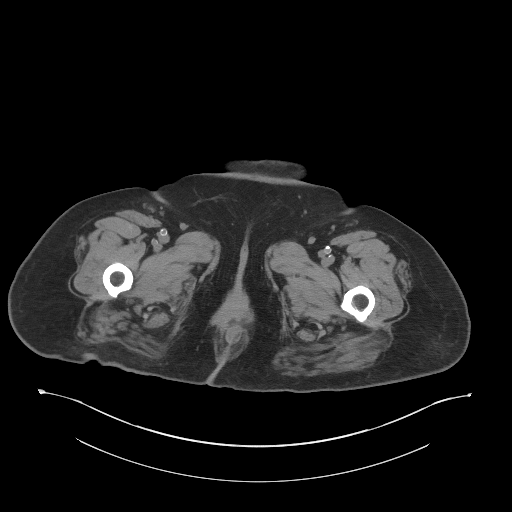
[im 5/99  bone]
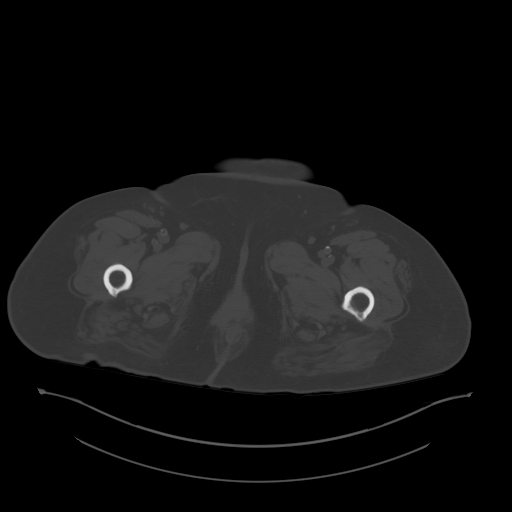
[im 13/99  soft-tissue]
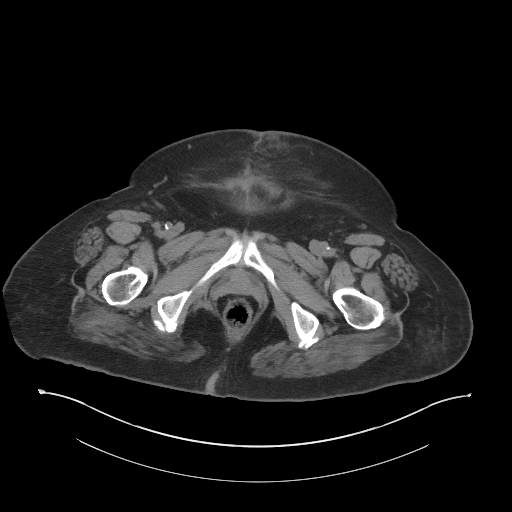
[im 22/99  soft-tissue]
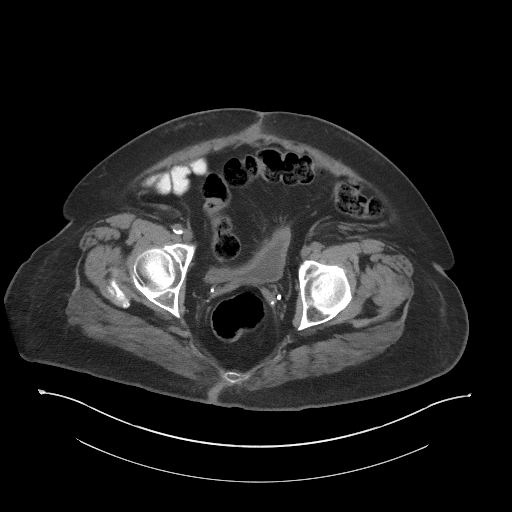
[im 30/99  soft-tissue]
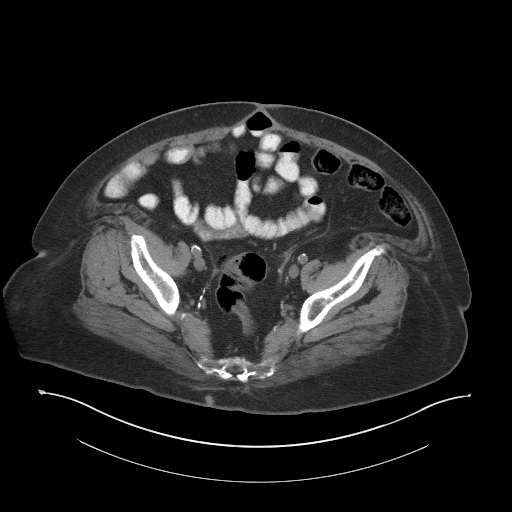
[im 39/99  soft-tissue]
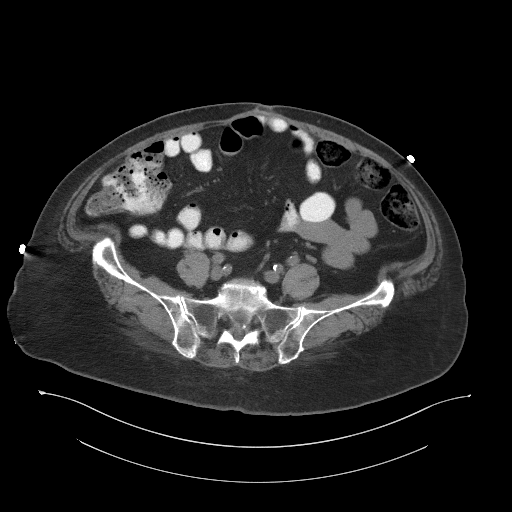
[im 47/99  soft-tissue]
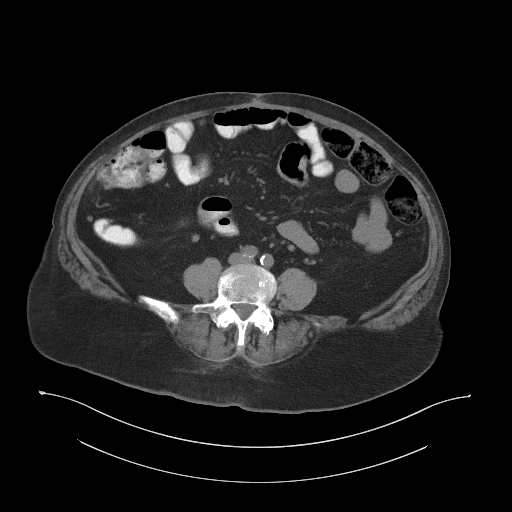
[im 52/99  soft-tissue]
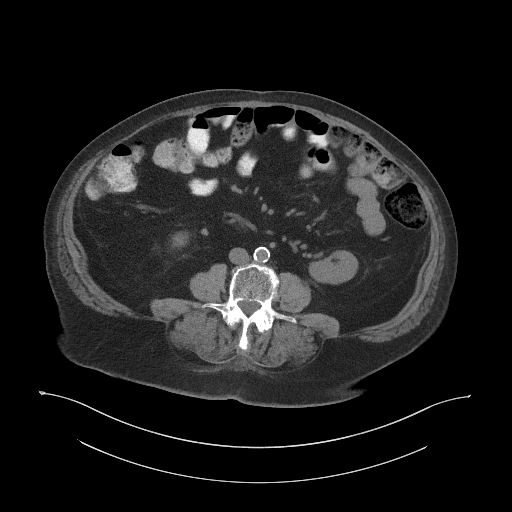
[im 60/99  soft-tissue]
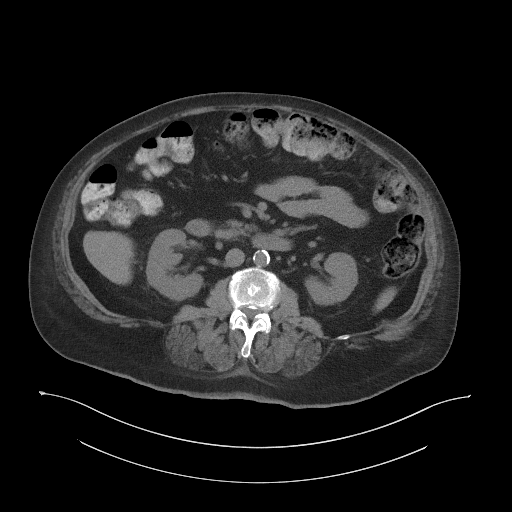
[im 69/99  soft-tissue]
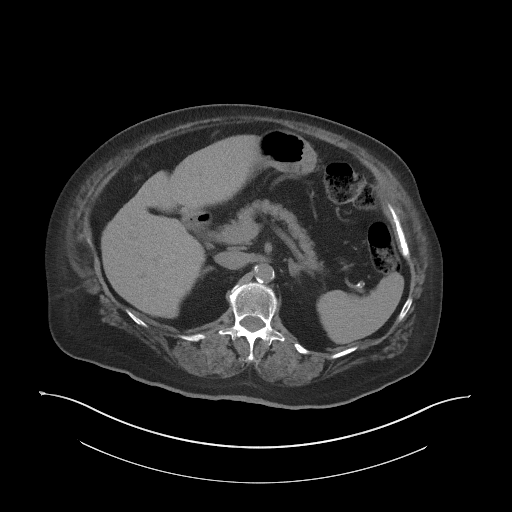
[im 69/99  bone]
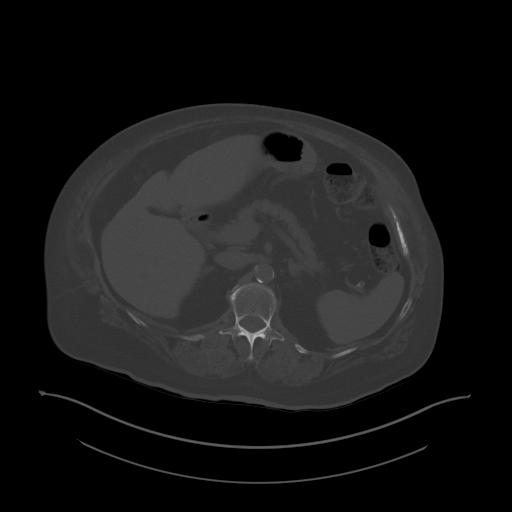
[im 77/99  soft-tissue]
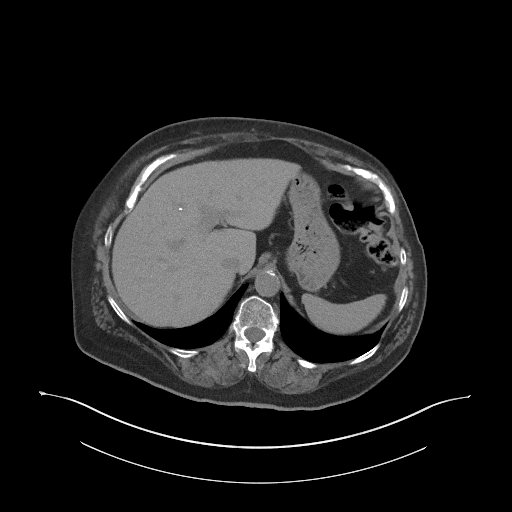
[im 86/99  soft-tissue]
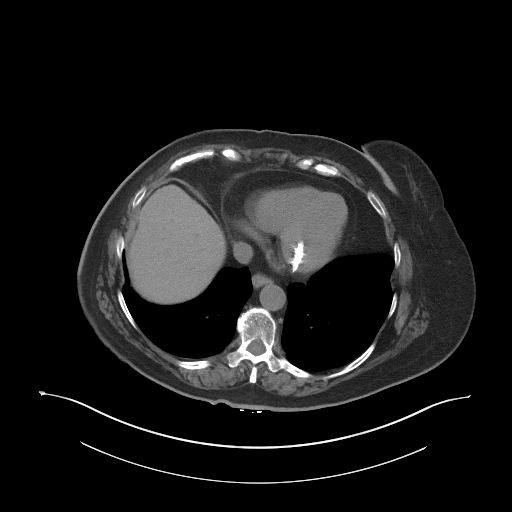
[im 94/99  soft-tissue]
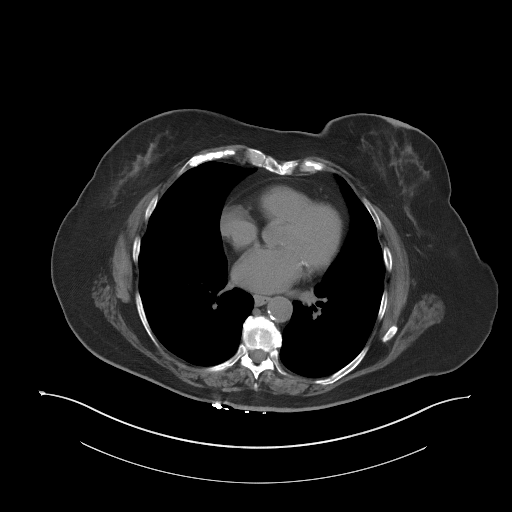

[Series 5: coronal st · coronal · 0.91mm/px · 3 of 109 slices shown]
[im 37/109  soft-tissue]
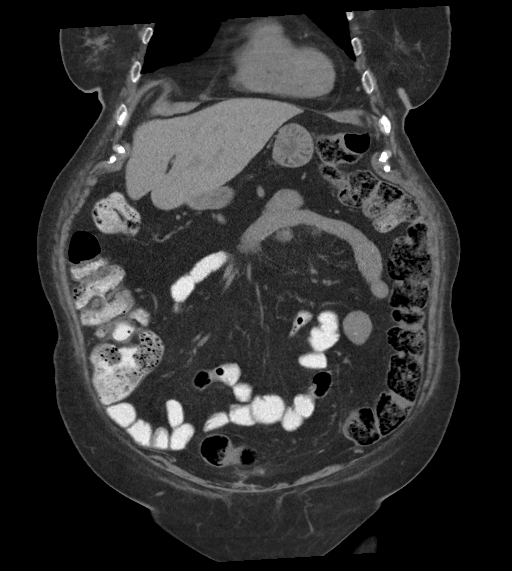
[im 49/109  soft-tissue]
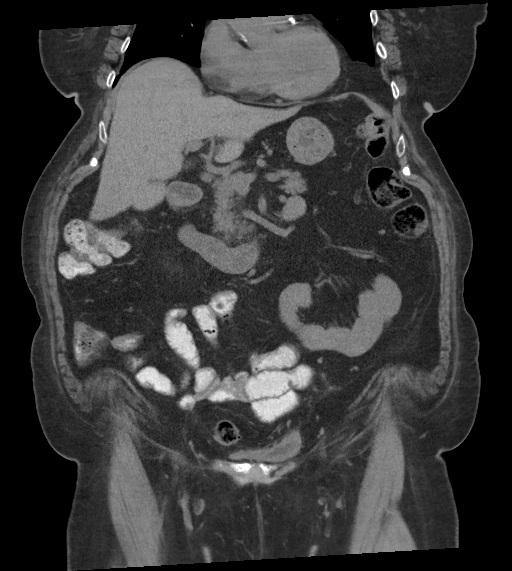
[im 61/109  soft-tissue]
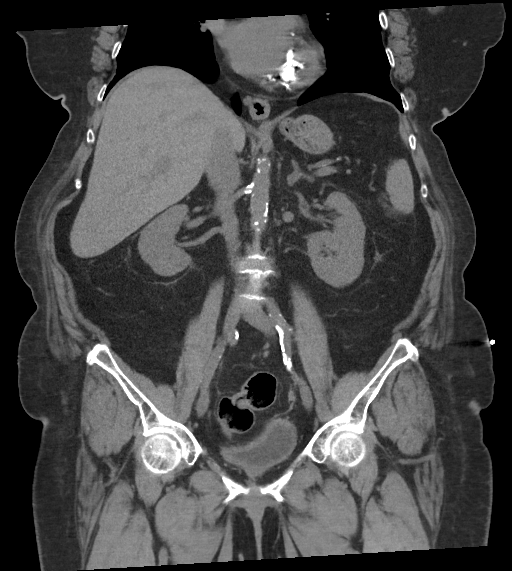

[15 of 46 positions shown; findings below may reference images not displayed]

FINDINGS: Lower chest: No acute abnormality.

Hepatobiliary: Punctate calcification in segment IV a. No suspicious
hepatic lesion on this noncontrast examination. Gallbladder is not
visualized and likely surgically absent. No biliary ductal dilation.

Pancreas: No pancreatic ductal dilation or evidence of acute
inflammation.

Spleen: No splenomegaly or focal splenic lesion.

Adrenals/Urinary Tract: Bilateral adrenal glands appear normal. No
hydronephrosis. No renal, ureteral or bladder calculi identified.
Hyperdense 8 mm right interpolar lesion is incompletely evaluated on
this examination but per report is stable dating back to March 04, 2014 and likely reflects a hemorrhagic/proteinaceous cyst. Urinary
bladder is minimally distended limiting evaluation.

Stomach/Bowel: Radiopaque enteric contrast material traverses the
hepatic flexure. Stomach is nondistended limiting evaluation. No
pathologic dilation small or large bowel. A tiny appendix or
appendiceal remnant is visualized without evidence of inflammation.
Moderate volume of formed stool throughout the colon suggestive of
constipation. No evidence of acute bowel inflammation.

Vascular/Lymphatic: Aortic and branch vessel atherosclerosis without
abdominal aortic aneurysm. No pathologically enlarged abdominal or
pelvic lymph nodes.

Reproductive: Surgical change of hysterectomy and
salpingo-oophorectomy without suspicious nodularity in the surgical
bed.

Other: Prior omentectomy. No discrete peritoneal or omental
nodularity. No significant abdominopelvic free fluid. Postsurgical
change in the anterior abdominal wall with a nonobstructed Khim
type incisional hernia containing a portion of nonobstructed small
bowel.

Musculoskeletal: Diffuse demineralization of bone. No aggressive
lytic or blastic lesion of bone. Multilevel degenerative changes
spine.
IMPRESSION: 1. No evidence of metastatic disease within the abdomen or pelvis on
this noncontrast CT.
2. Surgical change of hysterectomy and salpingo-oophorectomy without
evidence of local recurrence.
3. Postsurgical change in the anterior abdominal wall with a
nonobstructed Khim type incisional hernia containing a portion of
nonobstructed small bowel.
4. Moderate volume of formed stool throughout the colon suggestive
of constipation.
5.  Aortic Atherosclerosis ([id number]-GH0.0).

## 2024-05-09 ENCOUNTER — Inpatient Hospital Stay: Attending: Hematology

## 2024-05-09 ENCOUNTER — Inpatient Hospital Stay: Admitting: Oncology

## 2024-05-09 ENCOUNTER — Inpatient Hospital Stay

## 2024-05-11 ENCOUNTER — Inpatient Hospital Stay

## 2024-05-23 ENCOUNTER — Inpatient Hospital Stay
